# Patient Record
Sex: Female | Born: 1965 | Race: Black or African American | Hispanic: No | Marital: Single | Smoking: Never smoker
Health system: Southern US, Community
[De-identification: ages and names within clinical notes are randomized; demographics above are authoritative.]

## PROBLEM LIST (undated history)

## (undated) DIAGNOSIS — I509 Heart failure, unspecified: Secondary | ICD-10-CM

## (undated) DIAGNOSIS — I10 Essential (primary) hypertension: Secondary | ICD-10-CM

## (undated) DIAGNOSIS — R011 Cardiac murmur, unspecified: Secondary | ICD-10-CM

## (undated) DIAGNOSIS — D689 Coagulation defect, unspecified: Secondary | ICD-10-CM

## (undated) DIAGNOSIS — D649 Anemia, unspecified: Secondary | ICD-10-CM

## (undated) HISTORY — DX: Heart failure, unspecified: I50.9

## (undated) HISTORY — DX: Coagulation defect, unspecified: D68.9

## (undated) HISTORY — DX: Essential (primary) hypertension: I10

## (undated) HISTORY — DX: Anemia, unspecified: D64.9

## (undated) HISTORY — DX: Cardiac murmur, unspecified: R01.1

## (undated) MED FILL — Iron Sucrose Inj 20 MG/ML (Fe Equiv): INTRAVENOUS | Qty: 10 | Status: AC

---

## 2021-02-01 ENCOUNTER — Inpatient Hospital Stay
Admission: EM | Admit: 2021-02-01 | Discharge: 2021-02-10 | DRG: 811 | Disposition: A | Discharge status: Home / Self Care | Payer: Self-pay | Attending: Internal Medicine | Admitting: Internal Medicine

## 2021-02-01 ENCOUNTER — Encounter: Payer: Self-pay | Admitting: Intensive Care

## 2021-02-01 ENCOUNTER — Emergency Department: Payer: Self-pay

## 2021-02-01 ENCOUNTER — Other Ambulatory Visit: Payer: Self-pay

## 2021-02-01 DIAGNOSIS — D509 Iron deficiency anemia, unspecified: Principal | ICD-10-CM | POA: Diagnosis present

## 2021-02-01 DIAGNOSIS — R600 Localized edema: Secondary | ICD-10-CM

## 2021-02-01 DIAGNOSIS — Z20822 Contact with and (suspected) exposure to covid-19: Secondary | ICD-10-CM | POA: Diagnosis present

## 2021-02-01 DIAGNOSIS — R195 Other fecal abnormalities: Secondary | ICD-10-CM

## 2021-02-01 DIAGNOSIS — D696 Thrombocytopenia, unspecified: Secondary | ICD-10-CM | POA: Diagnosis present

## 2021-02-01 DIAGNOSIS — E876 Hypokalemia: Secondary | ICD-10-CM | POA: Diagnosis present

## 2021-02-01 DIAGNOSIS — K7460 Unspecified cirrhosis of liver: Secondary | ICD-10-CM

## 2021-02-01 DIAGNOSIS — R609 Edema, unspecified: Secondary | ICD-10-CM

## 2021-02-01 DIAGNOSIS — I509 Heart failure, unspecified: Secondary | ICD-10-CM

## 2021-02-01 DIAGNOSIS — D6959 Other secondary thrombocytopenia: Secondary | ICD-10-CM | POA: Diagnosis present

## 2021-02-01 DIAGNOSIS — R531 Weakness: Secondary | ICD-10-CM

## 2021-02-01 DIAGNOSIS — I5031 Acute diastolic (congestive) heart failure: Secondary | ICD-10-CM | POA: Diagnosis present

## 2021-02-01 DIAGNOSIS — F1410 Cocaine abuse, uncomplicated: Secondary | ICD-10-CM | POA: Diagnosis present

## 2021-02-01 DIAGNOSIS — Z5900 Homelessness unspecified: Secondary | ICD-10-CM

## 2021-02-01 DIAGNOSIS — Z6833 Body mass index (BMI) 33.0-33.9, adult: Secondary | ICD-10-CM

## 2021-02-01 DIAGNOSIS — R350 Frequency of micturition: Secondary | ICD-10-CM | POA: Diagnosis present

## 2021-02-01 DIAGNOSIS — D649 Anemia, unspecified: Secondary | ICD-10-CM | POA: Diagnosis present

## 2021-02-01 LAB — CBG MONITORING, ED: Glucose-Capillary: 94 mg/dL (ref 70–99)

## 2021-02-01 LAB — LIPASE, BLOOD: Lipase: 38 U/L (ref 11–51)

## 2021-02-01 LAB — RETICULOCYTES
Immature Retic Fract: 27.8 % — ABNORMAL HIGH (ref 2.3–15.9)
RBC.: 2.84 MIL/uL — ABNORMAL LOW (ref 3.87–5.11)
Retic Count, Absolute: 50.6 10*3/uL (ref 19.0–186.0)
Retic Ct Pct: 1.8 % (ref 0.4–3.1)

## 2021-02-01 LAB — CBC
HCT: 19.2 % — ABNORMAL LOW (ref 36.0–46.0)
HCT: 17.5 % — ABNORMAL LOW (ref 36.0–46.0)
Hemoglobin: 5.3 g/dL — ABNORMAL LOW (ref 12.0–15.0)
Hemoglobin: 4.8 g/dL — CL (ref 12.0–15.0)
MCH: 17.8 pg — ABNORMAL LOW (ref 26.0–34.0)
MCH: 17.6 pg — ABNORMAL LOW (ref 26.0–34.0)
MCHC: 27.6 g/dL — ABNORMAL LOW (ref 30.0–36.0)
MCHC: 27.4 g/dL — ABNORMAL LOW (ref 30.0–36.0)
MCV: 64.6 fL — ABNORMAL LOW (ref 80.0–100.0)
MCV: 64.3 fL — ABNORMAL LOW (ref 80.0–100.0)
Platelets: 64 10*3/uL — ABNORMAL LOW (ref 150–400)
Platelets: 101 10*3/uL — ABNORMAL LOW (ref 150–400)
RBC: 2.97 MIL/uL — ABNORMAL LOW (ref 3.87–5.11)
RBC: 2.72 MIL/uL — ABNORMAL LOW (ref 3.87–5.11)
RDW: 31.4 % — ABNORMAL HIGH (ref 11.5–15.5)
RDW: 31.4 % — ABNORMAL HIGH (ref 11.5–15.5)
WBC: 5.8 10*3/uL (ref 4.0–10.5)
WBC: 5.9 10*3/uL (ref 4.0–10.5)
nRBC: 1.4 % — ABNORMAL HIGH (ref 0.0–0.2)
nRBC: 1.2 % — ABNORMAL HIGH (ref 0.0–0.2)

## 2021-02-01 LAB — IRON AND TIBC
Iron: 19 ug/dL — ABNORMAL LOW (ref 28–170)
Saturation Ratios: 4 % — ABNORMAL LOW (ref 10.4–31.8)
TIBC: 508 ug/dL — ABNORMAL HIGH (ref 250–450)
UIBC: 489 ug/dL

## 2021-02-01 LAB — COMPREHENSIVE METABOLIC PANEL
ALT: 13 U/L (ref 0–44)
AST: 20 U/L (ref 15–41)
Albumin: 3.9 g/dL (ref 3.5–5.0)
Alkaline Phosphatase: 39 U/L (ref 38–126)
Anion gap: 8 (ref 5–15)
BUN: 11 mg/dL (ref 6–20)
CO2: 22 mmol/L (ref 22–32)
Calcium: 8.9 mg/dL (ref 8.9–10.3)
Chloride: 108 mmol/L (ref 98–111)
Creatinine, Ser: 0.73 mg/dL (ref 0.44–1.00)
GFR, Estimated: >60 mL/min (ref >60)
Glucose, Bld: 94 mg/dL (ref 70–99)
Potassium: 3.7 mmol/L (ref 3.5–5.1)
Sodium: 138 mmol/L (ref 135–145)
Total Bilirubin: 0.7 mg/dL (ref 0.3–1.2)
Total Protein: 7.5 g/dL (ref 6.5–8.1)

## 2021-02-01 LAB — PROTIME-INR
INR: 1.3 — ABNORMAL HIGH (ref 0.8–1.2)
Prothrombin Time: 15.7 seconds — ABNORMAL HIGH (ref 11.4–15.2)

## 2021-02-01 LAB — TROPONIN I (HIGH SENSITIVITY): Troponin I (High Sensitivity): 8 ng/L (ref <18)

## 2021-02-01 LAB — BRAIN NATRIURETIC PEPTIDE: B Natriuretic Peptide: 334.5 pg/mL — ABNORMAL HIGH (ref 0.0–100.0)

## 2021-02-01 LAB — ABO/RH: ABO/RH(D): B POS

## 2021-02-01 LAB — FERRITIN: Ferritin: 2 ng/mL — ABNORMAL LOW (ref 11–307)

## 2021-02-01 LAB — FOLATE: Folate: 11.7 ng/mL (ref >5.9)

## 2021-02-01 LAB — PREPARE RBC (CROSSMATCH)

## 2021-02-01 MED ORDER — ONDANSETRON HCL 4 MG/2ML IJ SOLN: 4.0000 mg | Freq: Four times a day (QID) | INTRAMUSCULAR | Status: DC | PRN

## 2021-02-01 MED ORDER — PANTOPRAZOLE SODIUM 40 MG IV SOLR
40.0000 mg | Freq: Two times a day (BID) | INTRAVENOUS | Status: DC
  Administered 2021-02-02 – 2021-02-07 (×12): 40 mg via INTRAVENOUS
  Filled 2021-02-01 (×12): qty 40

## 2021-02-01 MED ORDER — ONDANSETRON HCL 4 MG PO TABS: 4.0000 mg | ORAL_TABLET | Freq: Four times a day (QID) | ORAL | Status: DC | PRN

## 2021-02-01 MED ORDER — PANTOPRAZOLE SODIUM 40 MG IV SOLR
40.0000 mg | Freq: Once | INTRAVENOUS | Status: AC
  Administered 2021-02-01: 40 mg via INTRAVENOUS
  Filled 2021-02-01: qty 40

## 2021-02-01 MED ORDER — SODIUM CHLORIDE 0.9% IV SOLUTION
Freq: Once | INTRAVENOUS | Status: AC
  Filled 2021-02-01: qty 250

## 2021-02-01 NOTE — ED Notes (Signed)
Lab coming to recollect green and purple top

## 2021-02-01 NOTE — ED Provider Notes (Signed)
[facility] Emergency Department Provider Note  ____________________________________________  Time seen: Approximately 7:43 PM  I have reviewed the triage vital signs and the nursing notes.   HISTORY  Chief Complaint Foot Swelling    HPI Lauren Hensley is a 55 y.o. female who presents the emergency department for generalized weakness and lower extremity edema.  Patient states that symptoms have been progressing over the past 5 to 6 months.  Patient states that she has slowly had increasing edema both lower extremities.  This is nonpainful.  She states that her legs are now "huge."  Patient has no history of congestive heart failure, is not on a diuretic.  Patient does not weigh herself when eating on a regular basis.  Unsure whether she is been increasing with weight or not.  Patient is also complaining of increasing fatigue.  She states that she is now having difficulty ambulating due to weakness/fatigue.  It is not a true shortness of breath though she states that if she walks any distance she will become short of breath, she states that it is more a tiredness.  She states that she finds that she is sleeping most of the day, sleeps well at night but then will wake up and up through most of the day as well.  Patient denies any dark or tarry stools.  No active visualized         History reviewed. No pertinent past medical history.  Patient Active Problem List   Diagnosis Date Noted  . Symptomatic anemia 02/01/2021    History reviewed. No pertinent surgical history.  Prior to Admission medications   Not on File    Allergies Patient has no known allergies.  History reviewed. No pertinent family history.  Social History Social History   Tobacco Use  . Smoking status: Never Smoker  . Smokeless tobacco: Never Used  Substance Use Topics  . Alcohol use: Yes    Comment: occ  . Drug use: Yes    Types: Marijuana, Cocaine     Review of Systems   Constitutional: No fever/chills.  Positive for generalized weakness Eyes: No visual changes. No discharge ENT: No upper respiratory complaints. Cardiovascular: no chest pain.  Increased peripheral edema Respiratory: no cough. No SOB. Gastrointestinal: No abdominal pain.  No nausea, no vomiting.  No diarrhea.  No constipation. Genitourinary: Negative for dysuria. No hematuria Musculoskeletal: Negative for musculoskeletal pain. Skin: Negative for rash, abrasions, lacerations, ecchymosis. Neurological: Negative for headaches, focal weakness or numbness.  10 System ROS otherwise negative.  ____________________________________________   PHYSICAL EXAM:  VITAL SIGNS: ED Triage Vitals  Enc Vitals Group     BP 02/01/21 1713 (!) 151/87     Pulse Rate 02/01/21 1713 (!) 114     Resp 02/01/21 1713 18     Temp 02/01/21 1715 98 F (36.7 C)     Temp Source 02/01/21 1715 Oral     SpO2 02/01/21 1713 100 %     Weight 02/01/21 1713 230 lb (104.3 kg)     Height 02/01/21 1713 5\' 9"  (1.753 m)     Head Circumference --      Peak Flow --      Pain Score 02/01/21 1713 4     Pain Loc --      Pain Edu? --      Excl. in [place]? --      Constitutional: Alert and oriented. Well appearing and in no acute distress. Eyes: Conjunctivae are normal. PERRL. EOMI. Head: Atraumatic.  ENT:      Ears:       Nose: No congestion/rhinnorhea.      Mouth/Throat: Mucous membranes are moist.  Neck: No stridor.    Cardiovascular: Normal rate, regular rhythm. Normal S1 and S2.  Good peripheral circulation.  Visualization of lower extremities reveals bilateral lower extremity edema, 3+.  Dorsalis pedis pulse appreciated bilateral lower extremity.  Sensation intact and equal bilateral lower extremity. Respiratory: Normal respiratory effort without tachypnea or retractions. Lungs CTAB. Good air entry to the bases with no decreased or absent breath sounds. Musculoskeletal: Full range of motion to all extremities. No gross  deformities appreciated. Neurologic:  Normal speech and language. No gross focal neurologic deficits are appreciated.  Cranial nerves II through XII grossly intact. Skin:  Skin is warm, dry and intact. No rash noted. Psychiatric: Mood and affect are normal. Speech and behavior are normal. Patient exhibits appropriate insight and judgement.   ____________________________________________   LABS (all labs ordered are listed, but only abnormal results are displayed)  Labs Reviewed  CBC - Abnormal; Notable for the following components:      Result Value   RBC 2.97 (*)    Hemoglobin 5.3 (*)    HCT 19.2 (*)    MCV 64.6 (*)    MCH 17.8 (*)    MCHC 27.6 (*)    RDW 31.4 (*)    Platelets 64 (*)    nRBC 1.4 (*)    All other components within normal limits  PROTIME-INR - Abnormal; Notable for the following components:   Prothrombin Time 15.7 (*)    INR 1.3 (*)    All other components within normal limits  COMPREHENSIVE METABOLIC PANEL  LIPASE, BLOOD  URINALYSIS, COMPLETE (UACMP) WITH MICROSCOPIC  URINE DRUG SCREEN, QUALITATIVE ([facility] ONLY)  BRAIN NATRIURETIC PEPTIDE  VITAMIN B12  FOLATE  IRON AND TIBC  FERRITIN  RETICULOCYTES  CBG MONITORING, ED  TYPE AND SCREEN  PREPARE RBC (CROSSMATCH)  ABO/RH  TROPONIN I (HIGH SENSITIVITY)   ____________________________________________  EKG   ____________________________________________  RADIOLOGY I personally viewed and evaluated these images as part of my medical decision making, as well as reviewing the written report by the radiologist.  ED Provider Interpretation: No pulmonary edema identified.  Mild vascular congestion and cardiomegaly.  DG Chest 2 View  Result Date: 02/01/2021 CLINICAL DATA:  Weakness and peripheral edema EXAM: CHEST - 2 VIEW COMPARISON:  None. FINDINGS: Cardiac shadow is enlarged. Mild vascular congestion is noted without parenchymal edema. No sizable effusion is seen. No infiltrate is noted. Degenerative change  of the thoracic spine is seen. IMPRESSION: Mild vascular congestion.  No other focal abnormality is noted. Electronically Signed   By: Inez Catalina M.D.   On: 02/01/2021 20:07    ____________________________________________    PROCEDURES  Procedure(s) performed:    Procedures    Medications  0.9 %  sodium chloride infusion (Manually program via Guardrails IV Fluids) (has no administration in time range)     ____________________________________________   INITIAL IMPRESSION / ASSESSMENT AND PLAN / ED COURSE  Pertinent labs & imaging results that were available during my care of the patient were reviewed by me and considered in my medical decision making (see chart for details).  Review of the [place] CSRS was performed in accordance of the [place] prior to dispensing any controlled drugs.           Patient's diagnosis is consistent with symptomatic anemia, guaiac positive stool, peripheral edema.  Patient presented to emergency  department complaining of generalized weakness, peripheral edema.  Symptoms have been progressing over the past 5 to 6 months.  Patient states that she feels very tired, sleeps through the night, sleeps most of the day as well.  Patient states that in the very remote past when she was a teenager she needed iron supplements due to iron deficiency anemia.  She is not taken supplements and years and has not had any ongoing issues with anemia.  She denied any dark or tarry stools.  No visible blood in her stool.  Patient denied any abdominal pain.  She was also endorsing some increasing lower extremity edema.  No history of CHF.  No diuretic use.  Findings revealed peripheral edema likely concerning for CHF.  Slight cardiomegaly and vascular congestion as well.  No evidence of pulmonary edema.  No concerning EKG or lab changes in regards to the patient's heart.  Patient was found to be anemic at 5.3.  Patient will be transfused here in the emergency department.  She was  guaiac positive but no evidence of frank blood or dark tarry stools on physical exam..  Patient care will be transferred to hospital service at this time for admission for further work-up of symptomatic anemia and peripheral edema.    ____________________________________________  FINAL CLINICAL IMPRESSION(S) / ED DIAGNOSES  Final diagnoses:  Symptomatic anemia  Stool guaiac positive  Peripheral edema      NEW MEDICATIONS STARTED DURING THIS VISIT:  ED Discharge Orders    None          This chart was dictated using voice recognition software/Dragon. Despite best efforts to proofread, errors can occur which can change the meaning. Any change was purely unintentional.    Darletta Moll, PA-C 02/01/21 2114    Duffy Bruce, MD 02/02/21 [phone]

## 2021-02-01 NOTE — H&P (Addendum)
History and Physical   Lauren Hensley WVP:[id number] DOB: May 25, 1966 DOA: 02/01/2021  Referring MD/NP/PA: Dr. Ellender Hose  PCP: Pcp, No   Outpatient Specialists: None  Patient coming from: Home  Chief Complaint: Lower extremity edema and exertional dyspnea  HPI: Lauren Hensley is a 55 y.o. female with medical history significant of no significant past medical history patient has not been seeing any doctors and has not been followed on a regular basis.  Patient presented to the ER with progressive lower extremity edema over the last couple of weeks which has now progressed to shortness of breath initially with exertion but now even at rest.  She has been out progressively weak and tired all the time.  She also has had mild cough.  Patient's family have been pushing her to go see a doctor but has not done so.  She denied any sick contacts.  Patient came to the ER where she was seen and evaluated and found to have a hemoglobin of 5.3.  She also has bilateral lower extremity edema consistent with reported CHF.  Patient found to be occult blood positive with brown stool.  No prior colonoscopies.  She is being admitted for further work-up.  ED Course: Temperature 98 blood pressure 157/89 pulse 140 respirate 23 oxygen sat 98% on room air.  White count is 5.8 hemoglobin 5.3 and platelets 64.  Sodium 138 and the rest of the chemistry appeared to be within normal.  INR 1.3.  Fecal occult blood test was positive.  Patient is initiated on blood transfusion will be admitted to the hospital for further evaluation and treatment.  Review of Systems: As per HPI otherwise 10 point review of systems negative.    History reviewed. No pertinent past medical history.  History reviewed. No pertinent surgical history.   reports that she has never smoked. She has never used smokeless tobacco. She reports current alcohol use. She reports current drug use. Drugs: Marijuana and Cocaine.  No Known Allergies  History reviewed.  No pertinent family history.   Prior to Admission medications   Not on File    Physical Exam: Vitals:   02/01/21 2100 02/01/21 2130 02/01/21 2233 02/01/21 2319  BP: (!) 143/85 (!) 157/89 136/84 (!) 150/88  Pulse: 79 63 79 81  Resp: 20 (!) 21 18 20   Temp:   97.8 F (36.6 C) 98 F (36.7 C)  TempSrc:   Oral Oral  SpO2: 100% 100% 100% 100%  Weight:      Height:          Constitutional: Acutely ill looking, no distress Vitals:   02/01/21 2100 02/01/21 2130 02/01/21 2233 02/01/21 2319  BP: (!) 143/85 (!) 157/89 136/84 (!) 150/88  Pulse: 79 63 79 81  Resp: 20 (!) 21 18 20   Temp:   97.8 F (36.6 C) 98 F (36.7 C)  TempSrc:   Oral Oral  SpO2: 100% 100% 100% 100%  Weight:      Height:       Eyes: PERRL, lids and conjunctivae normal ENMT: Mucous membranes are moist. Posterior pharynx clear of any exudate or lesions.Normal dentition.  Neck: normal, supple, no masses, no thyromegaly Respiratory: clear to auscultation bilaterally, no wheezing, diffuse lower crackles normal respiratory effort. No accessory muscle use.  Cardiovascular: Regular rate and rhythm, no murmurs / rubs / gallops.  2+ peripheral edema. 2+ pedal pulses. No carotid bruits.  Abdomen: no tenderness, no masses palpated. No hepatosplenomegaly. Bowel sounds positive.  Musculoskeletal: no clubbing / cyanosis. No joint  deformity upper and lower extremities. Good ROM, no contractures. Normal muscle tone.  Skin: no rashes, lesions, ulcers. No induration Neurologic: CN 2-12 grossly intact. Sensation intact, DTR normal. Strength 5/5 in all 4.  Psychiatric: Normal judgment and insight. Alert and oriented x 3. Normal mood.     Labs on Admission: I have personally reviewed following labs and imaging studies  CBC: Recent Labs  Lab 02/01/21 1756  WBC 5.8  HGB 5.3*  HCT 19.2*  MCV 64.6*  PLT 64*   Basic Metabolic Panel: Recent Labs  Lab 02/01/21 1756  NA 138  K 3.7  CL 108  CO2 22  GLUCOSE 94  BUN 11   CREATININE 0.73  CALCIUM 8.9   GFR: Estimated Creatinine Clearance: 105.7 mL/min (by C-G formula based on SCr of 0.73 mg/dL). Liver Function Tests: Recent Labs  Lab 02/01/21 1756  AST 20  ALT 13  ALKPHOS 39  BILITOT 0.7  PROT 7.5  ALBUMIN 3.9   Recent Labs  Lab 02/01/21 1756  LIPASE 38   No results for input(s): AMMONIA in the last 168 hours. Coagulation Profile: Recent Labs  Lab 02/01/21 2021  INR 1.3*   Cardiac Enzymes: No results for input(s): CKTOTAL, CKMB, CKMBINDEX, TROPONINI in the last 168 hours. BNP (last 3 results) No results for input(s): PROBNP in the last 8760 hours. HbA1C: No results for input(s): HGBA1C in the last 72 hours. CBG: Recent Labs  Lab 02/01/21 1721  GLUCAP 94   Lipid Profile: No results for input(s): CHOL, HDL, LDLCALC, TRIG, CHOLHDL, LDLDIRECT in the last 72 hours. Thyroid Function Tests: No results for input(s): TSH, T4TOTAL, FREET4, T3FREE, THYROIDAB in the last 72 hours. Anemia Panel: Recent Labs    02/01/21 1756 02/01/21 2021  FOLATE  --  11.7  FERRITIN  --  2*  TIBC  --  508*  IRON  --  19*  RETICCTPCT 1.8  --    Urine analysis: No results found for: COLORURINE, APPEARANCEUR, LABSPEC, PHURINE, GLUCOSEU, HGBUR, BILIRUBINUR, KETONESUR, PROTEINUR, UROBILINOGEN, NITRITE, LEUKOCYTESUR Sepsis Labs: @LABRCNTIP (procalcitonin:4,lacticidven:4) )No results found for this or any previous visit (from the past 240 hour(s)).   Radiological Exams on Admission: DG Chest 2 View  Result Date: 02/01/2021 CLINICAL DATA:  Weakness and peripheral edema EXAM: CHEST - 2 VIEW COMPARISON:  None. FINDINGS: Cardiac shadow is enlarged. Mild vascular congestion is noted without parenchymal edema. No sizable effusion is seen. No infiltrate is noted. Degenerative change of the thoracic spine is seen. IMPRESSION: Mild vascular congestion.  No other focal abnormality is noted. Electronically Signed   By: Inez Catalina M.D.   On: 02/01/2021 20:07       Assessment/Plan Principal Problem:   Symptomatic anemia Active Problems:   Weakness   Acute CHF (congestive heart failure) (HCC)   Guaiac + stool   Thrombocytopenia (HCC)     #1 symptomatic anemia: Most likely secondary to slow blood loss from the GI tract.  Patient also had thrombocytopenia and could be bone marrow related.  Her iron indices were checked showing total iron of only 19.  Is 4% saturation with a ferritin of 2 showing severe iron deficiency.  No heavy periods.  At this point will transfuse 2 units of packed red blood cells.  GI consult.  Initiate IV Protonix.  Differentials include colon cancer diverticular disease and some chronic colitis.  #2 thrombocytopenia: No obvious cause.  Evaluate patient for possible bone marrow disease but less likely.  Her anemia is microcytic consistent with iron deficiency.  MCV of 64.  #3 acute CHF: Evidence of pulmonary edema on chest x-ray.  Will transfuse first.  If patient continues to have respiratory symptoms we will diurese then.  #4 generalized weakness: Most likely due to the anemia.  Transfuse and get PT  #5 guaiac positive stool: Most likely the source of her anemia.  Get GI consult.  Thrombocytopenia may also be the cause of these wet positive stool with brown stool.   DVT prophylaxis: SCD Code Status: Full Family Communication: No family at bedside Disposition Plan: Home Consults called: Gastroenterology, Dr. Haig Prophet Admission status: Inpatient  Severity of Illness: The appropriate patient status for this patient is INPATIENT. Inpatient status is judged to be reasonable and necessary in order to provide the required intensity of service to ensure the patient's safety. The patient's presenting symptoms, physical exam findings, and initial radiographic and laboratory data in the context of their chronic comorbidities is felt to place them at high risk for further clinical deterioration. Furthermore, it is not  anticipated that the patient will be medically stable for discharge from the hospital within 2 midnights of admission. The following factors support the patient status of inpatient.   " The patient's presenting symptoms include generalized weakness with lower extremity edema. " The worrisome physical exam findings include lower extremity edema and pallor. " The initial radiographic and laboratory data are worrisome because of hemoglobin 5.3. " The chronic co-morbidities include none.   * I certify that at the point of admission it is my clinical judgment that the patient will require inpatient hospital care spanning beyond 2 midnights from the point of admission due to high intensity of service, high risk for further deterioration and high frequency of surveillance required.Barbette Merino MD [facility] Pager [phone]  If 7PM-7AM, please contact night-coverage [email or web address] Password [facility]  02/01/2021, 11:29 PM

## 2021-02-01 NOTE — ED Triage Notes (Addendum)
Patient presents with bilateral feet swelling for months. Reports some pain in legs/feet. Has never been seen for this issue. Denies cp or sob. Reports craving ice and drinking lots of water lately. Patient admits to cocaine and marijuana use

## 2021-02-01 NOTE — ED Notes (Signed)
Patient transported to X-ray 

## 2021-02-01 NOTE — ED Notes (Signed)
Attempted IV with no success, type and screen obtained.

## 2021-02-02 ENCOUNTER — Inpatient Hospital Stay: Payer: Self-pay

## 2021-02-02 LAB — COMPREHENSIVE METABOLIC PANEL
ALT: 12 U/L (ref 0–44)
AST: 15 U/L (ref 15–41)
Albumin: 3.3 g/dL — ABNORMAL LOW (ref 3.5–5.0)
Alkaline Phosphatase: 36 U/L — ABNORMAL LOW (ref 38–126)
Anion gap: 7 (ref 5–15)
BUN: 9 mg/dL (ref 6–20)
CO2: 22 mmol/L (ref 22–32)
Calcium: 8.5 mg/dL — ABNORMAL LOW (ref 8.9–10.3)
Chloride: 109 mmol/L (ref 98–111)
Creatinine, Ser: 0.49 mg/dL (ref 0.44–1.00)
GFR, Estimated: >60 mL/min (ref >60)
Glucose, Bld: 89 mg/dL (ref 70–99)
Potassium: 3.4 mmol/L — ABNORMAL LOW (ref 3.5–5.1)
Sodium: 138 mmol/L (ref 135–145)
Total Bilirubin: 2 mg/dL — ABNORMAL HIGH (ref 0.3–1.2)
Total Protein: 6.6 g/dL (ref 6.5–8.1)

## 2021-02-02 LAB — CBC
HCT: 19.9 % — ABNORMAL LOW (ref 36.0–46.0)
HCT: 20.4 % — ABNORMAL LOW (ref 36.0–46.0)
HCT: 21.8 % — ABNORMAL LOW (ref 36.0–46.0)
Hemoglobin: 5.8 g/dL — ABNORMAL LOW (ref 12.0–15.0)
Hemoglobin: 6.2 g/dL — ABNORMAL LOW (ref 12.0–15.0)
Hemoglobin: 6.2 g/dL — ABNORMAL LOW (ref 12.0–15.0)
MCH: 19.9 pg — ABNORMAL LOW (ref 26.0–34.0)
MCH: 20.5 pg — ABNORMAL LOW (ref 26.0–34.0)
MCH: 19.7 pg — ABNORMAL LOW (ref 26.0–34.0)
MCHC: 29.1 g/dL — ABNORMAL LOW (ref 30.0–36.0)
MCHC: 30.4 g/dL (ref 30.0–36.0)
MCHC: 28.4 g/dL — ABNORMAL LOW (ref 30.0–36.0)
MCV: 68.2 fL — ABNORMAL LOW (ref 80.0–100.0)
MCV: 67.5 fL — ABNORMAL LOW (ref 80.0–100.0)
MCV: 69.2 fL — ABNORMAL LOW (ref 80.0–100.0)
Platelets: 98 10*3/uL — ABNORMAL LOW (ref 150–400)
Platelets: 63 10*3/uL — ABNORMAL LOW (ref 150–400)
Platelets: 85 10*3/uL — ABNORMAL LOW (ref 150–400)
RBC: 2.92 MIL/uL — ABNORMAL LOW (ref 3.87–5.11)
RBC: 3.02 MIL/uL — ABNORMAL LOW (ref 3.87–5.11)
RBC: 3.15 MIL/uL — ABNORMAL LOW (ref 3.87–5.11)
RDW: 32.9 % — ABNORMAL HIGH (ref 11.5–15.5)
RDW: 32 % — ABNORMAL HIGH (ref 11.5–15.5)
RDW: 32.1 % — ABNORMAL HIGH (ref 11.5–15.5)
WBC: 6.3 10*3/uL (ref 4.0–10.5)
WBC: 5.7 10*3/uL (ref 4.0–10.5)
WBC: 6.7 10*3/uL (ref 4.0–10.5)
nRBC: 1 % — ABNORMAL HIGH (ref 0.0–0.2)
nRBC: 1.1 % — ABNORMAL HIGH (ref 0.0–0.2)
nRBC: 2.4 % — ABNORMAL HIGH (ref 0.0–0.2)

## 2021-02-02 LAB — PREPARE RBC (CROSSMATCH)

## 2021-02-02 LAB — URINALYSIS, COMPLETE (UACMP) WITH MICROSCOPIC
Bacteria, UA: NONE SEEN
Bilirubin Urine: NEGATIVE
Glucose, UA: NEGATIVE mg/dL
Ketones, ur: NEGATIVE mg/dL
Nitrite: NEGATIVE
Protein, ur: NEGATIVE mg/dL
Specific Gravity, Urine: 1.006 (ref 1.005–1.030)
pH: 6 (ref 5.0–8.0)

## 2021-02-02 LAB — HEPATITIS A ANTIBODY, TOTAL: hep A Total Ab: NONREACTIVE

## 2021-02-02 LAB — URINE DRUG SCREEN, QUALITATIVE (ARMC ONLY)
Amphetamines, Ur Screen: NOT DETECTED
Barbiturates, Ur Screen: NOT DETECTED
Benzodiazepine, Ur Scrn: NOT DETECTED
Cannabinoid 50 Ng, Ur ~~LOC~~: NOT DETECTED
Cocaine Metabolite,Ur ~~LOC~~: POSITIVE — AB
MDMA (Ecstasy)Ur Screen: NOT DETECTED
Methadone Scn, Ur: NOT DETECTED
Opiate, Ur Screen: NOT DETECTED
Phencyclidine (PCP) Ur S: NOT DETECTED
Tricyclic, Ur Screen: NOT DETECTED

## 2021-02-02 LAB — HEPATITIS B CORE ANTIBODY, TOTAL: Hep B Core Total Ab: NONREACTIVE

## 2021-02-02 LAB — HIV ANTIBODY (ROUTINE TESTING W REFLEX): HIV Screen 4th Generation wRfx: NONREACTIVE

## 2021-02-02 LAB — HEPATITIS B SURFACE ANTIGEN: Hepatitis B Surface Ag: NONREACTIVE

## 2021-02-02 LAB — VITAMIN B12: Vitamin B-12: 421 pg/mL (ref 180–914)

## 2021-02-02 MED ORDER — POTASSIUM CHLORIDE CRYS ER 20 MEQ PO TBCR
30.0000 meq | EXTENDED_RELEASE_TABLET | Freq: Once | ORAL | Status: AC
  Administered 2021-02-02: 30 meq via ORAL
  Filled 2021-02-02: qty 1

## 2021-02-02 MED ORDER — PEG 3350-KCL-NA BICARB-NACL 420 G PO SOLR
4000.0000 mL | Freq: Once | ORAL | Status: AC
  Administered 2021-02-02: 4000 mL via ORAL
  Filled 2021-02-02: qty 4000

## 2021-02-02 MED ORDER — SODIUM CHLORIDE 0.9% IV SOLUTION: Freq: Once | INTRAVENOUS | Status: AC

## 2021-02-02 MED ORDER — FUROSEMIDE 10 MG/ML IJ SOLN
20.0000 mg | Freq: Once | INTRAMUSCULAR | Status: AC
  Administered 2021-02-02: 20 mg via INTRAVENOUS
  Filled 2021-02-02: qty 4

## 2021-02-02 NOTE — Plan of Care (Signed)
  Problem: Education: Goal: Ability to identify signs and symptoms of gastrointestinal bleeding will improve Outcome: Progressing   Problem: Bowel/Gastric: Goal: Will show no signs and symptoms of gastrointestinal bleeding Outcome: Progressing   Problem: Fluid Volume: Goal: Will show no signs and symptoms of excessive bleeding Outcome: Progressing   Problem: Clinical Measurements: Goal: Diagnostic test results will improve Outcome: Progressing   Problem: Safety: Goal: Ability to remain free from injury will improve Outcome: Progressing

## 2021-02-02 NOTE — Progress Notes (Signed)
PROGRESS NOTE    Lauren Hensley  JHE:[id number] DOB: 1966-05-19 DOA: 02/01/2021 PCP: Pcp, No   Chief Complaint  Patient presents with  . Foot Swelling  Brief Narrative: 55 year old female no known past medical history but has not been seeing any doctors on regular basis presented to the ED with progressive lower extremity edema for few weeks along with shortness of breath with exertion and at rest progressive weakness.  Family has been pushing her to go to see her doctor about seeing has not done so. In the ED found to have severe microcytic anemia with iron deficiency, thrombocytopenia along with bilateral lower extremity edema, occult blood positive in the stool.  She has had no prior colonoscopies. Patient was admitted for further management transfusion.  Subjective: Seen/examined Received 2 units PRBC overnight, last one at 6.30 am. Cbc pending just now reports stool was positive in rectal exam in ED. Resting well, on RA, no chest pain or shortness of breath at rest.  Assessment & Plan:  Severe microcytic symptomatic anemia Severe iron deficiency anemia-ferritin at 2 Hemoccult positive stool per EDP Received 2 units PRBC overnight, last one at 6.30 am.  Check H&H 12 posttransfusion and transfuse if still < 7 gm. GI is consulted.will likely need further imaging studies-ultrasound abdomen pending.Continue on PPI, CLD. Monitor serila h/h. She still gets monthly menstruation and states she gets somewhat heavy after few days. Denies regular nsaid use, never had egd/colonoscopy.  Repeat H&H came back at 6.2 g we will transfuse 2 additional PRBC, Lasix 20 mg x 1 in between transfusion. Recent Labs  Lab 02/01/21 1756 02/01/21 2304 02/02/21 0502 02/02/21 0844  HGB 5.3* 4.8* 5.8* 6.2*  HCT 19.2* 17.5* 19.9* 20.4*   Generalized weakness  from anemia.  Ambulatory.  Lower extremity swelling for months Dyspnea on exertion: Troponin was negative.  BNP somewhat elevated 334, chest  x-ray-showed mild vascular congestion. Suspect from anemia. Check echo to eval LVEF, to rule out CHF.  Hypokalemia:will replete po. Mildly elevated total bili.  Monitor Urine drug screen positive with cocaine, will need extensive counseling  Thrombocytopenia likely in the setting of iron deficiency: Avoid anticoagulants monitor platelet count  Morbid obesity with BMI 33.  Will benefit weight loss PCP follow-up.  Diet Order            Diet clear liquid Room service appropriate? Yes; Fluid consistency: Thin  Diet effective now                Patient's Body mass index is 33.97 kg/m. DVT prophylaxis: SCDs Start: 02/01/21 2245 Code Status:   Code Status: Full Code  Family Communication: plan of care discussed with patient at bedside.  Status is: Inpatient Remains inpatient appropriate because:Ongoing diagnostic testing needed not appropriate for outpatient work up, IV treatments appropriate due to intensity of illness or inability to take PO and Inpatient level of care appropriate due to severity of illness  Dispo: The patient is from: Home              Anticipated d/c is to: Home              Patient currently is not medically stable to d/c.   Difficult to place patient No Unresulted Labs (From admission, onward)          Start     Ordered   02/03/21 1856  Basic metabolic panel  Daily,   R     Question:  Specimen collection method  Answer:  Lab=Lab collect  02/02/21 0815   02/02/21 1113  Prepare RBC (crossmatch)  (Adult Blood Administration - Red Blood Cells)  Once,   R       Question Answer Comment  # of Units 2 units   Transfusion Indications Symptomatic Anemia   Number of Units to Keep Ahead NO units ahead   If emergent release call blood bank Not emergent release      02/02/21 1113   02/02/21 0900  CBC  Now then every 6 hours,   TIMED     Question:  Specimen collection method  Answer:  Lab=Lab collect   02/02/21 0815        Medications reviewed:  Scheduled  Meds: . sodium chloride   Intravenous Once  . pantoprazole (PROTONIX) IV  40 mg Intravenous Q12H   Continuous Infusions:  Consultants: GI  Procedures:see note  Antimicrobials: Anti-infectives (From admission, onward)   None     Culture/Microbiology No results found for: SDES, SPECREQUEST, CULT, REPTSTATUS  Other culture-see note  Objective: Vitals: Today's Vitals   02/02/21 0318 02/02/21 0457 02/02/21 0637 02/02/21 0835  BP: (!) 150/85 (!) 142/70 (!) 145/96 132/69  Pulse: 82 77 (!) 103 78  Resp: 18 17 18 17   Temp: 98.1 F (36.7 C) 98.6 F (37 C) 98.4 F (36.9 C) 98.7 F (37.1 C)  TempSrc: Oral  Oral   SpO2: 100% 100% 100% 95%  Weight:      Height:      PainSc:        Intake/Output Summary (Last 24 hours) at 02/02/2021 1113 Last data filed at 02/02/2021 1040 Gross per 24 hour  Intake 1200 ml  Output --  Net 1200 ml   Filed Weights   02/01/21 1713  Weight: 104.3 kg   Weight change:   Intake/Output from previous day: 05/07 0701 - 05/08 0700 In: 780 [I.V.:10; Blood:770] Out: -  Intake/Output this shift: Total I/O In: 420 [P.O.:420] Out: -  Filed Weights   02/01/21 1713  Weight: 104.3 kg    Examination: General exam: AAO x3, obese,older than stated age, weak appearing. HEENT:Oral mucosa moist, Ear/Nose WNL grossly,dentition normal. Respiratory system: bilaterally diminished,no crackles no use of accessory muscle, non tender. Cardiovascular system: S1 & S2 +,RRR No JVD. Gastrointestinal system: Abdomen soft, NT,ND, BS+. Nervous System:Alert, awake, moving extremities Extremities:  LE pitting edema, distal peripheral pulses palpable.  Skin: No rashes,no icterus. MSK: Normal muscle bulk,tone, power  Data Reviewed: I have personally reviewed following labs and imaging studies CBC: Recent Labs  Lab 02/01/21 1756 02/01/21 2304 02/02/21 0502 02/02/21 0844  WBC 5.8 5.9 6.3 5.7  HGB 5.3* 4.8* 5.8* 6.2*  HCT 19.2* 17.5* 19.9* 20.4*  MCV 64.6* 64.3*  68.2* 67.5*  PLT 64* 101* 98* 63*   Basic Metabolic Panel: Recent Labs  Lab 02/01/21 1756 02/02/21 0502  NA 138 138  K 3.7 3.4*  CL 108 109  CO2 22 22  GLUCOSE 94 89  BUN 11 9  CREATININE 0.73 0.49  CALCIUM 8.9 8.5*   GFR: Estimated Creatinine Clearance: 105.7 mL/min (by C-G formula based on SCr of 0.49 mg/dL). Liver Function Tests: Recent Labs  Lab 02/01/21 1756 02/02/21 0502  AST 20 15  ALT 13 12  ALKPHOS 39 36*  BILITOT 0.7 2.0*  PROT 7.5 6.6  ALBUMIN 3.9 3.3*   Recent Labs  Lab 02/01/21 1756  LIPASE 38   No results for input(s): AMMONIA in the last 168 hours. Coagulation Profile: Recent Labs  Lab 02/01/21 2021  INR 1.3*   Cardiac Enzymes: No results for input(s): CKTOTAL, CKMB, CKMBINDEX, TROPONINI in the last 168 hours. BNP (last 3 results) No results for input(s): PROBNP in the last 8760 hours. HbA1C: No results for input(s): HGBA1C in the last 72 hours. CBG: Recent Labs  Lab 02/01/21 1721  GLUCAP 94   Lipid Profile: No results for input(s): CHOL, HDL, LDLCALC, TRIG, CHOLHDL, LDLDIRECT in the last 72 hours. Thyroid Function Tests: No results for input(s): TSH, T4TOTAL, FREET4, T3FREE, THYROIDAB in the last 72 hours. Anemia Panel: Recent Labs    02/01/21 1756 02/01/21 2021  VITAMINB12 421  --   FOLATE  --  11.7  FERRITIN  --  2*  TIBC  --  508*  IRON  --  19*  RETICCTPCT 1.8  --    Sepsis Labs: No results for input(s): PROCALCITON, LATICACIDVEN in the last 168 hours.  No results found for this or any previous visit (from the past 240 hour(s)).   Radiology Studies: DG Chest 2 View  Result Date: 02/01/2021 CLINICAL DATA:  Weakness and peripheral edema EXAM: CHEST - 2 VIEW COMPARISON:  None. FINDINGS: Cardiac shadow is enlarged. Mild vascular congestion is noted without parenchymal edema. No sizable effusion is seen. No infiltrate is noted. Degenerative change of the thoracic spine is seen. IMPRESSION: Mild vascular congestion.  No other  focal abnormality is noted. Electronically Signed   By: Inez Catalina M.D.   On: 02/01/2021 20:07   US Abdomen Limited RUQ (LIVER/GB)  Result Date: 02/02/2021 CLINICAL DATA:  Cirrhosis. EXAM: ULTRASOUND ABDOMEN LIMITED RIGHT UPPER QUADRANT COMPARISON:  None. FINDINGS: Gallbladder: Multiple stones, largest 2.4 cm. Probable intervening sludge. Mild area wall prominence up to 4 mm, remainder of the fall normal in thickness. No pericholecystic fluid. No sonographic Murphy's sign. Common bile duct: Diameter: 3 mm Liver: Normal size. Coarsened echotexture with mild generalized increased parenchymal echogenicity. No masses. Portal vein is patent on color Doppler imaging with normal direction of blood flow towards the liver. Other: None. IMPRESSION: 1. No acute findings. 2. Coarsened liver echotexture with mild generalized increased liver parenchymal echogenicity. Findings consistent with the history of cirrhosis likely with a component of hepatic steatosis. No liver masses. 3. Multiple gallstones.  No acute cholecystitis. Electronically Signed   By: Lajean Manes M.D.   On: 02/02/2021 10:00     LOS: 1 day   Antonieta Pert, MD [facility]  02/02/2021, 11:13 AM

## 2021-02-02 NOTE — Consult Note (Signed)
Consultation  Referring Provider:  Hospitalist Admit date: 5/7 Consult date: 5/8         Reason for Consultation:     IDA         HPI:   Lauren Hensley is a 55 y.o. lady who's initial complaint was lower extremity edema but on presentation was found to have severe iron deficiency anemia. She doesn't visit doctors so known medical history but she has history of insecure housing with drug use including cocaine about two days ago. Her lower extremities have been swelling for the past few months but she has not sought medical attention until now. No significant NSAID use. Rare alcohol use. Does not donate blood. She does note over the past 3-4 years of having heavy menstrual bleeding. She has a history of anemia when her son who is now 42 was born and was on PO iron but no follow up was done. No family history of GI malignancies. No blood thinners. No abdominal surgeries. No overt GI bleeding.  History reviewed. No pertinent past medical history.  History reviewed. No pertinent surgical history.  History reviewed. No pertinent family history.   Social History   Tobacco Use  . Smoking status: Never Smoker  . Smokeless tobacco: Never Used  Substance Use Topics  . Alcohol use: Yes    Comment: occ  . Drug use: Yes    Types: Marijuana, Cocaine    Prior to Admission medications   Not on File    Current Facility-Administered Medications  Medication Dose Route Frequency Provider Last Rate Last Admin  . 0.9 %  sodium chloride infusion (Manually program via Guardrails IV Fluids)   Intravenous Once Kc, Ramesh, MD      . furosemide (LASIX) injection 20 mg  20 mg Intravenous Once Kc, Ramesh, MD      . ondansetron (ZOFRAN) tablet 4 mg  4 mg Oral Q6H PRN Elwyn Reach, MD       Or  . ondansetron (ZOFRAN) injection 4 mg  4 mg Intravenous Q6H PRN Gala Romney L, MD      . pantoprazole (PROTONIX) injection 40 mg  40 mg Intravenous Q12H Gala Romney L, MD   40 mg at 02/02/21 1011     Allergies as of 02/01/2021  . (No Known Allergies)     Review of Systems:    All systems reviewed and negative except where noted in HPI.  Review of Systems  Constitutional: Positive for malaise/fatigue. Negative for chills and fever.  Respiratory: Positive for shortness of breath.   Cardiovascular: Positive for leg swelling. Negative for chest pain.  Gastrointestinal: Negative for abdominal pain, blood in stool, constipation, diarrhea, nausea and vomiting.  Genitourinary: Negative for dysuria.  Musculoskeletal: Negative for joint pain.  Skin: Negative for itching and rash.  Psychiatric/Behavioral: Positive for substance abuse.  All other systems reviewed and are negative.     Physical Exam:  Vital signs in last 24 hours: Temp:  [97.8 F (36.6 C)-98.7 F (37.1 C)] 98.7 F (37.1 C) (05/08 0835) Pulse Rate:  [63-114] 78 (05/08 0835) Resp:  [17-23] 17 (05/08 0835) BP: (132-157)/(69-104) 132/69 (05/08 0835) SpO2:  [95 %-100 %] 95 % (05/08 0835) Weight:  [104.3 kg] 104.3 kg (05/07 1713) Last BM Date: 01/31/21 General:   Pleasant in NAD Head:  Normocephalic and atraumatic. Eyes:   No icterus.   Conjunctiva pink. Mouth: Mucosa pink moist, no lesions. Neck:  Supple Lungs:  No respiratory distress Heart:  RRR Abdomen:   Non-tender,  non-distended Rectal:  Not performed.  Msk: No clubbing or cyanosis. Strength 5/5. Symmetrical without gross deformities. 1+ edema Neurologic:  Alert and  oriented x4;  Cranial nerves II-XII intact.  Skin:  Warm, dry, pink without significant lesions or rashes. Psych:  Alert and cooperative. Normal affect.  LAB RESULTS: Recent Labs    02/01/21 2304 02/02/21 0502 02/02/21 0844  WBC 5.9 6.3 5.7  HGB 4.8* 5.8* 6.2*  HCT 17.5* 19.9* 20.4*  PLT 101* 98* 63*   BMET Recent Labs    02/01/21 1756 02/02/21 0502  NA 138 138  K 3.7 3.4*  CL 108 109  CO2 22 22  GLUCOSE 94 89  BUN 11 9  CREATININE 0.73 0.49  CALCIUM 8.9 8.5*    LFT Recent Labs    02/02/21 0502  PROT 6.6  ALBUMIN 3.3*  AST 15  ALT 12  ALKPHOS 36*  BILITOT 2.0*   PT/INR Recent Labs    02/01/21 2021  LABPROT 15.7*  INR 1.3*    STUDIES: DG Chest 2 View  Result Date: 02/01/2021 CLINICAL DATA:  Weakness and peripheral edema EXAM: CHEST - 2 VIEW COMPARISON:  None. FINDINGS: Cardiac shadow is enlarged. Mild vascular congestion is noted without parenchymal edema. No sizable effusion is seen. No infiltrate is noted. Degenerative change of the thoracic spine is seen. IMPRESSION: Mild vascular congestion.  No other focal abnormality is noted. Electronically Signed   By: Inez Catalina M.D.   On: 02/01/2021 20:07   US Abdomen Limited RUQ (LIVER/GB)  Result Date: 02/02/2021 CLINICAL DATA:  Cirrhosis. EXAM: ULTRASOUND ABDOMEN LIMITED RIGHT UPPER QUADRANT COMPARISON:  None. FINDINGS: Gallbladder: Multiple stones, largest 2.4 cm. Probable intervening sludge. Mild area wall prominence up to 4 mm, remainder of the fall normal in thickness. No pericholecystic fluid. No sonographic Murphy's sign. Common bile duct: Diameter: 3 mm Liver: Normal size. Coarsened echotexture with mild generalized increased parenchymal echogenicity. No masses. Portal vein is patent on color Doppler imaging with normal direction of blood flow towards the liver. Other: None. IMPRESSION: 1. No acute findings. 2. Coarsened liver echotexture with mild generalized increased liver parenchymal echogenicity. Findings consistent with the history of cirrhosis likely with a component of hepatic steatosis. No liver masses. 3. Multiple gallstones.  No acute cholecystitis. Electronically Signed   By: Lajean Manes M.D.   On: 02/02/2021 10:00       Impression / Plan:   55 y/o lady with history of drug use who presents with IDA and thrombocytopenia with chief complaint of lower extremity swelling. Given biochemical numbers and history, she is at risk for cirrhosis. Differential for IDA includes  portal hypertensive gastropathy, PUD, GI malignancy, and even uterine malignancy  - recommend transfusion of pRBC if hemoglobin < 7. Given no overt GI bleeding, can check CBC's BID - would check hepatitis C, hep b, and hep a serologies (ordered) - would consider uterine/ovarian cancer evaluation given heavy vaginal bleeding - can continue PPI IV BID for now - clear liquid diet - will plan on EGD/Colonoscopy tomorrow - agree with cardiac echo - NPO except for meds and prep at midnight - check daily CMP and INR  Please call with any questions or concerns  Raylene Miyamoto MD, MPH [place]

## 2021-02-03 ENCOUNTER — Inpatient Hospital Stay
Admit: 2021-02-03 | Discharge: 2021-02-03 | Disposition: A | Discharge status: Home / Self Care | Payer: Self-pay | Attending: Internal Medicine | Admitting: Internal Medicine

## 2021-02-03 ENCOUNTER — Encounter: Payer: Self-pay | Admitting: Anesthesiology

## 2021-02-03 ENCOUNTER — Encounter
Admission: EM | Disposition: A | Discharge status: Home / Self Care | Payer: Self-pay | Source: Home / Self Care | Attending: Internal Medicine

## 2021-02-03 LAB — TYPE AND SCREEN
ABO/RH(D): B POS
Antibody Screen: NEGATIVE
Unit division: 0
Unit division: 0
Unit division: 0
Unit division: 0

## 2021-02-03 LAB — CBC
HCT: 26.1 % — ABNORMAL LOW (ref 36.0–46.0)
HCT: 28.3 % — ABNORMAL LOW (ref 36.0–46.0)
Hemoglobin: 7.9 g/dL — ABNORMAL LOW (ref 12.0–15.0)
Hemoglobin: 8.5 g/dL — ABNORMAL LOW (ref 12.0–15.0)
MCH: 21.4 pg — ABNORMAL LOW (ref 26.0–34.0)
MCH: 21.6 pg — ABNORMAL LOW (ref 26.0–34.0)
MCHC: 30 g/dL (ref 30.0–36.0)
MCHC: 30.3 g/dL (ref 30.0–36.0)
MCV: 71.3 fL — ABNORMAL LOW (ref 80.0–100.0)
MCV: 71.3 fL — ABNORMAL LOW (ref 80.0–100.0)
Platelets: 145 10*3/uL — ABNORMAL LOW (ref 150–400)
Platelets: 148 10*3/uL — ABNORMAL LOW (ref 150–400)
RBC: 3.66 MIL/uL — ABNORMAL LOW (ref 3.87–5.11)
RBC: 3.97 MIL/uL (ref 3.87–5.11)
WBC: 6.3 10*3/uL (ref 4.0–10.5)
WBC: 7.7 10*3/uL (ref 4.0–10.5)
nRBC: 1.1 % — ABNORMAL HIGH (ref 0.0–0.2)
nRBC: 1.6 % — ABNORMAL HIGH (ref 0.0–0.2)

## 2021-02-03 LAB — URINE DRUG SCREEN, QUALITATIVE (ARMC ONLY)
Amphetamines, Ur Screen: NOT DETECTED
Barbiturates, Ur Screen: NOT DETECTED
Benzodiazepine, Ur Scrn: NOT DETECTED
Cannabinoid 50 Ng, Ur ~~LOC~~: NOT DETECTED
Cocaine Metabolite,Ur ~~LOC~~: POSITIVE — AB
MDMA (Ecstasy)Ur Screen: NOT DETECTED
Methadone Scn, Ur: NOT DETECTED
Opiate, Ur Screen: NOT DETECTED
Phencyclidine (PCP) Ur S: NOT DETECTED
Tricyclic, Ur Screen: NOT DETECTED

## 2021-02-03 LAB — BPAM RBC
Blood Product Expiration Date: 202205282359
Blood Product Expiration Date: 202205302359
Blood Product Expiration Date: 202205302359
Blood Product Expiration Date: 202206042359
ISSUE DATE / TIME: 202205072311
ISSUE DATE / TIME: 202205080251
ISSUE DATE / TIME: 202205081452
ISSUE DATE / TIME: 202205081734
Unit Type and Rh: 5100
Unit Type and Rh: 7300
Unit Type and Rh: 7300
Unit Type and Rh: 7300

## 2021-02-03 LAB — RESP PANEL BY RT-PCR (FLU A&B, COVID) ARPGX2
Influenza A by PCR: NEGATIVE
Influenza B by PCR: NEGATIVE
SARS Coronavirus 2 by RT PCR: NEGATIVE

## 2021-02-03 LAB — HCV AB W REFLEX TO QUANT PCR: HCV Ab: <0.1 s/co ratio (ref 0.0–0.9)

## 2021-02-03 LAB — BASIC METABOLIC PANEL
Anion gap: 6 (ref 5–15)
BUN: 6 mg/dL (ref 6–20)
CO2: 24 mmol/L (ref 22–32)
Calcium: 8.9 mg/dL (ref 8.9–10.3)
Chloride: 109 mmol/L (ref 98–111)
Creatinine, Ser: 0.77 mg/dL (ref 0.44–1.00)
GFR, Estimated: >60 mL/min (ref >60)
Glucose, Bld: 85 mg/dL (ref 70–99)
Potassium: 3.4 mmol/L — ABNORMAL LOW (ref 3.5–5.1)
Sodium: 139 mmol/L (ref 135–145)

## 2021-02-03 LAB — HCV INTERPRETATION

## 2021-02-03 SURGERY — EGD (ESOPHAGOGASTRODUODENOSCOPY)
Anesthesia: General

## 2021-02-03 MED ORDER — BENZOCAINE 10 % MT GEL
Freq: Four times a day (QID) | OROMUCOSAL | Status: DC | PRN
  Administered 2021-02-03 – 2021-02-06 (×3): 1 via OROMUCOSAL
  Filled 2021-02-03: qty 9

## 2021-02-03 MED ORDER — POTASSIUM CHLORIDE 10 MEQ/100ML IV SOLN
10.0000 meq | INTRAVENOUS | Status: AC
  Administered 2021-02-03 (×2): 10 meq via INTRAVENOUS
  Filled 2021-02-03 (×2): qty 100

## 2021-02-03 NOTE — Care Plan (Signed)
Anesthesia requiring urine to be clear of cocaine. Can restart regular diet but place back on clear liquids tomorrow morning. Will need drug screen re-ordered for tomorrow.  Raylene Miyamoto MD, MPH [place]

## 2021-02-03 NOTE — Plan of Care (Signed)
  Problem: Education: Goal: Ability to identify signs and symptoms of gastrointestinal bleeding will improve Outcome: Not Progressing   Problem: Bowel/Gastric: Goal: Will show no signs and symptoms of gastrointestinal bleeding Outcome: Not Progressing   Problem: Fluid Volume: Goal: Will show no signs and symptoms of excessive bleeding Outcome: Not Progressing   Problem: Clinical Measurements: Goal: Complications related to the disease process, condition or treatment will be avoided or minimized Outcome: Not Progressing   Problem: Education: Goal: Knowledge of General Education information will improve Description: Including pain rating scale, medication(s)/side effects and non-pharmacologic comfort measures Outcome: Not Progressing   Problem: Health Behavior/Discharge Planning: Goal: Ability to manage health-related needs will improve Outcome: Not Progressing   Problem: Clinical Measurements: Goal: Ability to maintain clinical measurements within normal limits will improve Outcome: Not Progressing Goal: Will remain free from infection Outcome: Not Progressing Goal: Diagnostic test results will improve Outcome: Not Progressing Goal: Respiratory complications will improve Outcome: Not Progressing Goal: Cardiovascular complication will be avoided Outcome: Not Progressing   Problem: Activity: Goal: Risk for activity intolerance will decrease Outcome: Not Progressing   Problem: Nutrition: Goal: Adequate nutrition will be maintained Outcome: Not Progressing   Problem: Coping: Goal: Level of anxiety will decrease Outcome: Not Progressing   Problem: Elimination: Goal: Will not experience complications related to bowel motility Outcome: Not Progressing Goal: Will not experience complications related to urinary retention Outcome: Not Progressing   Problem: Pain Managment: Goal: General experience of comfort will improve Outcome: Not Progressing   Problem:  Safety: Goal: Ability to remain free from injury will improve Outcome: Not Progressing   Problem: Skin Integrity: Goal: Risk for impaired skin integrity will decrease Outcome: Not Progressing

## 2021-02-03 NOTE — Progress Notes (Signed)
PROGRESS NOTE    Lauren Hensley  ZOX:[id number] DOB: Oct 30, 1965 DOA: 02/01/2021 PCP: Pcp, No   Chief Complaint  Patient presents with  . Foot Swelling  Brief Narrative: 55 year old female no known past medical history but has not been seeing any doctors on regular basis presented to the ED with progressive lower extremity edema for few weeks along with shortness of breath with exertion and at rest progressive weakness.  Family has been pushing her to go to see her doctor about seeing has not done so. In the ED found to have severe microcytic anemia with iron deficiency, thrombocytopenia along with bilateral lower extremity edema, occult blood positive in the stool.  She has had no prior colonoscopies. Patient was admitted for further management transfusion.  Subjective:  Tolerated PRBC transfusion.  Hemodynamically stable overnight. Has no new complaints. Awaiting for echocardiogram this morning and further procedures by GI Denies chest pain or shortness of breath at rest.  Assessment & Plan:  Severe microcytic symptomatic anemia Severe iron deficiency anemia-ferritin at 2 Hemoccult positive stool per EDP Status post 4 units PRBC transfusion since admission, with 1 dose of Lasix. H&H has appropriately increased 7.9 g this morning.Anemia panel with severely low ferritin at 2, w/ normal W11 and folic acid.  Patient does complains of heavy menstrual periods-will need OB/GYN eval at somepoint. Awaiting on EGD/COLOnoscopy  today 02/03/21.GI following closely appreciate inputs.Hepatitis serology NL Recent Labs  Lab 02/02/21 0502 02/02/21 0844 02/02/21 1445 02/02/21 2341 02/03/21 0430  HGB 5.8* 6.2* 6.2* 8.5* 7.9*  HCT 19.9* 20.4* 21.8* 28.3* 26.1*   Generalized weakness  from anemia.  She has been ambulatory.  Lower extremity swelling for months Dyspnea on exertion: Suspect this is due to her severe anemia.Troponin was negative.BNP somewhat elevated 334, chest x-ray-showed mild  vascular congestion. Echo pending to evaluate LVEF, to rule out CHF.  Hypokalemia: Replete p.o.x1.   Mildly elevated total bili.Monitor. Urine drug screen positive with cocaine,will need extensive counseling. HR stable. BP on higher side, monitor. Thrombocytopenia likely in the setting of iron deficiency Versus acute illness.Platelet count has improved. Recent Labs  Lab 02/02/21 0502 02/02/21 0844 02/02/21 1445 02/02/21 2341 02/03/21 0430  HGB 5.8* 6.2* 6.2* 8.5* 7.9*  HCT 19.9* 20.4* 21.8* 28.3* 26.1*   Morbid obesity with BMI 33:Will benefit weight loss PCP follow-up.  Diet Order            Diet clear liquid Room service appropriate? Yes; Fluid consistency: Thin  Diet effective now                Patient's Body mass index is 33.97 kg/m. DVT prophylaxis: SCDs Start: 02/01/21 2245 Code Status:   Code Status: Full Code  Family Communication: plan of care discussed with patient at bedside.  Status is: Inpatient Remains inpatient appropriate because:Ongoing diagnostic testing needed not appropriate for outpatient work up, IV treatments appropriate due to intensity of illness or inability to take PO and Inpatient level of care appropriate due to severity of illness  Dispo: The patient is from: Home              Anticipated d/c is to: Home once H&H stable anemia and GI work-up completed              Patient currently is not medically stable to d/c.   Difficult to place patient No Unresulted Labs (From admission, onward)          Start     Ordered   02/03/21 [phone]  Urine Drug Screen, Qualitative ([place] only)  ONCE - STAT,   STAT        02/03/21 0754   02/03/21 6144  Basic metabolic panel  Daily,   R     Question:  Specimen collection method  Answer:  Lab=Lab collect   02/02/21 0815   02/03/21 0500  CBC  Daily,   R     Question:  Specimen collection method  Answer:  Lab=Lab collect   02/02/21 1743   02/02/21 1135  HCV Ab Reflex to Quant PCR  Once,   R        02/02/21 1134    02/02/21 1135  Hepatitis B surface antibody  Once,   R        02/02/21 1134        Medications reviewed:  Scheduled Meds: . pantoprazole (PROTONIX) IV  40 mg Intravenous Q12H   Continuous Infusions:  Consultants: GI  Procedures:see note  Antimicrobials: Anti-infectives (From admission, onward)   None     Culture/Microbiology No results found for: SDES, SPECREQUEST, CULT, REPTSTATUS  Other culture-see note  Objective: Vitals: Today's Vitals   02/02/21 1920 02/02/21 2019 02/02/21 2048 02/03/21 0448  BP:  132/79 (!) 147/91 (!) 145/69  Pulse:  62 67 92  Resp:  18 20 20   Temp:  98.4 F (36.9 C) 98.1 F (36.7 C) 98.3 F (36.8 C)  TempSrc:      SpO2:  100% 100% 100%  Weight:      Height:      PainSc: 0-No pain       Intake/Output Summary (Last 24 hours) at 02/03/2021 0755 Last data filed at 02/02/2021 2038 Gross per 24 hour  Intake 3122 ml  Output --  Net 3122 ml   Filed Weights   02/01/21 1713  Weight: 104.3 kg   Weight change:   Intake/Output from previous day: 05/08 0701 - 05/09 0700 In: 3122 [P.O.:1980; Blood:1142] Out: -  Intake/Output this shift: No intake/output data recorded. Filed Weights   02/01/21 1713  Weight: 104.3 kg    Examination: general exam: AAOx 3 older than stated age, obese HEENT:Oral mucosa moist, Ear/Nose WNL grossly, dentition normal. Respiratory system: bilaterally diminished, , no use of accessory muscle Cardiovascular system: S1 & S2 +, No JVD,. Gastrointestinal system: Abdomen soft,NT,ND, BS+ Nervous System:Alert, awake, moving extremities and grossly nonfocal Extremities: no edema, distal peripheral pulses palpable.  Skin: No rashes,no icterus. MSK: Normal muscle bulk,tone, power  Data Reviewed: I have personally reviewed following labs and imaging studies CBC: Recent Labs  Lab 02/02/21 0502 02/02/21 0844 02/02/21 1445 02/02/21 2341 02/03/21 0430  WBC 6.3 5.7 6.7 7.7 6.3  HGB 5.8* 6.2* 6.2* 8.5* 7.9*  HCT 19.9*  20.4* 21.8* 28.3* 26.1*  MCV 68.2* 67.5* 69.2* 71.3* 71.3*  PLT 98* 63* 85* 145* 315*   Basic Metabolic Panel: Recent Labs  Lab 02/01/21 1756 02/02/21 0502 02/03/21 0430  NA 138 138 139  K 3.7 3.4* 3.4*  CL 108 109 109  CO2 22 22 24   GLUCOSE 94 89 85  BUN 11 9 6   CREATININE 0.73 0.49 0.77  CALCIUM 8.9 8.5* 8.9   GFR: Estimated Creatinine Clearance: 105.7 mL/min (by C-G formula based on SCr of 0.77 mg/dL). Liver Function Tests: Recent Labs  Lab 02/01/21 1756 02/02/21 0502  AST 20 15  ALT 13 12  ALKPHOS 39 36*  BILITOT 0.7 2.0*  PROT 7.5 6.6  ALBUMIN 3.9 3.3*   Recent Labs  Lab 02/01/21 1756  LIPASE 38   No results for input(s): AMMONIA in the last 168 hours. Coagulation Profile: Recent Labs  Lab 02/01/21 2021  INR 1.3*   Cardiac Enzymes: No results for input(s): CKTOTAL, CKMB, CKMBINDEX, TROPONINI in the last 168 hours. BNP (last 3 results) No results for input(s): PROBNP in the last 8760 hours. HbA1C: No results for input(s): HGBA1C in the last 72 hours. CBG: Recent Labs  Lab 02/01/21 1721  GLUCAP 94   Lipid Profile: No results for input(s): CHOL, HDL, LDLCALC, TRIG, CHOLHDL, LDLDIRECT in the last 72 hours. Thyroid Function Tests: No results for input(s): TSH, T4TOTAL, FREET4, T3FREE, THYROIDAB in the last 72 hours. Anemia Panel: Recent Labs    02/01/21 1756 02/01/21 2021  VITAMINB12 421  --   FOLATE  --  11.7  FERRITIN  --  2*  TIBC  --  508*  IRON  --  19*  RETICCTPCT 1.8  --    Sepsis Labs: No results for input(s): PROCALCITON, LATICACIDVEN in the last 168 hours.  No results found for this or any previous visit (from the past 240 hour(s)).   Radiology Studies: DG Chest 2 View  Result Date: 02/01/2021 CLINICAL DATA:  Weakness and peripheral edema EXAM: CHEST - 2 VIEW COMPARISON:  None. FINDINGS: Cardiac shadow is enlarged. Mild vascular congestion is noted without parenchymal edema. No sizable effusion is seen. No infiltrate is noted.  Degenerative change of the thoracic spine is seen. IMPRESSION: Mild vascular congestion.  No other focal abnormality is noted. Electronically Signed   By: Inez Catalina M.D.   On: 02/01/2021 20:07   US Abdomen Limited RUQ (LIVER/GB)  Result Date: 02/02/2021 CLINICAL DATA:  Cirrhosis. EXAM: ULTRASOUND ABDOMEN LIMITED RIGHT UPPER QUADRANT COMPARISON:  None. FINDINGS: Gallbladder: Multiple stones, largest 2.4 cm. Probable intervening sludge. Mild area wall prominence up to 4 mm, remainder of the fall normal in thickness. No pericholecystic fluid. No sonographic Murphy's sign. Common bile duct: Diameter: 3 mm Liver: Normal size. Coarsened echotexture with mild generalized increased parenchymal echogenicity. No masses. Portal vein is patent on color Doppler imaging with normal direction of blood flow towards the liver. Other: None. IMPRESSION: 1. No acute findings. 2. Coarsened liver echotexture with mild generalized increased liver parenchymal echogenicity. Findings consistent with the history of cirrhosis likely with a component of hepatic steatosis. No liver masses. 3. Multiple gallstones.  No acute cholecystitis. Electronically Signed   By: Lajean Manes M.D.   On: 02/02/2021 10:00     LOS: 2 days   Antonieta Pert, MD [facility]  02/03/2021, 7:55 AM

## 2021-02-03 NOTE — Plan of Care (Signed)
  Problem: Education: Goal: Ability to identify signs and symptoms of gastrointestinal bleeding will improve Outcome: Progressing   Problem: Clinical Measurements: Goal: Complications related to the disease process, condition or treatment will be avoided or minimized Outcome: Progressing   Problem: Education: Goal: Knowledge of General Education information will improve Description: Including pain rating scale, medication(s)/side effects and non-pharmacologic comfort measures Outcome: Progressing   Problem: Health Behavior/Discharge Planning: Goal: Ability to manage health-related needs will improve Outcome: Progressing   Problem: Coping: Goal: Level of anxiety will decrease Outcome: Progressing   Problem: Safety: Goal: Ability to remain free from injury will improve Outcome: Progressing   Problem: Skin Integrity: Goal: Risk for impaired skin integrity will decrease Outcome: Progressing

## 2021-02-03 NOTE — Progress Notes (Signed)
*  PRELIMINARY RESULTS* Echocardiogram 2D Echocardiogram has been performed.  Lauren Hensley [name] 02/03/2021, 12:13 PM

## 2021-02-04 LAB — URINE DRUG SCREEN, QUALITATIVE (ARMC ONLY)
Amphetamines, Ur Screen: NOT DETECTED
Barbiturates, Ur Screen: NOT DETECTED
Benzodiazepine, Ur Scrn: NOT DETECTED
Cannabinoid 50 Ng, Ur ~~LOC~~: NOT DETECTED
Cocaine Metabolite,Ur ~~LOC~~: POSITIVE — AB
MDMA (Ecstasy)Ur Screen: NOT DETECTED
Methadone Scn, Ur: NOT DETECTED
Opiate, Ur Screen: NOT DETECTED
Phencyclidine (PCP) Ur S: NOT DETECTED
Tricyclic, Ur Screen: NOT DETECTED

## 2021-02-04 LAB — ECHOCARDIOGRAM COMPLETE
AR max vel: 3.07 cm2
AV Area VTI: 3.52 cm2
AV Area mean vel: 2.97 cm2
AV Mean grad: 3 mmHg
AV Peak grad: 6.1 mmHg
Ao pk vel: 1.23 m/s
Area-P 1/2: 6.43 cm2
Height: 69 in
MV VTI: 2.64 cm2
S' Lateral: 4 cm
Weight: 3680 oz

## 2021-02-04 LAB — CBC
HCT: 26.8 % — ABNORMAL LOW (ref 36.0–46.0)
Hemoglobin: 8.2 g/dL — ABNORMAL LOW (ref 12.0–15.0)
MCH: 21.6 pg — ABNORMAL LOW (ref 26.0–34.0)
MCHC: 30.6 g/dL (ref 30.0–36.0)
MCV: 70.5 fL — ABNORMAL LOW (ref 80.0–100.0)
Platelets: 220 10*3/uL (ref 150–400)
RBC: 3.8 MIL/uL — ABNORMAL LOW (ref 3.87–5.11)
WBC: 7.7 10*3/uL (ref 4.0–10.5)
nRBC: 0.6 % — ABNORMAL HIGH (ref 0.0–0.2)

## 2021-02-04 LAB — BASIC METABOLIC PANEL
Anion gap: 6 (ref 5–15)
BUN: 7 mg/dL (ref 6–20)
CO2: 24 mmol/L (ref 22–32)
Calcium: 8.7 mg/dL — ABNORMAL LOW (ref 8.9–10.3)
Chloride: 108 mmol/L (ref 98–111)
Creatinine, Ser: 0.83 mg/dL (ref 0.44–1.00)
GFR, Estimated: >60 mL/min (ref >60)
Glucose, Bld: 93 mg/dL (ref 70–99)
Potassium: 4.1 mmol/L (ref 3.5–5.1)
Sodium: 138 mmol/L (ref 135–145)

## 2021-02-04 LAB — HEPATITIS B SURFACE ANTIBODY, QUANTITATIVE: Hep B S AB Quant (Post): <3.1 m[IU]/mL — ABNORMAL LOW (ref >9.9)

## 2021-02-04 NOTE — Progress Notes (Signed)
PROGRESS NOTE    Lauren Hensley  [id number] DOB: Jun 25, 1966 DOA: 02/01/2021 PCP: Pcp, No   Chief Complaint  Patient presents with  . Foot Swelling  Brief Narrative: 55 year old female no known past medical history but has not been seeing any doctors on regular basis presented to the ED with progressive lower extremity edema for few weeks along with shortness of breath with exertion and at rest progressive weakness.  Family has been pushing her to go to see her doctor about seeing has not done so. In the ED found to have severe microcytic anemia with iron deficiency, thrombocytopenia along with bilateral lower extremity edema, occult blood positive in the stool.  She has had no prior colonoscopies.Patient was admitted for further management transfusion.  Patient had 4 units PRBC hemoglobin has been stabilized since. GI is following considering EGD colonoscopy.    Subjective: Seen and examined.  Resting comfortably Overnight no acute events, no fever, Hemoglobin at 8.2 g.  No nausea vomiting.  Assessment & Plan:  Severe microcytic symptomatic anemia Severe iron deficiency anemia-ferritin at 2 Hemoccult positive stool per EDP Status post 4 units PRBC transfusion.  Hemoglobin remained stable.Anemia panel with severely low ferritin at 2, w/ normal [id number] and folic acid.  Patient does complains of heavy menstrual periods-will need OB/GYN eval at somepoint -awaiting on EGD/COLOnoscopy first- once urine drug screen is clear of cocaine per anesthesia-she will benefit with OB/GYN evaluation inpatient after egd/colo as no pcp, no insurance. GI following closely, continue Protonix twice daily. Hepatitis serology negative.  Placed on soft diet by GI, check urine drug screen in a.m. and clear liquid diet in the morning. Recent Labs  Lab 02/02/21 0844 02/02/21 1445 02/02/21 2341 02/03/21 0430 02/04/21 0341  HGB 6.2* 6.2* 8.5* 7.9* 8.2*  HCT 20.4* 21.8* 28.3* 26.1* 26.8*   Generalized weakness   from anemia.  Significantly improved.She has been ambulatory.  Lower extremity swelling for months Dyspnea on exertion: Suspect this is due to her severe anemia.Troponin was negative.BNP somewhat elevated 334, chest x-ray-showed mild vascular congestion. Echo done to evaluate LVEF, to rule out CHF-results pending- [facility] cards to read.  Hypokalemia: Repleted and resolved.   Mildly elevated total bili.Monitor. Urine drug screen positive with cocaine: Admits taking cocaine 2 days prior to admission-drug screen still positive but patient is not tachycardic or hypertensive and she has no chest pain- will likely take few days to clear.  Thrombocytopenia likely in the setting of iron deficiency Versus acute illness. Improved. Recent Labs  Lab 02/02/21 0844 02/02/21 1445 02/02/21 2341 02/03/21 0430 02/04/21 0341  PLT 63* 85* 145* 148* 220   Morbid obesity with BMI 33:Will benefit weight loss PCP follow-up.  Diet Order            DIET SOFT Room service appropriate? Yes; Fluid consistency: Thin  Diet effective now                Patient's Body mass index is 33.97 kg/m. DVT prophylaxis: SCDs Start: 02/01/21 2245 Code Status:   Code Status: Full Code  Family Communication: plan of care discussed with patient at bedside.  Status [id number]  Remains inpatient appropriate because:Ongoing diagnostic testing needed not appropriate for outpatient work up, IV treatments appropriate due to intensity of illness or inability to take PO and Inpatient level of care appropriate due to severity of illness Dispo:The patient is from: Home/homeless            Anticipated d/c is [id number] arrangement. once H&H stable anemia and  GI work-up completed            Patient currently is not medically stable to d/c.            Difficult to place patient No Unresulted Labs (From admission, onward)          Start     Ordered   02/03/21 5573  Basic metabolic panel  Daily,   R     Question:  Specimen  collection method  Answer:  Lab=Lab collect   02/02/21 0815   02/03/21 0500  CBC  Daily,   R     Question:  Specimen collection method  Answer:  Lab=Lab collect   02/02/21 1743        Medications reviewed:  Scheduled Meds: . pantoprazole (PROTONIX) IV  40 mg Intravenous Q12H   Continuous Infusions:  Consultants: GI  Procedures:see note  Antimicrobials: Anti-infectives (From admission, onward)   None     Culture/Microbiology No results found for: SDES, SPECREQUEST, CULT, REPTSTATUS  Other culture-see note  Objective: Vitals: Today's Vitals   02/03/21 2120 02/04/21 0739 02/04/21 1127 02/04/21 1258  BP:  115/70 (!) 120/50   Pulse:  74 65   Resp:  20 18   Temp:  98.6 F (37 C) 98.8 F (37.1 C)   TempSrc:  Oral Oral   SpO2:  96% 100%   Weight:      Height:      PainSc: 7    0-No pain    Intake/Output Summary (Last 24 hours) at 02/04/2021 1313 Last data filed at 02/04/2021 2202 Gross per 24 hour  Intake --  Output 900 ml  Net -900 ml   Filed Weights   02/01/21 1713  Weight: 104.3 kg   Weight change:   Intake/Output from previous day: 05/09 0701 - 05/10 0700 In: 720 [P.O.:720] Out: 900 [Urine:900] Intake/Output this shift: No intake/output data recorded. Filed Weights   02/01/21 1713  Weight: 104.3 kg    Examination: General exam: AAOx x,obese,older than stated age, weak appearing. HEENT:Oral mucosa moist, Ear/Nose WNL grossly, dentition normal. Respiratory system: bilaterally diminished,no use of accessory muscle Cardiovascular system: S1 & S2 +, No JVD,. Gastrointestinal system: Abdomen soft,NT,ND, BS+ Nervous System:Alert, awake, moving extremities and grossly nonfocal Extremities: no edema, distal peripheral pulses palpable.  Skin: No rashes,no icterus. MSK: Normal muscle bulk,tone, power  Data Reviewed: I have personally reviewed following labs and imaging studies CBC: Recent Labs  Lab 02/02/21 0844 02/02/21 1445 02/02/21 2341  02/03/21 0430 02/04/21 0341  WBC 5.7 6.7 7.7 6.3 7.7  HGB 6.2* 6.2* 8.5* 7.9* 8.2*  HCT 20.4* 21.8* 28.3* 26.1* 26.8*  MCV 67.5* 69.2* 71.3* 71.3* 70.5*  PLT 63* 85* 145* 148* 542   Basic Metabolic Panel: Recent Labs  Lab 02/01/21 1756 02/02/21 0502 02/03/21 0430 02/04/21 0341  NA 138 138 139 138  K 3.7 3.4* 3.4* 4.1  CL 108 109 109 108  CO2 22 22 24 24   GLUCOSE 94 89 85 93  BUN 11 9 6 7   CREATININE 0.73 0.49 0.77 0.83  CALCIUM 8.9 8.5* 8.9 8.7*   GFR: Estimated Creatinine Clearance: 101.9 mL/min (by C-G formula based on SCr of 0.83 mg/dL). Liver Function Tests: Recent Labs  Lab 02/01/21 1756 02/02/21 0502  AST 20 15  ALT 13 12  ALKPHOS 39 36*  BILITOT 0.7 2.0*  PROT 7.5 6.6  ALBUMIN 3.9 3.3*   Recent Labs  Lab 02/01/21 1756  LIPASE 38   No results for input(s):  AMMONIA in the last 168 hours. Coagulation Profile: Recent Labs  Lab 02/01/21 2021  INR 1.3*   Cardiac Enzymes: No results for input(s): CKTOTAL, CKMB, CKMBINDEX, TROPONINI in the last 168 hours. BNP (last 3 results) No results for input(s): PROBNP in the last 8760 hours. HbA1C: No results for input(s): HGBA1C in the last 72 hours. CBG: Recent Labs  Lab 02/01/21 1721  GLUCAP 94   Lipid Profile: No results for input(s): CHOL, HDL, LDLCALC, TRIG, CHOLHDL, LDLDIRECT in the last 72 hours. Thyroid Function Tests: No results for input(s): TSH, T4TOTAL, FREET4, T3FREE, THYROIDAB in the last 72 hours. Anemia Panel: Recent Labs    02/01/21 1756 02/01/21 2021  VITAMINB12 421  --   FOLATE  --  11.7  FERRITIN  --  2*  TIBC  --  508*  IRON  --  19*  RETICCTPCT 1.8  --    Sepsis Labs: No results for input(s): PROCALCITON, LATICACIDVEN in the last 168 hours.  Recent Results (from the past 240 hour(s))  Resp Panel by RT-PCR (Flu A&B, Covid) Nasopharyngeal Swab     Status: None   Collection Time: 02/03/21  8:37 AM   Specimen: Nasopharyngeal Swab; Nasopharyngeal(NP) swabs in vial transport  medium  Result Value Ref Range Status   SARS Coronavirus 2 by RT PCR NEGATIVE NEGATIVE Final    Comment: (NOTE) SARS-CoV-2 target nucleic acids are NOT DETECTED.  The SARS-CoV-2 RNA is generally detectable in upper respiratory specimens during the acute phase of infection. The lowest concentration of SARS-CoV-2 viral copies this assay can detect is 138 copies/mL. A negative result does not preclude SARS-Cov-2 infection and should not be used as the sole basis for treatment or other patient management decisions. A negative result may occur with  improper specimen collection/handling, submission of specimen other than nasopharyngeal swab, presence of viral mutation(s) within the areas targeted by this assay, and inadequate number of viral copies(<138 copies/mL). A negative result must be combined with clinical observations, patient history, and epidemiological information. The expected result is Negative.  Fact Sheet for Patients:  EntrepreneurPulse.com.au  Fact Sheet for Healthcare Providers:  IncredibleEmployment.be  This test is no t yet approved or cleared by the [place] FDA and  has been authorized for detection and/or diagnosis of SARS-CoV-2 by FDA under an Emergency Use Authorization (EUA). This EUA will remain  in effect (meaning this test can be used) for the duration of the COVID-19 declaration under Section 564(b)(1) of the Act, 21 U.S.C.section 360bbb-3(b)(1), unless the authorization is terminated  or revoked sooner.       Influenza A by PCR NEGATIVE NEGATIVE Final   Influenza B by PCR NEGATIVE NEGATIVE Final    Comment: (NOTE) The Xpert Xpress SARS-CoV-2/FLU/RSV plus assay is intended as an aid in the diagnosis of influenza from Nasopharyngeal swab specimens and should not be used as a sole basis for treatment. Nasal washings and aspirates are unacceptable for Xpert Xpress SARS-CoV-2/FLU/RSV testing.  Fact Sheet for  Patients: EntrepreneurPulse.com.au  Fact Sheet for Healthcare Providers: IncredibleEmployment.be  This test is not yet approved or cleared by the [place] FDA and has been authorized for detection and/or diagnosis of SARS-CoV-2 by FDA under an Emergency Use Authorization (EUA). This EUA will remain in effect (meaning this test can be used) for the duration of the COVID-19 declaration under Section 564(b)(1) of the Act, 21 U.S.C. section 360bbb-3(b)(1), unless the authorization is terminated or revoked.  Performed at [facility], [address], [place], [place] [address]  Radiology Studies: No results found.   LOS: 3 days   Antonieta Pert, MD [facility]  02/04/2021, 1:13 PM

## 2021-02-04 NOTE — Care Plan (Signed)
Urine cocaine still positive. Can resume diet today and restart liquids tomorrow and recheck urine drug test. Patient is homeless and without transportation so unable to follow-up as an outpatient. Can discuss any potential options with social work.  Raylene Miyamoto MD, MPH [place]

## 2021-02-05 DIAGNOSIS — R531 Weakness: Secondary | ICD-10-CM

## 2021-02-05 DIAGNOSIS — I5031 Acute diastolic (congestive) heart failure: Secondary | ICD-10-CM

## 2021-02-05 DIAGNOSIS — D696 Thrombocytopenia, unspecified: Secondary | ICD-10-CM

## 2021-02-05 LAB — URINE DRUG SCREEN, QUALITATIVE (ARMC ONLY)
Amphetamines, Ur Screen: NOT DETECTED
Barbiturates, Ur Screen: NOT DETECTED
Benzodiazepine, Ur Scrn: NOT DETECTED
Cannabinoid 50 Ng, Ur ~~LOC~~: NOT DETECTED
Cocaine Metabolite,Ur ~~LOC~~: POSITIVE — AB
MDMA (Ecstasy)Ur Screen: NOT DETECTED
Methadone Scn, Ur: NOT DETECTED
Opiate, Ur Screen: NOT DETECTED
Phencyclidine (PCP) Ur S: NOT DETECTED
Tricyclic, Ur Screen: NOT DETECTED

## 2021-02-05 LAB — CBC
HCT: 25 % — ABNORMAL LOW (ref 36.0–46.0)
Hemoglobin: 7.6 g/dL — ABNORMAL LOW (ref 12.0–15.0)
MCH: 22 pg — ABNORMAL LOW (ref 26.0–34.0)
MCHC: 30.4 g/dL (ref 30.0–36.0)
MCV: 72.3 fL — ABNORMAL LOW (ref 80.0–100.0)
Platelets: 273 10*3/uL (ref 150–400)
RBC: 3.46 MIL/uL — ABNORMAL LOW (ref 3.87–5.11)
WBC: 7.5 10*3/uL (ref 4.0–10.5)
nRBC: 0.3 % — ABNORMAL HIGH (ref 0.0–0.2)

## 2021-02-05 LAB — BASIC METABOLIC PANEL
Anion gap: 7 (ref 5–15)
BUN: 10 mg/dL (ref 6–20)
CO2: 24 mmol/L (ref 22–32)
Calcium: 8.7 mg/dL — ABNORMAL LOW (ref 8.9–10.3)
Chloride: 109 mmol/L (ref 98–111)
Creatinine, Ser: 0.73 mg/dL (ref 0.44–1.00)
GFR, Estimated: >60 mL/min (ref >60)
Glucose, Bld: 88 mg/dL (ref 70–99)
Potassium: 3.9 mmol/L (ref 3.5–5.1)
Sodium: 140 mmol/L (ref 135–145)

## 2021-02-05 NOTE — Plan of Care (Signed)

## 2021-02-05 NOTE — Progress Notes (Signed)
GI Inpatient Follow-up Note  Subjective:  Patient seen in follow-up for severe iron-deficiency anemia. Urine drug screen this morning still positive for cocaine. Dr. Haig Prophet discussed with anesthesia and they will not sedate for procedures until she has a negative urine drug screen. No new complaints. She denies any overt gastrointestinal bleeding. No complaints of nausea, vomiting, abdominal pain.   ROS: See HPI   Physical Examination: BP (!) 116/50 (BP Location: Left Arm)   Pulse (!) 54   Temp 97.8 F (36.6 C) (Oral)   Resp 15   Ht 5\' 9"  (1.753 m)   Wt 104.3 kg   SpO2 100%   BMI 33.97 kg/m   Pleasant, non-toxic appearing female in hospital bed. Son present in hospital room.  Gen: NAD, alert and oriented x 4 HEENT: PEERLA, EOMI, Neck: supple, no JVD or thyromegaly Chest: CTA bilaterally, no wheezes, crackles, or other adventitious sounds CV: RRR, no m/g/c/r Abd: soft, NT, ND, +BS in all four quadrants; no HSM, guarding, ridigity, or rebound tenderness Ext: no edema, well perfused with 2+ pulses, Skin: no rash or lesions noted Lymph: no LAD  Data: Lab Results  Component Value Date   WBC 7.5 02/05/2021   HGB 7.6 (L) 02/05/2021   HCT 25.0 (L) 02/05/2021   MCV 72.3 (L) 02/05/2021   PLT 273 02/05/2021   Recent Labs  Lab 02/03/21 0430 02/04/21 0341 02/05/21 0526  HGB 7.9* 8.2* 7.6*   Lab Results  Component Value Date   NA 140 02/05/2021   K 3.9 02/05/2021   CL 109 02/05/2021   CO2 24 02/05/2021   BUN 10 02/05/2021   CREATININE 0.73 02/05/2021   Lab Results  Component Value Date   ALT 12 02/02/2021   AST 15 02/02/2021   ALKPHOS 36 (L) 02/02/2021   BILITOT 2.0 (H) 02/02/2021   Recent Labs  Lab 02/01/21 2021  INR 1.3*   Assessment/Plan:  55 y/o female admitted for severe iron-deficiency anemia  1. Severe iron-deficiency anemia  2. Heme positive stool  - UDS still positive for cocaine - Re-start diet today and restart liquids tomorrow and check a  urine drug screen tomorrow morning - Ideally want to perform EGD and colonoscopy while inpatient since patient is homeless and no PCP - If UDS is negative tomorrow, can be scheduled for EGD and colonoscopy for Friday  Patient agreeable to plan of care.    Please call with questions or concerns.    Octavia Bruckner, PA-C [facility] [phone] (Cell)

## 2021-02-05 NOTE — Progress Notes (Signed)
PROGRESS NOTE    Lauren Hensley  [id number] DOB: 12-16-1965 DOA: 02/01/2021 PCP: Pcp, No   Chief Complaint  Patient presents with  . Foot Swelling  Brief Narrative: 55 year old female no known past medical history but has not been seeing any doctors on regular basis presented to the ED with progressive lower extremity edema for few weeks along with shortness of breath with exertion and at rest progressive weakness.  Family has been pushing her to go to see her doctor about seeing has not done so. In the ED found to have severe microcytic anemia with iron deficiency, thrombocytopenia along with bilateral lower extremity edema, occult blood positive in the stool.  She has had no prior colonoscopies.Patient was admitted for further management transfusion.  Patient had 4 units PRBC hemoglobin has been stabilized since. GI is following considering EGD colonoscopy.    Subjective: Seen and examined.  Resting comfortably Overnight no acute events, no fever, Hemoglobin at 7.6 g. Patient tolerating CLD  Assessment & Plan:  Severe microcytic symptomatic anemia Severe iron deficiency anemia-ferritin at 2 Hemoccult positive stool per EDP Status post 4 units PRBC transfusion.  Hemoglobin remained stable.Anemia panel with severely low ferritin at 2, w/ normal [id number] and folic acid.  Patient does complains of heavy menstrual periods-will need OB/GYN eval at somepoint -awaiting on EGD/COLOnoscopy first- once urine drug screen is clear of cocaine per anesthesia-she will benefit with OB/GYN evaluation inpatient after egd/colo as no pcp, no insurance. GI following closely, continue Protonix twice daily. Hepatitis serology negative.  Placed on soft diet by GI, check urine drug screen in a.m. and clear liquid diet in the morning. UDS + for cocaine again on 5/11 - I've reached out to GI to see if they can d/w anesthesia and proceed with luminal eval while here  Recent Labs  Lab 02/02/21 1445 02/02/21 2341  02/03/21 0430 02/04/21 0341 02/05/21 0526  HGB 6.2* 8.5* 7.9* 8.2* 7.6*  HCT 21.8* 28.3* 26.1* 26.8* 25.0*   Generalized weakness  from anemia.  Significantly improved.She has been ambulatory.  Lower extremity swelling for months Dyspnea on exertion: Suspect this is due to her severe anemia.Troponin was negative.BNP somewhat elevated 334, chest x-ray-showed mild vascular congestion. Echo done to evaluate LVEF, to rule out CHF-results pending- [place] cards to read.  Hypokalemia: Repleted and resolved.   Mildly elevated total bili.Monitor. Urine drug screen positive with cocaine: Admits taking cocaine 2 days prior to admission-drug screen still positive but patient is not tachycardic or hypertensive and she has no chest pain- will likely take few days to clear. UDS on 5/11 still +, recheck in am if anesthesia still requires   Thrombocytopenia likely in the setting of iron deficiency Versus acute illness. Improved. Recent Labs  Lab 02/02/21 1445 02/02/21 2341 02/03/21 0430 02/04/21 0341 02/05/21 0526  PLT 85* 145* 148* 220 273   Morbid obesity with BMI 33:Will benefit weight loss PCP follow-up.  Diet Order            Diet clear liquid Room service appropriate? Yes; Fluid consistency: Thin  Diet effective 0500                Patient's Body mass index is 33.97 kg/m. DVT prophylaxis: SCDs Start: 02/01/21 2245 Code Status:   Code Status: Full Code  Family Communication: plan of care discussed with patient at bedside.  Status [id number]  Remains inpatient appropriate because:Ongoing diagnostic testing needed not appropriate for outpatient work up, IV treatments appropriate due to intensity of illness or  inability to take PO and Inpatient level of care appropriate due to severity of illness Dispo:The patient is from: Home/homeless            Anticipated d/c is OJ:[id number] arrangement. once H&H stable anemia and GI work-up completed            Patient currently is not medically  stable to d/c.            Difficult to place patient No Unresulted Labs (From admission, onward)          Start     Ordered   02/03/21 0500  CBC  Daily,   R     Question:  Specimen collection method  Answer:  Lab=Lab collect   02/02/21 1743        Medications reviewed:  Scheduled Meds: . pantoprazole (PROTONIX) IV  40 mg Intravenous Q12H   Continuous Infusions:  Consultants: GI  Procedures:see note  Antimicrobials: Anti-infectives (From admission, onward)   None     Culture/Microbiology No results found for: SDES, SPECREQUEST, CULT, REPTSTATUS  Other culture-see note  Objective: Vitals: Today's Vitals   02/04/21 2128 02/05/21 0549 02/05/21 0736 02/05/21 0800  BP:  116/61 120/64   Pulse:  75 (!) 58   Resp:  18 16   Temp:  98.4 F (36.9 C) 97.9 F (36.6 C)   TempSrc:   Oral   SpO2:  99% 100%   Weight:      Height:      PainSc: 0-No pain   0-No pain    Intake/Output Summary (Last 24 hours) at 02/05/2021 1118 Last data filed at 02/05/2021 1019 Gross per 24 hour  Intake 780 ml  Output --  Net 780 ml   Filed Weights   02/01/21 1713  Weight: 104.3 kg   Weight change:   Intake/Output from previous day: No intake/output data recorded. Intake/Output this shift: Total I/O In: 780 [P.O.:780] Out: -  Filed Weights   02/01/21 1713  Weight: 104.3 kg    Examination: General exam: AAOx x,obese,older than stated age, weak appearing. HEENT:Oral mucosa moist, Ear/Nose WNL grossly, dentition normal. Respiratory system: bilaterally diminished,no use of accessory muscle Cardiovascular system: S1 & S2 +, No JVD,. Gastrointestinal system: Abdomen soft,NT,ND, BS+ Nervous System:Alert, awake, moving extremities and grossly nonfocal Extremities: no edema, distal peripheral pulses palpable.  Skin: No rashes,no icterus. MSK: Normal muscle bulk,tone, power  Data Reviewed: I have personally reviewed following labs and imaging studies CBC: Recent Labs  Lab  02/02/21 1445 02/02/21 2341 02/03/21 0430 02/04/21 0341 02/05/21 0526  WBC 6.7 7.7 6.3 7.7 7.5  HGB 6.2* 8.5* 7.9* 8.2* 7.6*  HCT 21.8* 28.3* 26.1* 26.8* 25.0*  MCV 69.2* 71.3* 71.3* 70.5* 72.3*  PLT 85* 145* 148* 220 299   Basic Metabolic Panel: Recent Labs  Lab 02/01/21 1756 02/02/21 0502 02/03/21 0430 02/04/21 0341 02/05/21 0526  NA 138 138 139 138 140  K 3.7 3.4* 3.4* 4.1 3.9  CL 108 109 109 108 109  CO2 22 22 24 24 24   GLUCOSE 94 89 85 93 88  BUN 11 9 6 7 10   CREATININE 0.73 0.49 0.77 0.83 0.73  CALCIUM 8.9 8.5* 8.9 8.7* 8.7*   GFR: Estimated Creatinine Clearance: 105.7 mL/min (by C-G formula based on SCr of 0.73 mg/dL). Liver Function Tests: Recent Labs  Lab 02/01/21 1756 02/02/21 0502  AST 20 15  ALT 13 12  ALKPHOS 39 36*  BILITOT 0.7 2.0*  PROT 7.5 6.6  ALBUMIN 3.9 3.3*  Recent Labs  Lab 02/01/21 1756  LIPASE 38   No results for input(s): AMMONIA in the last 168 hours. Coagulation Profile: Recent Labs  Lab 02/01/21 2021  INR 1.3*   Cardiac Enzymes: No results for input(s): CKTOTAL, CKMB, CKMBINDEX, TROPONINI in the last 168 hours. BNP (last 3 results) No results for input(s): PROBNP in the last 8760 hours. HbA1C: No results for input(s): HGBA1C in the last 72 hours. CBG: Recent Labs  Lab 02/01/21 1721  GLUCAP 94   Lipid Profile: No results for input(s): CHOL, HDL, LDLCALC, TRIG, CHOLHDL, LDLDIRECT in the last 72 hours. Thyroid Function Tests: No results for input(s): TSH, T4TOTAL, FREET4, T3FREE, THYROIDAB in the last 72 hours. Anemia Panel: No results for input(s): VITAMINB12, FOLATE, FERRITIN, TIBC, IRON, RETICCTPCT in the last 72 hours. Sepsis Labs: No results for input(s): PROCALCITON, LATICACIDVEN in the last 168 hours.  Recent Results (from the past 240 hour(s))  Resp Panel by RT-PCR (Flu A&B, Covid) Nasopharyngeal Swab     Status: None   Collection Time: 02/03/21  8:37 AM   Specimen: Nasopharyngeal Swab; Nasopharyngeal(NP)  swabs in vial transport medium  Result Value Ref Range Status   SARS Coronavirus 2 by RT PCR NEGATIVE NEGATIVE Final    Comment: (NOTE) SARS-CoV-2 target nucleic acids are NOT DETECTED.  The SARS-CoV-2 RNA is generally detectable in upper respiratory specimens during the acute phase of infection. The lowest concentration of SARS-CoV-2 viral copies this assay can detect is 138 copies/mL. A negative result does not preclude SARS-Cov-2 infection and should not be used as the sole basis for treatment or other patient management decisions. A negative result may occur with  improper specimen collection/handling, submission of specimen other than nasopharyngeal swab, presence of viral mutation(s) within the areas targeted by this assay, and inadequate number of viral copies(<138 copies/mL). A negative result must be combined with clinical observations, patient history, and epidemiological information. The expected result is Negative.  Fact Sheet for Patients:  EntrepreneurPulse.com.au  Fact Sheet for Healthcare Providers:  IncredibleEmployment.be  This test is no t yet approved or cleared by the [place] FDA and  has been authorized for detection and/or diagnosis of SARS-CoV-2 by FDA under an Emergency Use Authorization (EUA). This EUA will remain  in effect (meaning this test can be used) for the duration of the COVID-19 declaration under Section 564(b)(1) of the Act, 21 U.S.C.section 360bbb-3(b)(1), unless the authorization is terminated  or revoked sooner.       Influenza A by PCR NEGATIVE NEGATIVE Final   Influenza B by PCR NEGATIVE NEGATIVE Final    Comment: (NOTE) The Xpert Xpress SARS-CoV-2/FLU/RSV plus assay is intended as an aid in the diagnosis of influenza from Nasopharyngeal swab specimens and should not be used as a sole basis for treatment. Nasal washings and aspirates are unacceptable for Xpert Xpress  SARS-CoV-2/FLU/RSV testing.  Fact Sheet for Patients: EntrepreneurPulse.com.au  Fact Sheet for Healthcare Providers: IncredibleEmployment.be  This test is not yet approved or cleared by the [place] FDA and has been authorized for detection and/or diagnosis of SARS-CoV-2 by FDA under an Emergency Use Authorization (EUA). This EUA will remain in effect (meaning this test can be used) for the duration of the COVID-19 declaration under Section 564(b)(1) of the Act, 21 U.S.C. section 360bbb-3(b)(1), unless the authorization is terminated or revoked.  Performed at [facility], [address] [place], [place] [address]      Radiology Studies: ECHOCARDIOGRAM COMPLETE  Result Date: 02/04/2021    ECHOCARDIOGRAM  REPORT   Patient Name:   Lauren Hensley Date of Exam: 02/03/2021 Medical Rec #:  [id number]    Height:       69.0 in Accession #:    [id number]   Weight:       230.0 lb Date of Birth:  09-19-1966   BSA:          2.192 m Patient Age:    35 years     BP:           149/82 mmHg Patient Gender: F            HR:           63 bpm. Exam Location:  [facility] Procedure: 2D Echo, Color Doppler and Cardiac Doppler Indications:     I50.9 Congestive Heart Failure  History:         Patient has no prior history of Echocardiogram examinations.                  Signs/Symptoms:Edema.  Sonographer:     Charmayne Sheer RDCS (AE) Referring Phys:  [id number] [facility] Diagnosing Phys: Isaias Cowman MD IMPRESSIONS  1. Left ventricular ejection fraction, by estimation, is 45 to 50%. The left ventricle has mildly decreased function. The left ventricle has no regional wall motion abnormalities. Left ventricular diastolic parameters were normal.  2. Right ventricular systolic function is normal. The right ventricular size is normal.  3. The mitral valve is normal in structure. Moderate mitral valve regurgitation. No evidence of mitral stenosis.  4. Tricuspid valve regurgitation is  moderate to severe.  5. The aortic valve is normal in structure. Aortic valve regurgitation is not visualized. No aortic stenosis is present.  6. The inferior vena cava is normal in size with greater than 50% respiratory variability, suggesting right atrial pressure of 3 mmHg. FINDINGS  Left Ventricle: Left ventricular ejection fraction, by estimation, is 45 to 50%. The left ventricle has mildly decreased function. The left ventricle has no regional wall motion abnormalities. The left ventricular internal cavity size was normal in size. There is no left ventricular hypertrophy. Left ventricular diastolic parameters were normal. Right Ventricle: The right ventricular size is normal. No increase in right ventricular wall thickness. Right ventricular systolic function is normal. Left Atrium: Left atrial size was normal in size. Right Atrium: Right atrial size was normal in size. Pericardium: There is no evidence of pericardial effusion. Mitral Valve: The mitral valve is normal in structure. Moderate mitral valve regurgitation. No evidence of mitral valve stenosis. MV peak gradient, 4.4 mmHg. The mean mitral valve gradient is 1.0 mmHg. Tricuspid Valve: The tricuspid valve is normal in structure. Tricuspid valve regurgitation is moderate to severe. No evidence of tricuspid stenosis. Aortic Valve: The aortic valve is normal in structure. Aortic valve regurgitation is not visualized. No aortic stenosis is present. Aortic valve mean gradient measures 3.0 mmHg. Aortic valve peak gradient measures 6.1 mmHg. Aortic valve area, by VTI measures 3.52 cm. Pulmonic Valve: The pulmonic valve was normal in structure. Pulmonic valve regurgitation is not visualized. No evidence of pulmonic stenosis. Aorta: The aortic root is normal in size and structure. Venous: The inferior vena cava is normal in size with greater than 50% respiratory variability, suggesting right atrial pressure of 3 mmHg. IAS/Shunts: No atrial level shunt detected  by color flow Doppler.  LEFT VENTRICLE PLAX 2D LVIDd:         5.40 cm  Diastology LVIDs:         4.00  cm  LV e' medial:    9.25 cm/s LV PW:         1.60 cm  LV E/e' medial:  9.9 LV IVS:        1.00 cm  LV e' lateral:   15.60 cm/s LVOT diam:     2.40 cm  LV E/e' lateral: 5.9 LV SV:         75 LV SV Index:   34 LVOT Area:     4.52 cm  RIGHT VENTRICLE RV Basal diam:  3.90 cm LEFT ATRIUM              Index       RIGHT ATRIUM           Index LA diam:        4.40 cm  2.01 cm/m  RA Area:     26.40 cm LA Vol (A2C):   103.0 ml 46.98 ml/m RA Volume:   88.40 ml  40.32 ml/m LA Vol (A4C):   103.0 ml 46.98 ml/m LA Biplane Vol: 109.0 ml 49.72 ml/m  AORTIC VALVE                   PULMONIC VALVE AV Area (Vmax):    3.07 cm    PV Vmax:       0.76 m/s AV Area (Vmean):   2.97 cm    PV Vmean:      51.100 cm/s AV Area (VTI):     3.52 cm    PV VTI:        0.148 m AV Vmax:           123.00 cm/s PV Peak grad:  2.3 mmHg AV Vmean:          82.300 cm/s PV Mean grad:  1.0 mmHg AV VTI:            0.212 m AV Peak Grad:      6.1 mmHg AV Mean Grad:      3.0 mmHg LVOT Vmax:         83.60 cm/s LVOT Vmean:        54.000 cm/s LVOT VTI:          0.165 m LVOT/AV VTI ratio: 0.78  AORTA Ao Root diam: 3.40 cm MITRAL VALVE               TRICUSPID VALVE MV Area (PHT): 6.43 cm    TR Peak grad:   48.2 mmHg MV Area VTI:   2.64 cm    TR Vmax:        347.00 cm/s MV Peak grad:  4.4 mmHg MV Mean grad:  1.0 mmHg    SHUNTS MV Vmax:       1.05 m/s    Systemic VTI:  0.16 m MV Vmean:      54.6 cm/s   Systemic Diam: 2.40 cm MV Decel Time: 118 msec MV E velocity: 91.70 cm/s MV A velocity: 48.80 cm/s MV E/A ratio:  1.88 Isaias Cowman MD Electronically signed by Isaias Cowman MD Signature Date/Time: 02/04/2021/1:19:40 PM    Final      LOS: 4 days   Max Sane, MD [facility]  02/05/2021, 11:18 AM

## 2021-02-06 DIAGNOSIS — R195 Other fecal abnormalities: Secondary | ICD-10-CM

## 2021-02-06 LAB — CBC
HCT: 25.8 % — ABNORMAL LOW (ref 36.0–46.0)
Hemoglobin: 7.6 g/dL — ABNORMAL LOW (ref 12.0–15.0)
MCH: 21.3 pg — ABNORMAL LOW (ref 26.0–34.0)
MCHC: 29.5 g/dL — ABNORMAL LOW (ref 30.0–36.0)
MCV: 72.3 fL — ABNORMAL LOW (ref 80.0–100.0)
Platelets: 331 10*3/uL (ref 150–400)
RBC: 3.57 MIL/uL — ABNORMAL LOW (ref 3.87–5.11)
WBC: 5.7 10*3/uL (ref 4.0–10.5)
nRBC: 0 % (ref 0.0–0.2)

## 2021-02-06 LAB — BASIC METABOLIC PANEL
Anion gap: 7 (ref 5–15)
BUN: 6 mg/dL (ref 6–20)
CO2: 24 mmol/L (ref 22–32)
Calcium: 8.9 mg/dL (ref 8.9–10.3)
Chloride: 109 mmol/L (ref 98–111)
Creatinine, Ser: 0.74 mg/dL (ref 0.44–1.00)
GFR, Estimated: >60 mL/min (ref >60)
Glucose, Bld: 82 mg/dL (ref 70–99)
Potassium: 3.9 mmol/L (ref 3.5–5.1)
Sodium: 140 mmol/L (ref 135–145)

## 2021-02-06 LAB — URINE DRUG SCREEN, QUALITATIVE (ARMC ONLY)
Amphetamines, Ur Screen: NOT DETECTED
Barbiturates, Ur Screen: NOT DETECTED
Benzodiazepine, Ur Scrn: NOT DETECTED
Cannabinoid 50 Ng, Ur ~~LOC~~: NOT DETECTED
Cocaine Metabolite,Ur ~~LOC~~: POSITIVE — AB
MDMA (Ecstasy)Ur Screen: NOT DETECTED
Methadone Scn, Ur: NOT DETECTED
Opiate, Ur Screen: NOT DETECTED
Phencyclidine (PCP) Ur S: NOT DETECTED
Tricyclic, Ur Screen: NOT DETECTED

## 2021-02-06 NOTE — Progress Notes (Addendum)
PROGRESS NOTE    Lauren Hensley  YNW:[id number] DOB: [birth year] DOA: 02/01/2021 PCP: Pcp, No   Chief Complaint  Patient presents with  . Foot Swelling  Brief Narrative: 55 year old female no known past medical history but has not been seeing any doctors on regular basis presented to the ED with progressive lower extremity edema for few weeks along with shortness of breath with exertion and at rest progressive weakness.  Family has been pushing her to go to see her doctor about seeing has not done so. In the ED found to have severe microcytic anemia with iron deficiency, thrombocytopenia along with bilateral lower extremity edema, occult blood positive in the stool.  She has had no prior colonoscopies.Patient was admitted for further management transfusion.  Patient had 4 units PRBC hemoglobin has been stabilized since. GI is following considering EGD colonoscopy.    Subjective: Remains same.  No new complaints,Hemoglobin at 7.6 g. Patient tolerating CLD. UDS still positive for cocaine  Assessment & Plan:  Severe microcytic symptomatic anemia Severe iron deficiency anemia-ferritin at 2 Hemoccult positive stool per EDP Status post 4 units PRBC transfusion.  Hemoglobin remained stable.Anemia panel with severely low ferritin at 2, w/ normal M57 and folic acid.  Patient does complains of heavy menstrual periods-will need OB/GYN eval at somepoint -awaiting on EGD/COLOnoscopy first- once urine drug screen is clear of cocaine per anesthesia-she will benefit with OB/GYN evaluation inpatient after egd/colo as no pcp, no insurance. GI following closely, continue Protonix twice daily. Hepatitis serology negative.  Placed on soft diet by GI, check urine drug screen in a.m. and clear liquid diet in the morning. UDS + for cocaine again on 5/11 and 5/12-GI waiting for anesthesia clearance for endoscopy.  Per their instruction they would want to see negative urine drug screen for cocaine    Recent Labs  Lab  02/02/21 2341 02/03/21 0430 02/04/21 0341 02/05/21 0526 02/06/21 0525  HGB 8.5* 7.9* 8.2* 7.6* 7.6*  HCT 28.3* 26.1* 26.8* 25.0* 25.8*   Generalized weakness  from anemia.  Significantly improved.She has been ambulatory.  Lower extremity swelling for months Dyspnea on exertion: Suspect this is due to her severe anemia.Troponin was negative.BNP somewhat elevated 334, chest x-ray-showed mild vascular congestion. Echo done to evaluate LVEF, to rule out CHF-results pending- [place] cards to read.  Hypokalemia: Repleted and resolved.   Mildly elevated total bili.Monitor. Urine drug screen positive with cocaine: Admits taking cocaine 2 days prior to admission-drug screen still positive but patient is not tachycardic or hypertensive and she has no chest pain- will likely take few days to clear. UDS on 5/12 still +, recheck in am Thrombocytopenia likely in the setting of iron deficiency Versus acute illness. Improved.  Recent Labs  Lab 02/02/21 2341 02/03/21 0430 02/04/21 0341 02/05/21 0526 02/06/21 0525  PLT 145* 148* 220 273 331   Morbid obesity with BMI 33:Will benefit weight loss PCP follow-up.  Diet Order            Diet clear liquid Room service appropriate? Yes; Fluid consistency: Thin  Diet effective 0500                Patient's Body mass index is 33.97 kg/m. DVT prophylaxis: SCDs Start: 02/01/21 2245 Code Status:   Code Status: Full Code  Family Communication: plan of care discussed with patient at bedside.  Status [id number]  Remains inpatient appropriate because:Ongoing diagnostic testing needed not appropriate for outpatient work up, IV treatments appropriate due to intensity of illness or inability to  take PO and Inpatient level of care appropriate due to severity of illness Dispo:The patient is from: Home/homeless            Anticipated d/c is [id number] arrangement. once H&H stable anemia and GI work-up completed            Patient currently is not medically  stable to d/c.            Difficult to place patient No Unresulted Labs (From admission, onward)          Start     Ordered   02/03/21 0500  CBC  Daily,   R     Question:  Specimen collection method  Answer:  Lab=Lab collect   02/02/21 1743        Medications reviewed:  Scheduled Meds: . pantoprazole (PROTONIX) IV  40 mg Intravenous Q12H   Continuous Infusions:  Consultants: GI  Procedures:see note  Antimicrobials: Anti-infectives (From admission, onward)   None     Culture/Microbiology No results found for: SDES, SPECREQUEST, CULT, REPTSTATUS  Other culture-see note  Objective: Vitals: Today's Vitals   02/05/21 2150 02/06/21 0453 02/06/21 0733 02/06/21 1000  BP:  (!) 150/91 (!) 144/71   Pulse:  83 71   Resp:  17 18   Temp:  98.3 F (36.8 C) 98.2 F (36.8 C)   TempSrc:   Oral   SpO2:  97% 100%   Weight:      Height:      PainSc: 0-No pain   0-No pain    Intake/Output Summary (Last 24 hours) at 02/06/2021 1118 Last data filed at 02/06/2021 1008 Gross per 24 hour  Intake 2100 ml  Output --  Net 2100 ml   Filed Weights   02/01/21 1713  Weight: 104.3 kg   Weight change:   Intake/Output from previous day: 05/11 0701 - 05/12 0700 In: 2160 [P.O.:2160] Out: -  Intake/Output this shift: Total I/O In: 720 [P.O.:720] Out: -  Filed Weights   02/01/21 1713  Weight: 104.3 kg    Examination: General exam: AAOx x,obese,older than stated age, weak appearing. HEENT:Oral mucosa moist, Ear/Nose WNL grossly, dentition normal. Respiratory system: bilaterally diminished,no use of accessory muscle Cardiovascular system: S1 & S2 +, No JVD,. Gastrointestinal system: Abdomen soft,NT,ND, BS+ Nervous System:Alert, awake, moving extremities and grossly nonfocal Extremities: no edema, distal peripheral pulses palpable.  Skin: No rashes,no icterus. MSK: Normal muscle bulk,tone, power  Data Reviewed: I have personally reviewed following labs and imaging  studies CBC: Recent Labs  Lab 02/02/21 2341 02/03/21 0430 02/04/21 0341 02/05/21 0526 02/06/21 0525  WBC 7.7 6.3 7.7 7.5 5.7  HGB 8.5* 7.9* 8.2* 7.6* 7.6*  HCT 28.3* 26.1* 26.8* 25.0* 25.8*  MCV 71.3* 71.3* 70.5* 72.3* 72.3*  PLT 145* 148* 220 273 [id number]   Basic Metabolic Panel: Recent Labs  Lab 02/02/21 0502 02/03/21 0430 02/04/21 0341 02/05/21 0526 02/06/21 0525  NA 138 139 138 140 140  K 3.4* 3.4* 4.1 3.9 3.9  CL 109 109 108 109 109  CO2 22 24 24 24 24   GLUCOSE 89 85 93 88 82  BUN 9 6 7 10 6   CREATININE 0.49 0.77 0.83 0.73 0.74  CALCIUM 8.5* 8.9 8.7* 8.7* 8.9   GFR: Estimated Creatinine Clearance: 105.7 mL/min (by C-G formula based on SCr of 0.74 mg/dL). Liver Function Tests: Recent Labs  Lab 02/01/21 1756 02/02/21 0502  AST 20 15  ALT 13 12  ALKPHOS 39 36*  BILITOT 0.7 2.0*  PROT  7.5 6.6  ALBUMIN 3.9 3.3*   Recent Labs  Lab 02/01/21 1756  LIPASE 38   No results for input(s): AMMONIA in the last 168 hours. Coagulation Profile: Recent Labs  Lab 02/01/21 2021  INR 1.3*   Cardiac Enzymes: No results for input(s): CKTOTAL, CKMB, CKMBINDEX, TROPONINI in the last 168 hours. BNP (last 3 results) No results for input(s): PROBNP in the last 8760 hours. HbA1C: No results for input(s): HGBA1C in the last 72 hours. CBG: Recent Labs  Lab 02/01/21 1721  GLUCAP 94   Lipid Profile: No results for input(s): CHOL, HDL, LDLCALC, TRIG, CHOLHDL, LDLDIRECT in the last 72 hours. Thyroid Function Tests: No results for input(s): TSH, T4TOTAL, FREET4, T3FREE, THYROIDAB in the last 72 hours. Anemia Panel: No results for input(s): VITAMINB12, FOLATE, FERRITIN, TIBC, IRON, RETICCTPCT in the last 72 hours. Sepsis Labs: No results for input(s): PROCALCITON, LATICACIDVEN in the last 168 hours.  Recent Results (from the past 240 hour(s))  Resp Panel by RT-PCR (Flu A&B, Covid) Nasopharyngeal Swab     Status: None   Collection Time: 02/03/21  8:37 AM   Specimen:  Nasopharyngeal Swab; Nasopharyngeal(NP) swabs in vial transport medium  Result Value Ref Range Status   SARS Coronavirus 2 by RT PCR NEGATIVE NEGATIVE Final    Comment: (NOTE) SARS-CoV-2 target nucleic acids are NOT DETECTED.  The SARS-CoV-2 RNA is generally detectable in upper respiratory specimens during the acute phase of infection. The lowest concentration of SARS-CoV-2 viral copies this assay can detect is 138 copies/mL. A negative result does not preclude SARS-Cov-2 infection and should not be used as the sole basis for treatment or other patient management decisions. A negative result may occur with  improper specimen collection/handling, submission of specimen other than nasopharyngeal swab, presence of viral mutation(s) within the areas targeted by this assay, and inadequate number of viral copies(<138 copies/mL). A negative result must be combined with clinical observations, patient history, and epidemiological information. The expected result is Negative.  Fact Sheet for Patients:  EntrepreneurPulse.com.au  Fact Sheet for Healthcare Providers:  IncredibleEmployment.be  This test is no t yet approved or cleared by the [place] FDA and  has been authorized for detection and/or diagnosis of SARS-CoV-2 by FDA under an Emergency Use Authorization (EUA). This EUA will remain  in effect (meaning this test can be used) for the duration of the COVID-19 declaration under Section 564(b)(1) of the Act, 21 U.S.C.section 360bbb-3(b)(1), unless the authorization is terminated  or revoked sooner.       Influenza A by PCR NEGATIVE NEGATIVE Final   Influenza B by PCR NEGATIVE NEGATIVE Final    Comment: (NOTE) The Xpert Xpress SARS-CoV-2/FLU/RSV plus assay is intended as an aid in the diagnosis of influenza from Nasopharyngeal swab specimens and should not be used as a sole basis for treatment. Nasal washings and aspirates are unacceptable for  Xpert Xpress SARS-CoV-2/FLU/RSV testing.  Fact Sheet for Patients: EntrepreneurPulse.com.au  Fact Sheet for Healthcare Providers: IncredibleEmployment.be  This test is not yet approved or cleared by the [place] FDA and has been authorized for detection and/or diagnosis of SARS-CoV-2 by FDA under an Emergency Use Authorization (EUA). This EUA will remain in effect (meaning this test can be used) for the duration of the COVID-19 declaration under Section 564(b)(1) of the Act, 21 U.S.C. section 360bbb-3(b)(1), unless the authorization is terminated or revoked.  Performed at [facility], [address] [place], [place] [address]      Radiology Studies: No results  found.   LOS: 5 days   Max Sane, MD [facility]  02/06/2021, 11:18 AM

## 2021-02-06 NOTE — Care Plan (Signed)
UDS still positive. Can resume previous diet. Repeat UDS in the am and place on clear liquid diet after midnight tonight.  Raylene Miyamoto MD, MPH [facility]

## 2021-02-06 NOTE — Plan of Care (Signed)

## 2021-02-07 DIAGNOSIS — I5021 Acute systolic (congestive) heart failure: Secondary | ICD-10-CM

## 2021-02-07 LAB — CBC
HCT: 27.1 % — ABNORMAL LOW (ref 36.0–46.0)
Hemoglobin: 8.1 g/dL — ABNORMAL LOW (ref 12.0–15.0)
MCH: 21.6 pg — ABNORMAL LOW (ref 26.0–34.0)
MCHC: 29.9 g/dL — ABNORMAL LOW (ref 30.0–36.0)
MCV: 72.3 fL — ABNORMAL LOW (ref 80.0–100.0)
Platelets: 398 10*3/uL (ref 150–400)
RBC: 3.75 MIL/uL — ABNORMAL LOW (ref 3.87–5.11)
WBC: 5.2 10*3/uL (ref 4.0–10.5)
nRBC: 0 % (ref 0.0–0.2)

## 2021-02-07 LAB — URINE DRUG SCREEN, QUALITATIVE (ARMC ONLY)
Amphetamines, Ur Screen: NOT DETECTED
Barbiturates, Ur Screen: NOT DETECTED
Benzodiazepine, Ur Scrn: NOT DETECTED
Cannabinoid 50 Ng, Ur ~~LOC~~: NOT DETECTED
Cocaine Metabolite,Ur ~~LOC~~: NOT DETECTED
MDMA (Ecstasy)Ur Screen: NOT DETECTED
Methadone Scn, Ur: NOT DETECTED
Opiate, Ur Screen: NOT DETECTED
Phencyclidine (PCP) Ur S: NOT DETECTED
Tricyclic, Ur Screen: NOT DETECTED

## 2021-02-07 LAB — BASIC METABOLIC PANEL
Anion gap: 7 (ref 5–15)
BUN: 7 mg/dL (ref 6–20)
CO2: 25 mmol/L (ref 22–32)
Calcium: 9.1 mg/dL (ref 8.9–10.3)
Chloride: 108 mmol/L (ref 98–111)
Creatinine, Ser: 0.72 mg/dL (ref 0.44–1.00)
GFR, Estimated: >60 mL/min (ref >60)
Glucose, Bld: 86 mg/dL (ref 70–99)
Potassium: 4.1 mmol/L (ref 3.5–5.1)
Sodium: 140 mmol/L (ref 135–145)

## 2021-02-07 MED ORDER — PEG 3350-KCL-NA BICARB-NACL 420 G PO SOLR
4000.0000 mL | Freq: Once | ORAL | Status: AC
  Administered 2021-02-07: 4000 mL via ORAL
  Filled 2021-02-07: qty 4000

## 2021-02-07 NOTE — Progress Notes (Signed)
PROGRESS NOTE    Lauren Hensley  LFY:[id number] DOB: [birth year] DOA: 02/01/2021 PCP: Pcp, No   Chief Complaint  Patient presents with  . Foot Swelling  Brief Narrative: 55 year old female no known past medical history but has not been seeing any doctors on regular basis presented to the ED with progressive lower extremity edema for few weeks along with shortness of breath with exertion and at rest progressive weakness.  Family has been pushing her to go to see her doctor about seeing has not done so. In the ED found to have severe microcytic anemia with iron deficiency, thrombocytopenia along with bilateral lower extremity edema, occult blood positive in the stool.  She has had no prior colonoscopies.Patient was admitted for further management transfusion.  Patient had 4 units PRBC hemoglobin has been stabilized since. GI is following considering EGD colonoscopy.    Subjective: Patient sleepy, agreeable for procedure tomorrow.  Denies any new complaints  Assessment & Plan:  Severe microcytic symptomatic anemia Severe iron deficiency anemia-ferritin at 2 Hemoccult positive stool per EDP Status post 4 units PRBC transfusion.  Hemoglobin remained stable.Anemia panel with severely low ferritin at 2, w/ normal E52 and folic acid.  Patient does complains of heavy menstrual periods-will need OB/GYN eval at somepoint -awaiting on EGD/COLOnoscopy tomorrow -she will benefit with OB/GYN evaluation inpatient after egd/colo as no pcp, no insurance. continue Protonix twice daily. Hepatitis serology negative.  UDS negative on 5/13  Recent Labs  Lab 02/03/21 0430 02/04/21 0341 02/05/21 0526 02/06/21 0525 02/07/21 0539  HGB 7.9* 8.2* 7.6* 7.6* 8.1*  HCT 26.1* 26.8* 25.0* 25.8* 27.1*   Generalized weakness  from anemia.  Significantly improved.She has been ambulatory.  Lower extremity swelling for months/Dyspnea on exertion:  due to her severe anemia.Troponin was negative.Echo shows EF of 45 to  50%.  Hypokalemia: Repleted and resolved.    Mildly elevated total bili.Monitor.  Urine drug screen positive with cocaine: Admits taking cocaine 2 days prior to admission-her UDS on 5/13 is negative  Thrombocytopenia likely in the setting of iron deficiency Versus acute illness. Improved.  Recent Labs  Lab 02/03/21 0430 02/04/21 0341 02/05/21 0526 02/06/21 0525 02/07/21 0539  PLT 148* 220 273 331 398   Morbid obesity with BMI 33:Will benefit weight loss PCP follow-up.  Diet Order            Diet clear liquid Room service appropriate? Yes; Fluid consistency: Thin  Diet effective 0500 tomorrow                Patient's Body mass index is 33.97 kg/m. DVT prophylaxis: SCDs Start: 02/01/21 2245 Code Status:   Code Status: Full Code  Family Communication: plan of care discussed with patient at bedside.  Status [id number]  Remains inpatient appropriate because:Ongoing diagnostic testing needed not appropriate for outpatient work up, IV treatments appropriate due to intensity of illness or inability to take PO and Inpatient level of care appropriate due to severity of illness Dispo:The patient is from: Home/homeless            Anticipated d/c is [id number] arrangement. once H&H stable anemia and GI work-up completed            Patient currently is not medically stable to d/c.            Difficult to place patient No Unresulted Labs (From admission, onward)         None    Medications reviewed:  Scheduled Meds: . pantoprazole (PROTONIX) IV  40 mg  Intravenous Q12H   Continuous Infusions:  Consultants: GI  Procedures:see note  Antimicrobials: Anti-infectives (From admission, onward)   None     Culture/Microbiology No results found for: SDES, SPECREQUEST, CULT, REPTSTATUS  Other culture-see note  Objective: Vitals: Today's Vitals   02/06/21 2021 02/07/21 0000 02/07/21 0431 02/07/21 0826  BP: 129/62 116/70 139/64 127/72  Pulse: 72 (!) 59 (!) 51 80  Resp:  18 20 16 18   Temp: 97.8 F (36.6 C) 98.2 F (36.8 C) 98.5 F (36.9 C) 98.5 F (36.9 C)  TempSrc: Oral Oral Oral   SpO2: 100% 100% 99% 100%  Weight:      Height:      PainSc: 0-No pain Asleep  Asleep    Intake/Output Summary (Last 24 hours) at 02/07/2021 1415 Last data filed at 02/07/2021 1402 Gross per 24 hour  Intake 1560 ml  Output --  Net 1560 ml   Filed Weights   02/01/21 1713  Weight: 104.3 kg   Weight change:   Intake/Output from previous day: 05/12 0701 - 05/13 0700 In: 1440 [P.O.:1440] Out: -  Intake/Output this shift: Total I/O In: 1560 [P.O.:1560] Out: -  Filed Weights   02/01/21 1713  Weight: 104.3 kg    Examination: General exam: AAOx x,obese,older than stated age, weak appearing. HEENT:Oral mucosa moist, Ear/Nose WNL grossly, dentition normal. Respiratory system: bilaterally diminished,no use of accessory muscle Cardiovascular system: S1 & S2 +, No JVD,. Gastrointestinal system: Abdomen soft,NT,ND, BS+ Nervous System: Sleepy today Extremities: no edema, distal peripheral pulses palpable.  Skin: No rashes,no icterus. MSK: Normal muscle bulk,tone, power  Data Reviewed: I have personally reviewed following labs and imaging studies CBC: Recent Labs  Lab 02/03/21 0430 02/04/21 0341 02/05/21 0526 02/06/21 0525 02/07/21 0539  WBC 6.3 7.7 7.5 5.7 5.2  HGB 7.9* 8.2* 7.6* 7.6* 8.1*  HCT 26.1* 26.8* 25.0* 25.8* 27.1*  MCV 71.3* 70.5* 72.3* 72.3* 72.3*  PLT 148* 220 273 331 831   Basic Metabolic Panel: Recent Labs  Lab 02/03/21 0430 02/04/21 0341 02/05/21 0526 02/06/21 0525 02/07/21 0539  NA 139 138 140 140 140  K 3.4* 4.1 3.9 3.9 4.1  CL 109 108 109 109 108  CO2 24 24 24 24 25   GLUCOSE 85 93 88 82 86  BUN 6 7 10 6 7   CREATININE 0.77 0.83 0.73 0.74 0.72  CALCIUM 8.9 8.7* 8.7* 8.9 9.1   GFR: Estimated Creatinine Clearance: 105.7 mL/min (by C-G formula based on SCr of 0.72 mg/dL). Liver Function Tests: Recent Labs  Lab 02/01/21 1756  02/02/21 0502  AST 20 15  ALT 13 12  ALKPHOS 39 36*  BILITOT 0.7 2.0*  PROT 7.5 6.6  ALBUMIN 3.9 3.3*   Recent Labs  Lab 02/01/21 1756  LIPASE 38   No results for input(s): AMMONIA in the last 168 hours. Coagulation Profile: Recent Labs  Lab 02/01/21 2021  INR 1.3*   Cardiac Enzymes: No results for input(s): CKTOTAL, CKMB, CKMBINDEX, TROPONINI in the last 168 hours. BNP (last 3 results) No results for input(s): PROBNP in the last 8760 hours. HbA1C: No results for input(s): HGBA1C in the last 72 hours. CBG: Recent Labs  Lab 02/01/21 1721  GLUCAP 94   Lipid Profile: No results for input(s): CHOL, HDL, LDLCALC, TRIG, CHOLHDL, LDLDIRECT in the last 72 hours. Thyroid Function Tests: No results for input(s): TSH, T4TOTAL, FREET4, T3FREE, THYROIDAB in the last 72 hours. Anemia Panel: No results for input(s): VITAMINB12, FOLATE, FERRITIN, TIBC, IRON, RETICCTPCT in the last 72  hours. Sepsis Labs: No results for input(s): PROCALCITON, LATICACIDVEN in the last 168 hours.  Recent Results (from the past 240 hour(s))  Resp Panel by RT-PCR (Flu A&B, Covid) Nasopharyngeal Swab     Status: None   Collection Time: 02/03/21  8:37 AM   Specimen: Nasopharyngeal Swab; Nasopharyngeal(NP) swabs in vial transport medium  Result Value Ref Range Status   SARS Coronavirus 2 by RT PCR NEGATIVE NEGATIVE Final    Comment: (NOTE) SARS-CoV-2 target nucleic acids are NOT DETECTED.  The SARS-CoV-2 RNA is generally detectable in upper respiratory specimens during the acute phase of infection. The lowest concentration of SARS-CoV-2 viral copies this assay can detect is 138 copies/mL. A negative result does not preclude SARS-Cov-2 infection and should not be used as the sole basis for treatment or other patient management decisions. A negative result may occur with  improper specimen collection/handling, submission of specimen other than nasopharyngeal swab, presence of viral mutation(s) within  the areas targeted by this assay, and inadequate number of viral copies(<138 copies/mL). A negative result must be combined with clinical observations, patient history, and epidemiological information. The expected result is Negative.  Fact Sheet for Patients:  EntrepreneurPulse.com.au  Fact Sheet for Healthcare Providers:  IncredibleEmployment.be  This test is no t yet approved or cleared by the [place] FDA and  has been authorized for detection and/or diagnosis of SARS-CoV-2 by FDA under an Emergency Use Authorization (EUA). This EUA will remain  in effect (meaning this test can be used) for the duration of the COVID-19 declaration under Section 564(b)(1) of the Act, 21 U.S.C.section 360bbb-3(b)(1), unless the authorization is terminated  or revoked sooner.       Influenza A by PCR NEGATIVE NEGATIVE Final   Influenza B by PCR NEGATIVE NEGATIVE Final    Comment: (NOTE) The Xpert Xpress SARS-CoV-2/FLU/RSV plus assay is intended as an aid in the diagnosis of influenza from Nasopharyngeal swab specimens and should not be used as a sole basis for treatment. Nasal washings and aspirates are unacceptable for Xpert Xpress SARS-CoV-2/FLU/RSV testing.  Fact Sheet for Patients: EntrepreneurPulse.com.au  Fact Sheet for Healthcare Providers: IncredibleEmployment.be  This test is not yet approved or cleared by the [place] FDA and has been authorized for detection and/or diagnosis of SARS-CoV-2 by FDA under an Emergency Use Authorization (EUA). This EUA will remain in effect (meaning this test can be used) for the duration of the COVID-19 declaration under Section 564(b)(1) of the Act, 21 U.S.C. section 360bbb-3(b)(1), unless the authorization is terminated or revoked.  Performed at [facility], [address], [place], [place] [address]      Radiology Studies: No results found.   LOS:  6 days   Max Sane, MD [facility]  02/07/2021, 2:15 PM

## 2021-02-07 NOTE — Plan of Care (Signed)
  Problem: Bowel/Gastric: Goal: Will show no signs and symptoms of gastrointestinal bleeding Outcome: Progressing   Problem: Clinical Measurements: Goal: Complications related to the disease process, condition or treatment will be avoided or minimized Outcome: Progressing   Problem: Education: Goal: Knowledge of General Education information will improve Description: Including pain rating scale, medication(s)/side effects and non-pharmacologic comfort measures Outcome: Progressing   Problem: Clinical Measurements: Goal: Ability to maintain clinical measurements within normal limits will improve Outcome: Progressing Goal: Will remain free from infection Outcome: Progressing Goal: Diagnostic test results will improve Outcome: Progressing Goal: Respiratory complications will improve Outcome: Progressing Goal: Cardiovascular complication will be avoided Outcome: Progressing   Problem: Coping: Goal: Level of anxiety will decrease Outcome: Progressing   Problem: Safety: Goal: Ability to remain free from injury will improve Outcome: Progressing

## 2021-02-07 NOTE — Care Plan (Signed)
UDS is now negative. Planning for EGD/Colonoscopy tomorrow for IDA. Continue clear liquids today, NPO at midnight except prep. Prep ordered. Further recs after procedures. Dr. Marius Ditch will be covering over the weekend.  Raylene Miyamoto MD, MPH

## 2021-02-08 ENCOUNTER — Inpatient Hospital Stay: Payer: Self-pay | Admitting: Certified Registered"

## 2021-02-08 ENCOUNTER — Encounter
Admission: EM | Disposition: A | Discharge status: Home / Self Care | Payer: Self-pay | Source: Home / Self Care | Attending: Internal Medicine

## 2021-02-08 DIAGNOSIS — D509 Iron deficiency anemia, unspecified: Principal | ICD-10-CM

## 2021-02-08 HISTORY — PX: COLONOSCOPY WITH PROPOFOL: SHX5780

## 2021-02-08 HISTORY — PX: ESOPHAGOGASTRODUODENOSCOPY: SHX5428

## 2021-02-08 SURGERY — EGD (ESOPHAGOGASTRODUODENOSCOPY)
Anesthesia: General

## 2021-02-08 MED ORDER — LIDOCAINE HCL (PF) 2 % IJ SOLN
INTRAMUSCULAR | Status: AC
  Filled 2021-02-08: qty 4

## 2021-02-08 MED ORDER — SODIUM CHLORIDE 0.9 % IV SOLN
510.0000 mg | Freq: Once | INTRAVENOUS | Status: AC
  Administered 2021-02-08: 510 mg via INTRAVENOUS
  Filled 2021-02-08: qty 17

## 2021-02-08 MED ORDER — PROPOFOL 500 MG/50ML IV EMUL
INTRAVENOUS | Status: AC
  Filled 2021-02-08: qty 50

## 2021-02-08 MED ORDER — FENTANYL CITRATE (PF) 100 MCG/2ML IJ SOLN
INTRAMUSCULAR | Status: AC
  Filled 2021-02-08: qty 2

## 2021-02-08 MED ORDER — PROPOFOL 10 MG/ML IV BOLUS
INTRAVENOUS | Status: AC
  Filled 2021-02-08: qty 20

## 2021-02-08 MED ORDER — FENTANYL CITRATE (PF) 100 MCG/2ML IJ SOLN
INTRAMUSCULAR | Status: DC | PRN
  Administered 2021-02-08: 50 ug via INTRAVENOUS

## 2021-02-08 MED ORDER — LIDOCAINE HCL (CARDIAC) PF 100 MG/5ML IV SOSY
PREFILLED_SYRINGE | INTRAVENOUS | Status: DC | PRN
  Administered 2021-02-08: 50 mg via INTRAVENOUS

## 2021-02-08 MED ORDER — PROPOFOL 10 MG/ML IV BOLUS
INTRAVENOUS | Status: DC | PRN
  Administered 2021-02-08: 50 mg via INTRAVENOUS

## 2021-02-08 MED ORDER — GLYCOPYRROLATE 0.2 MG/ML IJ SOLN
INTRAMUSCULAR | Status: AC
  Filled 2021-02-08: qty 1

## 2021-02-08 MED ORDER — PROPOFOL 500 MG/50ML IV EMUL
INTRAVENOUS | Status: DC | PRN
  Administered 2021-02-08: 100 ug/kg/min via INTRAVENOUS

## 2021-02-08 MED ORDER — GLYCOPYRROLATE 0.2 MG/ML IJ SOLN
INTRAMUSCULAR | Status: DC | PRN
  Administered 2021-02-08: .1 mg via INTRAVENOUS

## 2021-02-08 MED ORDER — SODIUM CHLORIDE 0.9 % IV SOLN: INTRAVENOUS | Status: DC

## 2021-02-08 NOTE — OR Nursing (Signed)
RN SPOKE WITH AMANDA RN RE: NPO AFTER MIDNIGHT FOR VIDEO CAPSULE. DR Marius Ditch NOTIFIED

## 2021-02-08 NOTE — Progress Notes (Signed)
PROGRESS NOTE    Lauren Hensley  VHQ:[id number] DOB: Jan 08, 1966 DOA: 02/01/2021 PCP: Pcp, No   Chief Complaint  Patient presents with  . Foot Swelling  Brief Narrative: 55 year old female no known past medical history but has not been seeing any doctors on regular basis presented to the ED with progressive lower extremity edema for few weeks along with shortness of breath with exertion and at rest progressive weakness.  Family has been pushing her to go to see her doctor about seeing has not done so. In the ED found to have severe microcytic anemia with iron deficiency, thrombocytopenia along with bilateral lower extremity edema, occult blood positive in the stool.  She has had no prior colonoscopies.Patient was admitted for further management transfusion.  Patient had 4 units PRBC hemoglobin has been stabilized since. GI is following considering EGD colonoscopy.    Subjective: Patient is awake.  Denies any symptoms.  S/p EGD and colonoscopy which were within normal limits  Assessment & Plan:  Severe microcytic symptomatic anemia Severe iron deficiency anemia-ferritin at 2 Hemoccult positive stool per EDP Status post 4 units PRBC transfusion.  Hemoglobin remained stable.Anemia panel with severely low ferritin at 2, w/ normal U13 and folic acid.  Patient does complains of heavy menstrual periods-will need OB/GYN eval at somepoint -normal EGD/colonoscopy today on 5/14  -capsule endoscopy planned for tomorrow -she will benefit with OB/GYN evaluation inpatient after egd/colo as no pcp, no insurance. continue Protonix twice daily. Hepatitis serology negative.   Recent Labs  Lab 02/03/21 0430 02/04/21 0341 02/05/21 0526 02/06/21 0525 02/07/21 0539  HGB 7.9* 8.2* 7.6* 7.6* 8.1*  HCT 26.1* 26.8* 25.0* 25.8* 27.1*   Generalized weakness  from anemia.  Significantly improved.She has been ambulatory.  Lower extremity swelling for months/Dyspnea on exertion:  due to her severe  anemia.Troponin was negative.Echo shows EF of 45 to 50%.  Hypokalemia: Repleted and resolved.    Mildly elevated total bili.Monitor.  Urine drug screen positive with cocaine: Counseled  Thrombocytopenia likely in the setting of iron deficiency Versus acute illness. Improved.  Recent Labs  Lab 02/03/21 0430 02/04/21 0341 02/05/21 0526 02/06/21 0525 02/07/21 0539  PLT 148* 220 273 331 398   Morbid obesity with BMI 33:Will benefit weight loss PCP follow-up.  Diet Order            Diet NPO time specified Except for: Ice Chips, Sips with Meds  Diet effective midnight           Diet full liquid Room service appropriate? Yes; Fluid consistency: Thin  Diet effective now                Patient's Body mass index is 33.97 kg/m. DVT prophylaxis: SCDs Start: 02/01/21 2245 Code Status:   Code Status: Full Code  Family Communication: plan of care discussed with patient at bedside.  Status KG:[id number]  Remains inpatient appropriate because:Ongoing diagnostic testing needed not appropriate for outpatient work up, IV treatments appropriate due to intensity of illness or inability to take PO and Inpatient level of care appropriate due to severity of illness Dispo:The patient is from: Home/homeless            Anticipated d/c is GU:[id number] arrangement. once H&H stable anemia and GI work-up completed            Patient currently is not medically stable to d/c.            Difficult to place patient No   Medications reviewed:  Scheduled Meds:  Continuous Infusions: . ferumoxytol      Consultants: GI  Procedures:see note  Antimicrobials: Anti-infectives (From admission, onward)   None     Culture/Microbiology No results found for: SDES, SPECREQUEST, CULT, REPTSTATUS  Other culture-see note  Objective: Vitals: Today's Vitals   02/08/21 0858 02/08/21 0900 02/08/21 0915 02/08/21 1030  BP: 135/86 111/77 122/74 (!) 142/88  Pulse: (!) 101 99 98 (!) 101  Resp: 16 20 18 17    Temp: (!) 97.4 F (36.3 C)  97.9 F (36.6 C) 98 F (36.7 C)  TempSrc:      SpO2: 100% 100% 100% 100%  Weight:      Height:      PainSc: Asleep 0-No pain 0-No pain     Intake/Output Summary (Last 24 hours) at 02/08/2021 1357 Last data filed at 02/08/2021 1349 Gross per 24 hour  Intake 2640 ml  Output --  Net 2640 ml   Filed Weights   02/01/21 1713  Weight: 104.3 kg   Weight change:   Intake/Output from previous day: 05/13 0701 - 05/14 0700 In: 2340 [P.O.:2340] Out: -  Intake/Output this shift: Total I/O In: 1080 [P.O.:1080] Out: -  Filed Weights   02/01/21 1713  Weight: 104.3 kg    Examination: General exam: AAOx x,obese,older than stated age, weak appearing. HEENT:Oral mucosa moist, Ear/Nose WNL grossly, dentition normal. Respiratory system: bilaterally diminished,no use of accessory muscle Cardiovascular system: S1 & S2 +, No JVD,. Gastrointestinal system: Abdomen soft,NT,ND, BS+ Nervous System: Awake and alert Extremities: no edema, distal peripheral pulses palpable.  Skin: No rashes,no icterus. MSK: Normal muscle bulk,tone, power  Data Reviewed: I have personally reviewed following labs and imaging studies CBC: Recent Labs  Lab 02/03/21 0430 02/04/21 0341 02/05/21 0526 02/06/21 0525 02/07/21 0539  WBC 6.3 7.7 7.5 5.7 5.2  HGB 7.9* 8.2* 7.6* 7.6* 8.1*  HCT 26.1* 26.8* 25.0* 25.8* 27.1*  MCV 71.3* 70.5* 72.3* 72.3* 72.3*  PLT 148* 220 273 331 458   Basic Metabolic Panel: Recent Labs  Lab 02/03/21 0430 02/04/21 0341 02/05/21 0526 02/06/21 0525 02/07/21 0539  NA 139 138 140 140 140  K 3.4* 4.1 3.9 3.9 4.1  CL 109 108 109 109 108  CO2 24 24 24 24 25   GLUCOSE 85 93 88 82 86  BUN 6 7 10 6 7   CREATININE 0.77 0.83 0.73 0.74 0.72  CALCIUM 8.9 8.7* 8.7* 8.9 9.1   GFR: Estimated Creatinine Clearance: 105.7 mL/min (by C-G formula based on SCr of 0.72 mg/dL). Liver Function Tests: Recent Labs  Lab 02/01/21 1756 02/02/21 0502  AST 20 15  ALT  13 12  ALKPHOS 39 36*  BILITOT 0.7 2.0*  PROT 7.5 6.6  ALBUMIN 3.9 3.3*   Recent Labs  Lab 02/01/21 1756  LIPASE 38   No results for input(s): AMMONIA in the last 168 hours. Coagulation Profile: Recent Labs  Lab 02/01/21 2021  INR 1.3*   Cardiac Enzymes: No results for input(s): CKTOTAL, CKMB, CKMBINDEX, TROPONINI in the last 168 hours. BNP (last 3 results) No results for input(s): PROBNP in the last 8760 hours. HbA1C: No results for input(s): HGBA1C in the last 72 hours. CBG: Recent Labs  Lab 02/01/21 1721  GLUCAP 94   Lipid Profile: No results for input(s): CHOL, HDL, LDLCALC, TRIG, CHOLHDL, LDLDIRECT in the last 72 hours. Thyroid Function Tests: No results for input(s): TSH, T4TOTAL, FREET4, T3FREE, THYROIDAB in the last 72 hours. Anemia Panel: No results for input(s): VITAMINB12, FOLATE, FERRITIN, TIBC, IRON, RETICCTPCT in  the last 72 hours. Sepsis Labs: No results for input(s): PROCALCITON, LATICACIDVEN in the last 168 hours.  Recent Results (from the past 240 hour(s))  Resp Panel by RT-PCR (Flu A&B, Covid) Nasopharyngeal Swab     Status: None   Collection Time: 02/03/21  8:37 AM   Specimen: Nasopharyngeal Swab; Nasopharyngeal(NP) swabs in vial transport medium  Result Value Ref Range Status   SARS Coronavirus 2 by RT PCR NEGATIVE NEGATIVE Final    Comment: (NOTE) SARS-CoV-2 target nucleic acids are NOT DETECTED.  The SARS-CoV-2 RNA is generally detectable in upper respiratory specimens during the acute phase of infection. The lowest concentration of SARS-CoV-2 viral copies this assay can detect is 138 copies/mL. A negative result does not preclude SARS-Cov-2 infection and should not be used as the sole basis for treatment or other patient management decisions. A negative result may occur with  improper specimen collection/handling, submission of specimen other than nasopharyngeal swab, presence of viral mutation(s) within the areas targeted by this assay,  and inadequate number of viral copies(<138 copies/mL). A negative result must be combined with clinical observations, patient history, and epidemiological information. The expected result is Negative.  Fact Sheet for Patients:  EntrepreneurPulse.com.au  Fact Sheet for Healthcare Providers:  IncredibleEmployment.be  This test is no t yet approved or cleared by the [place] FDA and  has been authorized for detection and/or diagnosis of SARS-CoV-2 by FDA under an Emergency Use Authorization (EUA). This EUA will remain  in effect (meaning this test can be used) for the duration of the COVID-19 declaration under Section 564(b)(1) of the Act, 21 U.S.C.section 360bbb-3(b)(1), unless the authorization is terminated  or revoked sooner.       Influenza A by PCR NEGATIVE NEGATIVE Final   Influenza B by PCR NEGATIVE NEGATIVE Final    Comment: (NOTE) The Xpert Xpress SARS-CoV-2/FLU/RSV plus assay is intended as an aid in the diagnosis of influenza from Nasopharyngeal swab specimens and should not be used as a sole basis for treatment. Nasal washings and aspirates are unacceptable for Xpert Xpress SARS-CoV-2/FLU/RSV testing.  Fact Sheet for Patients: EntrepreneurPulse.com.au  Fact Sheet for Healthcare Providers: IncredibleEmployment.be  This test is not yet approved or cleared by the [place] FDA and has been authorized for detection and/or diagnosis of SARS-CoV-2 by FDA under an Emergency Use Authorization (EUA). This EUA will remain in effect (meaning this test can be used) for the duration of the COVID-19 declaration under Section 564(b)(1) of the Act, 21 U.S.C. section 360bbb-3(b)(1), unless the authorization is terminated or revoked.  Performed at [facility], [address], [place], [place] [address]      Radiology Studies: No results found.   LOS: 7 days   Max Sane, MD [facility]  02/08/2021, 1:57 PM

## 2021-02-08 NOTE — Plan of Care (Signed)
  Problem: Clinical Measurements: Goal: Complications related to the disease process, condition or treatment will be avoided or minimized Outcome: Progressing   Problem: Nutrition: Goal: Adequate nutrition will be maintained Outcome: Progressing   Problem: Coping: Goal: Level of anxiety will decrease Outcome: Progressing   Problem: Safety: Goal: Ability to remain free from injury will improve Outcome: Progressing

## 2021-02-08 NOTE — Anesthesia Preprocedure Evaluation (Signed)
Anesthesia Evaluation  Patient identified by MRN, date of birth, ID band Patient awake    Reviewed: Allergy & Precautions, H&P , NPO status , Patient's Chart, lab work & pertinent test results, reviewed documented beta blocker date and time   Airway Mallampati: III   Neck ROM: full    Dental  (+) Poor Dentition, Teeth Intact   Pulmonary neg pulmonary ROS,    Pulmonary exam normal        Cardiovascular Exercise Tolerance: Poor +CHF  Normal cardiovascular exam Rhythm:regular Rate:Normal     Neuro/Psych negative neurological ROS  negative psych ROS   GI/Hepatic negative GI ROS, Neg liver ROS,   Endo/Other  negative endocrine ROS  Renal/GU negative Renal ROS  negative genitourinary   Musculoskeletal   Abdominal   Peds  Hematology  (+) Blood dyscrasia, anemia ,   Anesthesia Other Findings History reviewed. No pertinent past medical history. History reviewed. No pertinent surgical history. BMI    Body Mass Index: 33.97 kg/m     Reproductive/Obstetrics negative OB ROS                             Anesthesia Physical Anesthesia Plan  ASA: III and emergent  Anesthesia Plan: General   Post-op Pain Management:    Induction:   PONV Risk Score and Plan:   Airway Management Planned:   Additional Equipment:   Intra-op Plan:   Post-operative Plan:   Informed Consent: I have reviewed the patients History and Physical, chart, labs and discussed the procedure including the risks, benefits and alternatives for the proposed anesthesia with the patient or authorized representative who has indicated his/her understanding and acceptance.     Dental Advisory Given  Plan Discussed with: CRNA  Anesthesia Plan Comments:         Anesthesia Quick Evaluation

## 2021-02-08 NOTE — Anesthesia Postprocedure Evaluation (Signed)
Anesthesia Post Note  Patient: Lauren Hensley  Procedure(s) Performed: ESOPHAGOGASTRODUODENOSCOPY (EGD) (N/A ) COLONOSCOPY WITH PROPOFOL (N/A )  Patient location during evaluation: PACU Anesthesia Type: General Level of consciousness: awake and alert Pain management: pain level controlled Vital Signs Assessment: post-procedure vital signs reviewed and stable Respiratory status: spontaneous breathing, nonlabored ventilation, respiratory function stable and patient connected to nasal cannula oxygen Cardiovascular status: blood pressure returned to baseline and stable Postop Assessment: no apparent nausea or vomiting Anesthetic complications: no   No complications documented.   Last Vitals:  Vitals:   02/08/21 0900 02/08/21 0915  BP: 111/77 122/74  Pulse: 99 98  Resp: 20 18  Temp:  36.6 C  SpO2: 100% 100%    Last Pain:  Vitals:   02/08/21 0915  TempSrc:   PainSc: 0-No pain                 Molli Barrows

## 2021-02-08 NOTE — Op Note (Signed)
[facility] Patient Name: Lauren Hensley Procedure Date: 02/08/2021 8:16 AM MRN: [id number] Account #: [id number] Date of Birth: Mar 15, 1966 Admit Type: Inpatient Age: 55 Room: [facility] ENDO ROOM 4 Gender: Female Note Status: Finalized Procedure:             Upper GI endoscopy Indications:           Unexplained iron deficiency anemia Providers:             Lin Landsman MD, MD Referring MD:          No Local Md, MD (Referring MD) Medicines:             General Anesthesia Complications:         No immediate complications. Estimated blood loss: None. Procedure:             Pre-Anesthesia Assessment:                        - Prior to the procedure, a History and Physical was                         performed, and patient medications and allergies were                         reviewed. The patient is competent. The risks and                         benefits of the procedure and the sedation options and                         risks were discussed with the patient. All questions                         were answered and informed consent was obtained.                         Patient identification and proposed procedure were                         verified by the physician, the nurse, the                         anesthesiologist, the anesthetist and the technician                         in the pre-procedure area in the procedure room in the                         endoscopy suite. Mental Status Examination: alert and                         oriented. Airway Examination: normal oropharyngeal                         airway and neck mobility. Respiratory Examination:                         clear to auscultation. CV Examination: normal.  Prophylactic Antibiotics: The patient does not require                         prophylactic antibiotics. Prior Anticoagulants: The                         patient has taken no previous anticoagulant  or                         antiplatelet agents. ASA Grade Assessment: III - A                         patient with severe systemic disease. After reviewing                         the risks and benefits, the patient was deemed in                         satisfactory condition to undergo the procedure. The                         anesthesia plan was to use general anesthesia.                         Immediately prior to administration of medications,                         the patient was re-assessed for adequacy to receive                         sedatives. The heart rate, respiratory rate, oxygen                         saturations, blood pressure, adequacy of pulmonary                         ventilation, and response to care were monitored                         throughout the procedure. The physical status of the                         patient was re-assessed after the procedure.                        After obtaining informed consent, the endoscope was                         passed under direct vision. Throughout the procedure,                         the patient's blood pressure, pulse, and oxygen                         saturations were monitored continuously. The Endoscope                         was introduced through the mouth, and advanced to the  second part of duodenum. The upper GI endoscopy was                         accomplished without difficulty. The patient tolerated                         the procedure well. Findings:      The duodenal bulb and second portion of the duodenum were normal.      The entire examined stomach was normal. Biopsies were taken with a cold       forceps for Helicobacter pylori testing.      The cardia and gastric fundus were normal on retroflexion.      Esophagogastric landmarks were identified: the gastroesophageal junction       was found at 39 cm from the incisors.      The gastroesophageal junction and examined  esophagus were normal. Impression:            - Normal duodenal bulb and second portion of the                         duodenum.                        - Normal stomach. Biopsied.                        - Esophagogastric landmarks identified.                        - Normal gastroesophageal junction and esophagus. Recommendation:        - Await pathology results.                        - Proceed with colonoscopy as scheduled                        See colonoscopy report Procedure Code(s):     --- Professional ---                        [phone], Esophagogastroduodenoscopy, flexible,                         transoral; with biopsy, single or multiple Diagnosis Code(s):     --- Professional ---                        D50.9, Iron deficiency anemia, unspecified CPT copyright 2019 American Medical Association. All rights reserved. The codes documented in this report are preliminary and upon coder review may  be revised to meet current compliance requirements. Dr. Ulyess Mort Lin Landsman MD, MD 02/08/2021 8:31:00 AM This report has been signed electronically. Number of Addenda: 0 Note Initiated On: 02/08/2021 8:16 AM Estimated Blood Loss:  Estimated blood loss: none.      [facility]

## 2021-02-08 NOTE — Transfer of Care (Signed)
Immediate Anesthesia Transfer of Care Note  Patient: Lauren Hensley  Procedure(s) Performed: ESOPHAGOGASTRODUODENOSCOPY (EGD) (N/A ) COLONOSCOPY WITH PROPOFOL (N/A )  Patient Location: PACU  Anesthesia Type:General  Level of Consciousness: drowsy and patient cooperative  Airway & Oxygen Therapy: Patient Spontanous Breathing  Post-op Assessment: Report given to RN and Post -op Vital signs reviewed and stable  Post vital signs: Reviewed and stable  Last Vitals:  Vitals Value Taken Time  BP 135/86 02/08/21 0857  Temp    Pulse 53 02/08/21 0859  Resp 16 02/08/21 0859  SpO2 100 % 02/08/21 0859  Vitals shown include unvalidated device data.  Last Pain:  Vitals:   02/08/21 0802  TempSrc: Temporal  PainSc: 0-No pain         Complications: No complications documented.

## 2021-02-08 NOTE — Op Note (Signed)
[facility] Gastroenterology Patient Name: Lauren Hensley Procedure Date: 02/08/2021 8:15 AM MRN: [id number] Account #: [id number] Date of Birth: 07-06-1966 Admit Type: Inpatient Age: 55 Room: [facility] ENDO ROOM 4 Gender: Female Note Status: Finalized Procedure:             Colonoscopy Indications:           This is the patient's first colonoscopy, Unexplained                         iron deficiency anemia Providers:             Lin Landsman MD, MD Referring MD:          No Local Md, MD (Referring MD) Medicines:             General Anesthesia Complications:         No immediate complications. Estimated blood loss: None. Procedure:             Pre-Anesthesia Assessment:                        - Prior to the procedure, a History and Physical was                         performed, and patient medications and allergies were                         reviewed. The patient is competent. The risks and                         benefits of the procedure and the sedation options and                         risks were discussed with the patient. All questions                         were answered and informed consent was obtained.                         Patient identification and proposed procedure were                         verified by the physician, the nurse, the                         anesthesiologist, the anesthetist and the technician                         in the pre-procedure area in the procedure room in the                         endoscopy suite. Mental Status Examination: alert and                         oriented. Airway Examination: normal oropharyngeal                         airway and neck mobility. Respiratory Examination:  clear to auscultation. CV Examination: normal.                         Prophylactic Antibiotics: The patient does not require                         prophylactic antibiotics. Prior Anticoagulants: The                          patient has taken no previous anticoagulant or                         antiplatelet agents. ASA Grade Assessment: III - A                         patient with severe systemic disease. After reviewing                         the risks and benefits, the patient was deemed in                         satisfactory condition to undergo the procedure. The                         anesthesia plan was to use general anesthesia.                         Immediately prior to administration of medications,                         the patient was re-assessed for adequacy to receive                         sedatives. The heart rate, respiratory rate, oxygen                         saturations, blood pressure, adequacy of pulmonary                         ventilation, and response to care were monitored                         throughout the procedure. The physical status of the                         patient was re-assessed after the procedure.                        After obtaining informed consent, the colonoscope was                         passed under direct vision. Throughout the procedure,                         the patient's blood pressure, pulse, and oxygen                         saturations were monitored continuously. The  Colonoscope was introduced through the anus and                         advanced to the the terminal ileum, with                         identification of the appendiceal orifice and IC                         valve. The colonoscopy was performed without                         difficulty. The patient tolerated the procedure well.                         The quality of the bowel preparation was evaluated                         using the BBPS [facility] Bowel Preparation Scale) with                         scores of: Right Colon = 3, Transverse Colon = 3 and                         Left Colon = 3 (entire mucosa seen well with no                          residual staining, small fragments of stool or opaque                         liquid). The total BBPS score equals 9. Findings:      The perianal and digital rectal examinations were normal. Pertinent       negatives include normal sphincter tone and no palpable rectal lesions.      The terminal ileum appeared normal.      The entire examined colon appeared normal.      The retroflexed view of the distal rectum and anal verge was normal and       showed no anal or rectal abnormalities. Impression:            - The examined portion of the ileum was normal.                        - The entire examined colon is normal.                        - The distal rectum and anal verge are normal on                         retroflexion view.                        - No specimens collected. Recommendation:        - Return patient to hospital ward for ongoing care.                        - Full liquid diet today.                        -  To visualize the small bowel, perform video capsule                         endoscopy tomorrow as inpatient as patient is homeless.                        - IV iron Procedure Code(s):     --- Professional ---                        [phone], Colonoscopy, flexible; diagnostic, including                         collection of specimen(s) by brushing or washing, when                         performed (separate procedure) Diagnosis Code(s):     --- Professional ---                        D50.9, Iron deficiency anemia, unspecified CPT copyright 2019 American Medical Association. All rights reserved. The codes documented in this report are preliminary and upon coder review may  be revised to meet current compliance requirements. Dr. Ulyess Mort Lin Landsman MD, MD 02/08/2021 8:53:05 AM This report has been signed electronically. Number of Addenda: 0 Note Initiated On: 02/08/2021 8:15 AM Scope Withdrawal Time: 0 hours 12 minutes 40 seconds  Total Procedure  Duration: 0 hours 15 minutes 43 seconds  Estimated Blood Loss:  Estimated blood loss: none.      [facility]

## 2021-02-09 ENCOUNTER — Encounter
Admission: EM | Disposition: A | Discharge status: Home / Self Care | Payer: Self-pay | Source: Home / Self Care | Attending: Internal Medicine

## 2021-02-09 DIAGNOSIS — R0600 Dyspnea, unspecified: Secondary | ICD-10-CM

## 2021-02-09 HISTORY — PX: GIVENS CAPSULE STUDY: SHX5432

## 2021-02-09 LAB — CBC
HCT: 27.7 % — ABNORMAL LOW (ref 36.0–46.0)
Hemoglobin: 8.3 g/dL — ABNORMAL LOW (ref 12.0–15.0)
MCH: 21.4 pg — ABNORMAL LOW (ref 26.0–34.0)
MCHC: 30 g/dL (ref 30.0–36.0)
MCV: 71.6 fL — ABNORMAL LOW (ref 80.0–100.0)
Platelets: 475 10*3/uL — ABNORMAL HIGH (ref 150–400)
RBC: 3.87 MIL/uL (ref 3.87–5.11)
WBC: 5.2 10*3/uL (ref 4.0–10.5)
nRBC: 0 % (ref 0.0–0.2)

## 2021-02-09 SURGERY — IMAGING PROCEDURE, GI TRACT, INTRALUMINAL, VIA CAPSULE

## 2021-02-09 MED ORDER — POLYSACCHARIDE IRON COMPLEX 150 MG PO CAPS: 150.0000 mg | ORAL_CAPSULE | Freq: Every day | ORAL | 0 refills | Status: DC

## 2021-02-09 MED ORDER — POLYSACCHARIDE IRON COMPLEX 150 MG PO CAPS
150.0000 mg | ORAL_CAPSULE | Freq: Every day | ORAL | Status: DC
  Administered 2021-02-09 – 2021-02-10 (×2): 150 mg via ORAL
  Filled 2021-02-09 (×2): qty 1

## 2021-02-09 NOTE — Discharge Instructions (Signed)
[employer] medicine (25th ed., pp. [phone]). [place], [place]: Elsevier.">  Anemia  Anemia is a condition in which there is not enough red blood cells or hemoglobin in the blood. Hemoglobin is a substance in red blood cells that carries oxygen. When you do not have enough red blood cells or hemoglobin (are anemic), your body cannot get enough oxygen and your organs may not work properly. As a result, you may feel very tired or have other problems. What are the causes? Common causes of anemia include:  Excessive bleeding. Anemia can be caused by excessive bleeding inside or outside the body, including bleeding from the intestines or from heavy menstrual periods in females.  Poor nutrition.  Long-lasting (chronic) kidney, thyroid, and liver disease.  Bone marrow disorders, spleen problems, and blood disorders.  Cancer and treatments for cancer.  HIV (human immunodeficiency virus) and AIDS (acquired immunodeficiency syndrome).  Infections, medicines, and autoimmune disorders that destroy red blood cells. What are the signs or symptoms? Symptoms of this condition include:  Minor weakness.  Dizziness.  Headache, or difficulties concentrating and sleeping.  Heartbeats that feel irregular or faster than normal (palpitations).  Shortness of breath, especially with exercise.  Pale skin, lips, and nails, or cold hands and feet.  Indigestion and nausea. Symptoms may occur suddenly or develop slowly. If your anemia is mild, you may not have symptoms. How is this diagnosed? This condition is diagnosed based on blood tests, your medical history, and a physical exam. In some cases, a test may be needed in which cells are removed from the soft tissue inside of a bone and looked at under a microscope (bone marrow biopsy). Your health care provider may also check your stool (feces) for blood and may do additional testing to look for the cause of your bleeding. Other tests may  include:  Imaging tests, such as a CT scan or MRI.  A procedure to see inside your esophagus and stomach (endoscopy).  A procedure to see inside your colon and rectum (colonoscopy). How is this treated? Treatment for this condition depends on the cause. If you continue to lose a lot of blood, you may need to be treated at a hospital. Treatment may include:  Taking supplements of iron, vitamin Q68, or folic acid.  Taking a hormone medicine (erythropoietin) that can help to stimulate red blood cell growth.  Having a blood transfusion. This may be needed if you lose a lot of blood.  Making changes to your diet.  Having surgery to remove your spleen. Follow these instructions at home:  Take over-the-counter and prescription medicines only as told by your health care provider.  Take supplements only as told by your health care provider.  Follow any diet instructions that you were given by your health care provider.  Keep all follow-up visits as told by your health care provider. This is important. Contact a health care provider if:  You develop new bleeding anywhere in the body. Get help right away if:  You are very weak.  You are short of breath.  You have pain in your abdomen or chest.  You are dizzy or feel faint.  You have trouble concentrating.  You have bloody stools, black stools, or tarry stools.  You vomit repeatedly or you vomit up blood. These symptoms may represent a serious problem that is an emergency. Do not wait to see if the symptoms will go away. Get medical help right away. Call your local emergency services (911 in the [place] Do not  drive yourself to the hospital. Summary  Anemia is a condition in which you do not have enough red blood cells or enough of a substance in your red blood cells that carries oxygen (hemoglobin).  Symptoms may occur suddenly or develop slowly.  If your anemia is mild, you may not have symptoms.  This condition is  diagnosed with blood tests, a medical history, and a physical exam. Other tests may be needed.  Treatment for this condition depends on the cause of the anemia. This information is not intended to replace advice given to you by your health care provider. Make sure you discuss any questions you have with your health care provider. Document Revised: 08/22/2019 Document Reviewed: 08/22/2019 Elsevier Patient Education  2021 Elsevier Inc.  

## 2021-02-09 NOTE — Progress Notes (Signed)
VVMD called to notify of 11 beats PVCs. Arrived in patient's room to find her awake and lying in bed watcjing TV. She denies any chest discomfort. Breathing even and unlabored. Will continue to monitor.

## 2021-02-09 NOTE — Progress Notes (Signed)
PROGRESS NOTE    Lauren Hensley  RUE:[id number] DOB: 05-20-1966 DOA: 02/01/2021 PCP: Pcp, No   Chief Complaint  Patient presents with  . Foot Swelling  Brief Narrative: 55 year old female no known past medical history but has not been seeing any doctors on regular basis presented to the ED with progressive lower extremity edema for few weeks along with shortness of breath with exertion and at rest progressive weakness.  Family has been pushing her to go to see her doctor about seeing has not done so. In the ED found to have severe microcytic anemia with iron deficiency, thrombocytopenia along with bilateral lower extremity edema, occult blood positive in the stool.  She has had no prior colonoscopies.Patient was admitted for further management transfusion.  Patient had 4 units PRBC hemoglobin has been stabilized since.  Subjective: Patient is awake.  Denies any symptoms.  S/p EGD and colonoscopy which were within normal limits, capsule endoscopy today  Assessment & Plan:  Severe microcytic symptomatic anemia Severe iron deficiency anemia-ferritin at 2 Hemoccult positive stool per EDP Status post 4 units PRBC transfusion.   Normal EGD and colonoscopy.  Getting capsule endoscopy today Outpatient OB/GYN follow-up  Recent Labs  Lab 02/04/21 0341 02/05/21 0526 02/06/21 0525 02/07/21 0539 02/09/21 1010  HGB 8.2* 7.6* 7.6* 8.1* 8.3*  HCT 26.8* 25.0* 25.8* 27.1* 27.7*   Generalized weakness  from anemia.  Significantly improved.She has been ambulatory.  Lower extremity swelling for months/Dyspnea on exertion:  due to her severe anemia.Troponin was negative.Echo shows EF of 45 to 50%.  Hypokalemia: Repleted and resolved.    Mildly elevated total bili.Monitor.  Urine drug screen positive with cocaine: Counseled  Thrombocytopenia likely in the setting of iron deficiency Versus acute illness. Improved.  Recent Labs  Lab 02/04/21 0341 02/05/21 0526 02/06/21 0525 02/07/21 0539  02/09/21 1010  PLT 220 273 331 398 475*   Morbid obesity with BMI 33:Will benefit weight loss PCP follow-up.  Diet Order            DIET SOFT Room service appropriate? Yes; Fluid consistency: Thin  Diet effective now                Patient's Body mass index is 33.97 kg/m. DVT prophylaxis: SCDs Start: 02/01/21 2245 Code Status:   Code Status: Full Code  Family Communication: plan of care discussed with patient at bedside.  Status JY:[id number]  Remains inpatient appropriate because:Ongoing diagnostic testing needed not appropriate for outpatient work up, IV treatments appropriate due to intensity of illness or inability to take PO and Inpatient level of care appropriate due to severity of illness Dispo:The patient is from: Home/homeless            Anticipated d/c is YQ:MVHQIONG arrangement. once H&H stable anemia and GI work-up completed            Patient currently is not medically stable to d/c.            Difficult to place patient No   Medications reviewed:  Scheduled Meds:   Consultants: GI  Procedures:see note  Antimicrobials: Anti-infectives (From admission, onward)   None     Culture/Microbiology No results found for: SDES, SPECREQUEST, CULT, REPTSTATUS  Other culture-see note  Objective: Vitals: Today's Vitals   02/09/21 0055 02/09/21 0502 02/09/21 0739 02/09/21 0803  BP:  127/65  108/69  Pulse:  100  (!) 56  Resp:  16  16  Temp:  98.5 F (36.9 C)  97.7 F (36.5 C)  TempSrc:  Oral    SpO2:  96%  96%  Weight:      Height:      PainSc: Asleep  0-No pain     Intake/Output Summary (Last 24 hours) at 02/09/2021 1312 Last data filed at 02/08/2021 1821 Gross per 24 hour  Intake 1860 ml  Output --  Net 1860 ml   Filed Weights   02/01/21 1713  Weight: 104.3 kg   Weight change:   Intake/Output from previous day: 05/14 0701 - 05/15 0700 In: 1860 [P.O.:1860] Out: -  Intake/Output this shift: No intake/output data recorded. Filed Weights    02/01/21 1713  Weight: 104.3 kg    Examination: General exam: AAOx x,obese,older than stated age, weak appearing. HEENT:Oral mucosa moist, Ear/Nose WNL grossly, dentition normal. Respiratory system: bilaterally diminished,no use of accessory muscle Cardiovascular system: S1 & S2 +, No JVD,. Gastrointestinal system: Abdomen soft,NT,ND, BS+ Nervous System: Awake and alert Extremities: no edema, distal peripheral pulses palpable.  Skin: No rashes,no icterus. MSK: Normal muscle bulk,tone, power  Data Reviewed: I have personally reviewed following labs and imaging studies CBC: Recent Labs  Lab 02/04/21 0341 02/05/21 0526 02/06/21 0525 02/07/21 0539 02/09/21 1010  WBC 7.7 7.5 5.7 5.2 5.2  HGB 8.2* 7.6* 7.6* 8.1* 8.3*  HCT 26.8* 25.0* 25.8* 27.1* 27.7*  MCV 70.5* 72.3* 72.3* 72.3* 71.6*  PLT 220 273 331 398 409*   Basic Metabolic Panel: Recent Labs  Lab 02/03/21 0430 02/04/21 0341 02/05/21 0526 02/06/21 0525 02/07/21 0539  NA 139 138 140 140 140  K 3.4* 4.1 3.9 3.9 4.1  CL 109 108 109 109 108  CO2 24 24 24 24 25   GLUCOSE 85 93 88 82 86  BUN 6 7 10 6 7   CREATININE 0.77 0.83 0.73 0.74 0.72  CALCIUM 8.9 8.7* 8.7* 8.9 9.1   GFR: Estimated Creatinine Clearance: 105.7 mL/min (by C-G formula based on SCr of 0.72 mg/dL). Liver Function Tests: No results for input(s): AST, ALT, ALKPHOS, BILITOT, PROT, ALBUMIN in the last 168 hours. No results for input(s): LIPASE, AMYLASE in the last 168 hours. No results for input(s): AMMONIA in the last 168 hours. Coagulation Profile: No results for input(s): INR, PROTIME in the last 168 hours. Cardiac Enzymes: No results for input(s): CKTOTAL, CKMB, CKMBINDEX, TROPONINI in the last 168 hours. BNP (last 3 results) No results for input(s): PROBNP in the last 8760 hours. HbA1C: No results for input(s): HGBA1C in the last 72 hours. CBG: No results for input(s): GLUCAP in the last 168 hours. Lipid Profile: No results for input(s): CHOL,  HDL, LDLCALC, TRIG, CHOLHDL, LDLDIRECT in the last 72 hours. Thyroid Function Tests: No results for input(s): TSH, T4TOTAL, FREET4, T3FREE, THYROIDAB in the last 72 hours. Anemia Panel: No results for input(s): VITAMINB12, FOLATE, FERRITIN, TIBC, IRON, RETICCTPCT in the last 72 hours. Sepsis Labs: No results for input(s): PROCALCITON, LATICACIDVEN in the last 168 hours.  Recent Results (from the past 240 hour(s))  Resp Panel by RT-PCR (Flu A&B, Covid) Nasopharyngeal Swab     Status: None   Collection Time: 02/03/21  8:37 AM   Specimen: Nasopharyngeal Swab; Nasopharyngeal(NP) swabs in vial transport medium  Result Value Ref Range Status   SARS Coronavirus 2 by RT PCR NEGATIVE NEGATIVE Final    Comment: (NOTE) SARS-CoV-2 target nucleic acids are NOT DETECTED.  The SARS-CoV-2 RNA is generally detectable in upper respiratory specimens during the acute phase of infection. The lowest concentration of SARS-CoV-2 viral copies this assay can detect is 138 copies/mL.  A negative result does not preclude SARS-Cov-2 infection and should not be used as the sole basis for treatment or other patient management decisions. A negative result may occur with  improper specimen collection/handling, submission of specimen other than nasopharyngeal swab, presence of viral mutation(s) within the areas targeted by this assay, and inadequate number of viral copies(<138 copies/mL). A negative result must be combined with clinical observations, patient history, and epidemiological information. The expected result is Negative.  Fact Sheet for Patients:  EntrepreneurPulse.com.au  Fact Sheet for Healthcare Providers:  IncredibleEmployment.be  This test is no t yet approved or cleared by the [place] FDA and  has been authorized for detection and/or diagnosis of SARS-CoV-2 by FDA under an Emergency Use Authorization (EUA). This EUA will remain  in effect (meaning this  test can be used) for the duration of the COVID-19 declaration under Section 564(b)(1) of the Act, 21 U.S.C.section 360bbb-3(b)(1), unless the authorization is terminated  or revoked sooner.       Influenza A by PCR NEGATIVE NEGATIVE Final   Influenza B by PCR NEGATIVE NEGATIVE Final    Comment: (NOTE) The Xpert Xpress SARS-CoV-2/FLU/RSV plus assay is intended as an aid in the diagnosis of influenza from Nasopharyngeal swab specimens and should not be used as a sole basis for treatment. Nasal washings and aspirates are unacceptable for Xpert Xpress SARS-CoV-2/FLU/RSV testing.  Fact Sheet for Patients: EntrepreneurPulse.com.au  Fact Sheet for Healthcare Providers: IncredibleEmployment.be  This test is not yet approved or cleared by the [place] FDA and has been authorized for detection and/or diagnosis of SARS-CoV-2 by FDA under an Emergency Use Authorization (EUA). This EUA will remain in effect (meaning this test can be used) for the duration of the COVID-19 declaration under Section 564(b)(1) of the Act, 21 U.S.C. section 360bbb-3(b)(1), unless the authorization is terminated or revoked.  Performed at [facility], [address] [place],  [address]      Radiology Studies: No results found.   LOS: 8 days   Max Sane, MD [facility]  02/09/2021, 1:12 PM

## 2021-02-09 NOTE — Plan of Care (Signed)
  Problem: Bowel/Gastric: Goal: Will show no signs and symptoms of gastrointestinal bleeding Outcome: Progressing   Problem: Education: Goal: Knowledge of General Education information will improve Description: Including pain rating scale, medication(s)/side effects and non-pharmacologic comfort measures Outcome: Progressing   Problem: Activity: Goal: Risk for activity intolerance will decrease Outcome: Progressing   Problem: Nutrition: Goal: Adequate nutrition will be maintained Outcome: Progressing   Problem: Elimination: Goal: Will not experience complications related to bowel motility Outcome: Progressing Goal: Will not experience complications related to urinary retention Outcome: Progressing

## 2021-02-09 NOTE — Progress Notes (Signed)
CCMD called to notify that pt hr sustaining 140s. This RN arrived in pts room to find her standing up and ready to use toilet. She denies any discomfort. Breathing even and unlabored. Will continue to monitor for reduction in heart rate.

## 2021-02-10 ENCOUNTER — Encounter: Payer: Self-pay | Admitting: Gastroenterology

## 2021-02-10 ENCOUNTER — Other Ambulatory Visit: Payer: Self-pay

## 2021-02-10 DIAGNOSIS — R600 Localized edema: Secondary | ICD-10-CM

## 2021-02-10 DIAGNOSIS — R609 Edema, unspecified: Secondary | ICD-10-CM

## 2021-02-10 LAB — CBC
HCT: 30.2 % — ABNORMAL LOW (ref 36.0–46.0)
Hemoglobin: 9 g/dL — ABNORMAL LOW (ref 12.0–15.0)
MCH: 21.3 pg — ABNORMAL LOW (ref 26.0–34.0)
MCHC: 29.8 g/dL — ABNORMAL LOW (ref 30.0–36.0)
MCV: 71.4 fL — ABNORMAL LOW (ref 80.0–100.0)
Platelets: 521 10*3/uL — ABNORMAL HIGH (ref 150–400)
RBC: 4.23 MIL/uL (ref 3.87–5.11)
WBC: 5.8 10*3/uL (ref 4.0–10.5)
nRBC: 0 % (ref 0.0–0.2)

## 2021-02-10 LAB — BASIC METABOLIC PANEL
Anion gap: 6 (ref 5–15)
BUN: 7 mg/dL (ref 6–20)
CO2: 25 mmol/L (ref 22–32)
Calcium: 9.2 mg/dL (ref 8.9–10.3)
Chloride: 108 mmol/L (ref 98–111)
Creatinine, Ser: 0.74 mg/dL (ref 0.44–1.00)
GFR, Estimated: >60 mL/min (ref >60)
Glucose, Bld: 90 mg/dL (ref 70–99)
Potassium: 4.2 mmol/L (ref 3.5–5.1)
Sodium: 139 mmol/L (ref 135–145)

## 2021-02-10 MED ORDER — METOPROLOL SUCCINATE ER 25 MG PO TB24
25.0000 mg | ORAL_TABLET | Freq: Every day | ORAL | Status: DC
  Administered 2021-02-10: 25 mg via ORAL
  Filled 2021-02-10: qty 1

## 2021-02-10 MED ORDER — POLYSACCHARIDE IRON COMPLEX 150 MG PO CAPS
150.0000 mg | ORAL_CAPSULE | Freq: Every day | ORAL | 0 refills | Status: DC
  Filled 2021-02-10: qty 30, 30d supply, fill #0

## 2021-02-10 MED ORDER — METOPROLOL SUCCINATE ER 25 MG PO TB24
25.0000 mg | ORAL_TABLET | Freq: Every day | ORAL | 0 refills | Status: DC
  Filled 2021-02-10: qty 30, 30d supply, fill #0

## 2021-02-10 NOTE — Plan of Care (Signed)
Patient discharged home per MD orders at this time.All discharge instructions,education and medications reviewed with patient.Pt expressed understanding and will comply with dc instructions.Follow up appointments also communicated to patient.no verbal c/o or any ssx of distress.Pt transported home by family in a private car.

## 2021-02-10 NOTE — Consult Note (Signed)
[facility] Consultation Note  Patient ID: Lauren Hensley, MRN: [id number], DOB/AGE: 53-Mar-1967 53 y.o. Admit date: 02/01/2021   Date of Consult: 02/10/2021 Primary Physician: Pcp, No Primary Cardiologist: None  Chief Complaint:  Chief Complaint  Patient presents with  . Foot Swelling   Reason for Consult: Rhythm disturbances  HPI: 55 y.o. female with no evidence of previous cardiovascular history who has been admitted to the hospital due to her claiming that she is short of breath when doing activities to a significant extent as well as having some lower extremity edema.  When then admitted to the hospital the patient had microcytic anemia and received several units of packed red blood cells.  She claims that her lower extremity edema has significantly improved and she feels much better.  At this time the patient has had no evidence of chest pain PND orthopnea syncope dizziness nausea diaphoresis.  Her ambulation may have slowly improved although she has not been into rehab yet.  The patient did have a BNP of 334 and an echocardiogram showing mild global LV systolic dysfunction ejection fraction 45% with moderate mitral regurgitation.  Further evaluation shows that on telemetry the patient has episodes of short runs of atrial flutter with 2-1 block, short runs of atrial fibrillation preatrial and preventricular contractions and short runs of ectopic atrial rhythm.  All of these are in the setting of receiving packed red blood cells and anemia and currently the patient feels well and has not had any episodes for which she has had any concerns.  There is no evidence of palpitations or other irregular heartbeats.  The patient does have an EKG today showing normal sinus rhythm left atrial enlargement and preventricular contractions.  She is hemodynamically stable  History reviewed. No pertinent past medical history.    Surgical History: History reviewed. No pertinent surgical history.    Home Meds: Prior to Admission medications   Medication Sig Start Date End Date Taking? Authorizing Provider  iron polysaccharides (NIFEREX) 150 MG capsule Take 1 capsule (150 mg total) by mouth daily. 02/10/21 03/12/21  Max Sane, MD    Inpatient Medications:  . iron polysaccharides  150 mg Oral Daily     Allergies: No Known Allergies  Social History   Socioeconomic History  . Marital status: Single    Spouse name: Not on file  . Number of children: Not on file  . Years of education: Not on file  . Highest education level: Not on file  Occupational History  . Not on file  Tobacco Use  . Smoking status: Never Smoker  . Smokeless tobacco: Never Used  Substance and Sexual Activity  . Alcohol use: Yes    Comment: occ  . Drug use: Yes    Types: Marijuana, Cocaine  . Sexual activity: Not on file  Other Topics Concern  . Not on file  Social History Narrative  . Not on file   Social Determinants of Health   Financial Resource Strain: Not on file  Food Insecurity: Not on file  Transportation Needs: Not on file  Physical Activity: Not on file  Stress: Not on file  Social Connections: Not on file  Intimate Partner Violence: Not on file     History reviewed. No pertinent family history.   Review of Systems Positive for lower extremity edema shortness of breath Negative for: General:  chills, fever, night sweats or weight changes.  Cardiovascular: PND orthopnea syncope dizziness  Dermatological skin lesions rashes Respiratory: Cough congestion Urologic: Frequent  urination urination at night and hematuria Abdominal: negative for nausea, vomiting, diarrhea, bright red blood per rectum, melena, or hematemesis Neurologic: negative for visual changes, and/or hearing changes  All other systems reviewed and are otherwise negative except as noted above.  Labs: No results for input(s): CKTOTAL, CKMB, TROPONINI in the last 72 hours. Lab Results  Component Value Date    WBC 5.8 02/10/2021   HGB 9.0 (L) 02/10/2021   HCT 30.2 (L) 02/10/2021   MCV 71.4 (L) 02/10/2021   PLT 521 (H) 02/10/2021    Recent Labs  Lab 02/10/21 0433  NA 139  K 4.2  CL 108  CO2 25  BUN 7  CREATININE 0.74  CALCIUM 9.2  GLUCOSE 90   No results found for: CHOL, HDL, LDLCALC, TRIG No results found for: DDIMER  Radiology/Studies:  DG Chest 2 View  Result Date: 02/01/2021 CLINICAL DATA:  Weakness and peripheral edema EXAM: CHEST - 2 VIEW COMPARISON:  None. FINDINGS: Cardiac shadow is enlarged. Mild vascular congestion is noted without parenchymal edema. No sizable effusion is seen. No infiltrate is noted. Degenerative change of the thoracic spine is seen. IMPRESSION: Mild vascular congestion.  No other focal abnormality is noted. Electronically Signed   By: Inez Catalina M.D.   On: 02/01/2021 20:07   ECHOCARDIOGRAM COMPLETE  Result Date: 02/04/2021    ECHOCARDIOGRAM REPORT   Patient Name:   Lauren Hensley Date of Exam: 02/03/2021 Medical Rec #:  [id number]    Height:       69.0 in Accession #:    [id number]   Weight:       230.0 lb Date of Birth:  August 12, 1966   BSA:          2.192 m Patient Age:    55 years     BP:           149/82 mmHg Patient Gender: F            HR:           63 bpm. Exam Location:  [facility] Procedure: 2D Echo, Color Doppler and Cardiac Doppler Indications:     I50.9 Congestive Heart Failure  History:         Patient has no prior history of Echocardiogram examinations.                  Signs/Symptoms:Edema.  Sonographer:     Charmayne Sheer RDCS (AE) Referring Phys:  [id number] [facility] Diagnosing Phys: Isaias Cowman MD IMPRESSIONS  1. Left ventricular ejection fraction, by estimation, is 45 to 50%. The left ventricle has mildly decreased function. The left ventricle has no regional wall motion abnormalities. Left ventricular diastolic parameters were normal.  2. Right ventricular systolic function is normal. The right ventricular size is normal.  3. The mitral valve is normal in  structure. Moderate mitral valve regurgitation. No evidence of mitral stenosis.  4. Tricuspid valve regurgitation is moderate to severe.  5. The aortic valve is normal in structure. Aortic valve regurgitation is not visualized. No aortic stenosis is present.  6. The inferior vena cava is normal in size with greater than 50% respiratory variability, suggesting right atrial pressure of 3 mmHg. FINDINGS  Left Ventricle: Left ventricular ejection fraction, by estimation, is 45 to 50%. The left ventricle has mildly decreased function. The left ventricle has no regional wall motion abnormalities. The left ventricular internal cavity size was normal in size. There is no left ventricular hypertrophy. Left ventricular diastolic parameters were normal. Right  Ventricle: The right ventricular size is normal. No increase in right ventricular wall thickness. Right ventricular systolic function is normal. Left Atrium: Left atrial size was normal in size. Right Atrium: Right atrial size was normal in size. Pericardium: There is no evidence of pericardial effusion. Mitral Valve: The mitral valve is normal in structure. Moderate mitral valve regurgitation. No evidence of mitral valve stenosis. MV peak gradient, 4.4 mmHg. The mean mitral valve gradient is 1.0 mmHg. Tricuspid Valve: The tricuspid valve is normal in structure. Tricuspid valve regurgitation is moderate to severe. No evidence of tricuspid stenosis. Aortic Valve: The aortic valve is normal in structure. Aortic valve regurgitation is not visualized. No aortic stenosis is present. Aortic valve mean gradient measures 3.0 mmHg. Aortic valve peak gradient measures 6.1 mmHg. Aortic valve area, by VTI measures 3.52 cm. Pulmonic Valve: The pulmonic valve was normal in structure. Pulmonic valve regurgitation is not visualized. No evidence of pulmonic stenosis. Aorta: The aortic root is normal in size and structure. Venous: The inferior vena cava is normal in size with greater than  50% respiratory variability, suggesting right atrial pressure of 3 mmHg. IAS/Shunts: No atrial level shunt detected by color flow Doppler.  LEFT VENTRICLE PLAX 2D LVIDd:         5.40 cm  Diastology LVIDs:         4.00 cm  LV e' medial:    9.25 cm/s LV PW:         1.60 cm  LV E/e' medial:  9.9 LV IVS:        1.00 cm  LV e' lateral:   15.60 cm/s LVOT diam:     2.40 cm  LV E/e' lateral: 5.9 LV SV:         75 LV SV Index:   34 LVOT Area:     4.52 cm  RIGHT VENTRICLE RV Basal diam:  3.90 cm LEFT ATRIUM              Index       RIGHT ATRIUM           Index LA diam:        4.40 cm  2.01 cm/m  RA Area:     26.40 cm LA Vol (A2C):   103.0 ml 46.98 ml/m RA Volume:   88.40 ml  40.32 ml/m LA Vol (A4C):   103.0 ml 46.98 ml/m LA Biplane Vol: 109.0 ml 49.72 ml/m  AORTIC VALVE                   PULMONIC VALVE AV Area (Vmax):    3.07 cm    PV Vmax:       0.76 m/s AV Area (Vmean):   2.97 cm    PV Vmean:      51.100 cm/s AV Area (VTI):     3.52 cm    PV VTI:        0.148 m AV Vmax:           123.00 cm/s PV Peak grad:  2.3 mmHg AV Vmean:          82.300 cm/s PV Mean grad:  1.0 mmHg AV VTI:            0.212 m AV Peak Grad:      6.1 mmHg AV Mean Grad:      3.0 mmHg LVOT Vmax:         83.60 cm/s LVOT Vmean:        54.000  cm/s LVOT VTI:          0.165 m LVOT/AV VTI ratio: 0.78  AORTA Ao Root diam: 3.40 cm MITRAL VALVE               TRICUSPID VALVE MV Area (PHT): 6.43 cm    TR Peak grad:   48.2 mmHg MV Area VTI:   2.64 cm    TR Vmax:        347.00 cm/s MV Peak grad:  4.4 mmHg MV Mean grad:  1.0 mmHg    SHUNTS MV Vmax:       1.05 m/s    Systemic VTI:  0.16 m MV Vmean:      54.6 cm/s   Systemic Diam: 2.40 cm MV Decel Time: 118 msec MV E velocity: 91.70 cm/s MV A velocity: 48.80 cm/s MV E/A ratio:  1.88 Isaias Cowman MD Electronically signed by Isaias Cowman MD Signature Date/Time: 02/04/2021/1:19:40 PM    Final    US Abdomen Limited RUQ (LIVER/GB)  Result Date: 02/02/2021 CLINICAL DATA:  Cirrhosis. EXAM: ULTRASOUND  ABDOMEN LIMITED RIGHT UPPER QUADRANT COMPARISON:  None. FINDINGS: Gallbladder: Multiple stones, largest 2.4 cm. Probable intervening sludge. Mild area wall prominence up to 4 mm, remainder of the fall normal in thickness. No pericholecystic fluid. No sonographic Murphy's sign. Common bile duct: Diameter: 3 mm Liver: Normal size. Coarsened echotexture with mild generalized increased parenchymal echogenicity. No masses. Portal vein is patent on color Doppler imaging with normal direction of blood flow towards the liver. Other: None. IMPRESSION: 1. No acute findings. 2. Coarsened liver echotexture with mild generalized increased liver parenchymal echogenicity. Findings consistent with the history of cirrhosis likely with a component of hepatic steatosis. No liver masses. 3. Multiple gallstones.  No acute cholecystitis. Electronically Signed   By: Lajean Manes M.D.   On: 02/02/2021 10:00    EKG: Normal sinus rhythm with left atrial enlargement and preventricular contraction  Weights: Filed Weights   02/01/21 1713  Weight: 104.3 kg     Physical Exam: Blood pressure (!) 109/49, pulse (!) 103, temperature 98.2 F (36.8 C), resp. rate 16, height 5\' 9"  (1.753 m), weight 104.3 kg, SpO2 96 %. Body mass index is 33.97 kg/m. General: Well developed, well nourished, in no acute distress. Head eyes ears nose throat: Normocephalic, atraumatic, sclera non-icteric, no xanthomas, nares are without discharge. No apparent thyromegaly and/or mass  Lungs: Normal respiratory effort.  no wheezes, no rales, no rhonchi.  Heart: RRR with normal S1 S2. no murmur gallop, no rub, PMI is normal size and placement, carotid upstroke normal without bruit, jugular venous pressure is normal Abdomen: Soft, non-tender, non-distended with normoactive bowel sounds. No hepatomegaly. No rebound/guarding. No obvious abdominal masses. Abdominal aorta is normal size without bruit Extremities: No edema. no cyanosis, no clubbing, no ulcers   Peripheral : 2+ bilateral upper extremity pulses, 2+ bilateral femoral pulses, 2+ bilateral dorsal pedal pulse Neuro: Alert and oriented. No facial asymmetry. No focal deficit. Moves all extremities spontaneously. Musculoskeletal: Normal muscle tone without kyphosis Psych:  Responds to questions appropriately with a normal affect.    Assessment: 55 year old female with no evidence of previous cardiovascular history having microcytic anemia likely causing shortness of breath weakness and fatigue as well as lower extremity edema slowly improving after appropriate treatment having incidental findings of rhythm disturbances listed above including atrial flutter atrial fibrillation.  Atrial contractions preventricular contractions and some short runs of ectopic atrial rhythm all possibly consistent with current condition without evidence of congestive  heart failure and/or angina  Plan: 1.  Continue supportive care and treatment microcytic anemia which is likely the cause of most of these issues and hopefully will fully resolve 2.  Low-dose beta-blocker for heart rate control and rhythm control 3.  No further cardiac diagnostics necessary at this time 4.  Begin ambulation and follow-up for improvements of symptoms and if patient is ambulating well with no evidence of significant symptoms okay for discharge home and will reevaluate at a different setting with Holter monitor to assess need for other medication management  Signed, Corey Skains M.D. [facility] 02/10/2021, 6:46 AM

## 2021-02-10 NOTE — TOC Progression Note (Signed)
Transition of Care [facility]) - Progression Note    Patient Details  Name: Lauren Hensley MRN: [id number] Date of Birth: 08-Oct-1965  Transition of Care [facility]) CM/SW [place], RN Phone Number: 02/10/2021, 4:00 PM  Clinical Narrative:   Delivered the patient's meds from, [facility] into the room for the patient to take home        Expected Discharge Plan and Services           Expected Discharge Date: 02/10/21                                     Social Determinants of Health (SDOH) Interventions    Readmission Risk Interventions No flowsheet data found.

## 2021-02-10 NOTE — TOC Progression Note (Signed)
Transition of Care [facility]) - Progression Note    Patient Details  Name: Lauren Hensley MRN: [id number] Date of Birth: November 28, 1965  Transition of Care [facility]) CM/SW [place], RN Phone Number: 02/10/2021, 1:40 PM  Clinical Narrative:     The patient's medications were sent to [facility] and will be picked up around 215 to be delivered to the patient prior to her DC home      Expected Discharge Plan and Services           Expected Discharge Date: 02/10/21                                     Social Determinants of Health (SDOH) Interventions    Readmission Risk Interventions No flowsheet data found.

## 2021-02-12 ENCOUNTER — Telehealth: Payer: Self-pay

## 2021-02-12 LAB — SURGICAL PATHOLOGY

## 2021-02-12 NOTE — Telephone Encounter (Signed)
Per Dr. Marius Ditch patient is homeless. There is no emergency contact or any way to get in contact with the patient

## 2021-02-12 NOTE — Telephone Encounter (Signed)
-----   Message from Lin Landsman, MD sent at 02/12/2021  2:54 PM EDT ----- Gastric biopsies confirm presence of Helicobacter pylori infection.  Plz send her the prescription for triple therapy to treat H Pylori for 14days  Omeprazole 20mg  BID Clarithromycin 500mg  BID Amoxicillin 1gm BID  She should follow-up with Dr. [facility] gastroenterology  Thanks RV

## 2021-02-12 NOTE — Discharge Summary (Signed)
[facility] [place] NAME: Lauren Hensley    MR#:  [id number]  DATE OF BIRTH:  07/21/1966  DATE OF ADMISSION:  02/01/2021   ADMITTING PHYSICIAN: Elwyn Reach, MD  DATE OF DISCHARGE: 02/10/2021  5:05 PM  PRIMARY CARE PHYSICIAN: Pcp, No   ADMISSION DIAGNOSIS:  Peripheral edema [R60.9] Stool guaiac positive [R19.5] Symptomatic anemia [D64.9] DISCHARGE DIAGNOSIS:  Principal Problem:   Symptomatic anemia Active Problems:   Weakness   Acute CHF (congestive heart failure) (HCC)   Stool guaiac positive   Thrombocytopenia (HCC)   Peripheral edema  SECONDARY DIAGNOSIS:  History reviewed. No pertinent past medical history. HOSPITAL COURSE:  55 year old female no known past medical history admitted with progressive lower extremity edema for few weeks along with shortness of breath with exertion and at rest progressive weakness. In the ED found to have severe microcytic anemia with iron deficiency, thrombocytopenia along with bilateral lower extremity edema, occult blood positive in the stool.   Severe microcytic symptomatic anemia Severe iron deficiency anemia-ferritin at 2 Hemoccult positive stool per EDP Hb 4.8 on admission Status post 4 units PRBC transfusion with appropriate increase in Hb.   Biggest challenge had been her UDS kept coming positive for cocaine till 5/13 so anesthesia won't clear her for endoscopy.  Normal EGD and colonoscopy on 5/14.  s/p capsule endoscopy and pending results as Dr Haig Prophet hasn't read it yet but he's ok with her D/C. He will f/up on results once read the capsule study. Outpatient OB/GYN follow-up  Generalized weakness  from anemia.  Significantly improved.She has been ambulatory.  Lower extremity swelling for months/Dyspnea on exertion:  due to her severe anemia.Troponin was negative.Echo shows EF of 45 to 50%.  Hypokalemia: Repleted and resolved.    Mildly elevated total bili. Improved.  Urine drug screen  positive with cocaine: Counseled about drugs of abuse.  Thrombocytopenia likely in the setting of iron deficiency Improved.  DISCHARGE CONDITIONS:  stable CONSULTS OBTAINED:   DRUG ALLERGIES:  No Known Allergies DISCHARGE MEDICATIONS:   Allergies as of 02/10/2021   No Known Allergies     Medication List    TAKE these medications   Ferrex 150 150 MG capsule Generic drug: iron polysaccharides Take 1 capsule (150 mg total) by mouth daily.   metoprolol succinate 25 MG 24 hr tablet Commonly known as: TOPROL-XL Take 1 tablet (25 mg total) by mouth daily.      DISCHARGE INSTRUCTIONS:   DIET:  Regular diet DISCHARGE CONDITION:  Good ACTIVITY:  Activity as tolerated OXYGEN:  Home Oxygen: No.  Oxygen Delivery: room air DISCHARGE LOCATION:  home   If you experience worsening of your admission symptoms, develop shortness of breath, life threatening emergency, suicidal or homicidal thoughts you must seek medical attention immediately by calling 911 or calling your MD immediately  if symptoms less severe.  You Must read complete instructions/literature along with all the possible adverse reactions/side effects for all the Medicines you take and that have been prescribed to you. Take any new Medicines after you have completely understood and accpet all the possible adverse reactions/side effects.   Please note  You were cared for by a hospitalist during your hospital stay. If you have any questions about your discharge medications or the care you received while you were in the hospital after you are discharged, you can call the unit and asked to speak with the hospitalist on call if the hospitalist that took care of you is not available.  Once you are discharged, your primary care physician will handle any further medical issues. Please note that NO REFILLS for any discharge medications will be authorized once you are discharged, as it is imperative that you return to your primary  care physician (or establish a relationship with a primary care physician if you do not have one) for your aftercare needs so that they can reassess your need for medications and monitor your lab values.    On the day of Discharge:  VITAL SIGNS:  Blood pressure (!) 127/105, pulse 89, temperature 98.5 F (36.9 C), resp. rate 18, height 5\' 9"  (1.753 m), weight 104.3 kg, SpO2 99 %. PHYSICAL EXAMINATION:  GENERAL:  55 y.o.-year-old patient lying in the bed with no acute distress.  EYES: Pupils equal, round, reactive to light and accommodation. No scleral icterus. Extraocular muscles intact.  HEENT: Head atraumatic, normocephalic. Oropharynx and nasopharynx clear.  NECK:  Supple, no jugular venous distention. No thyroid enlargement, no tenderness.  LUNGS: Normal breath sounds bilaterally, no wheezing, rales,rhonchi or crepitation. No use of accessory muscles of respiration.  CARDIOVASCULAR: S1, S2 normal. No murmurs, rubs, or gallops.  ABDOMEN: Soft, non-tender, non-distended. Bowel sounds present. No organomegaly or mass.  EXTREMITIES: No pedal edema, cyanosis, or clubbing.  NEUROLOGIC: Cranial nerves II through XII are intact. Muscle strength 5/5 in all extremities. Sensation intact. Gait not checked.  PSYCHIATRIC: The patient is alert and oriented x 3.  SKIN: No obvious rash, lesion, or ulcer.  DATA REVIEW:   CBC Recent Labs  Lab 02/10/21 0433  WBC 5.8  HGB 9.0*  HCT 30.2*  PLT 521*    Chemistries  Recent Labs  Lab 02/10/21 0433  NA 139  K 4.2  CL 108  CO2 25  GLUCOSE 90  BUN 7  CREATININE 0.74  CALCIUM 9.2     Outpatient follow-up  Follow-up Information    [facility]. Schedule an appointment as soon as possible for a visit in 1 week(s).   Specialty: Obstetrics and Gynecology Contact information: [place] [address] [phone]       Corey Skains, MD Follow up in 1 week(s).   Specialty:  Cardiology Contact information: [facility] [place] [address] [phone]        Lesly Rubenstein, MD. Schedule an appointment as soon as possible for a visit in 2 week(s).   Specialty: Gastroenterology Contact information: [place] [address] [phone]               13 Day Unplanned Readmission Risk Score   Flowsheet Row ED to Hosp-Admission (Discharged) from 02/01/2021 in [facility] (1A)  30 Day Unplanned Readmission Risk Score (%) 9.72 Filed at 02/10/2021 1600     This score is the patient's risk of an unplanned readmission within 30 days of being discharged (0 -100%). The score is based on dignosis, age, lab data, medications, orders, and past utilization.   Low:  0-14.9   Medium: 15-21.9   High: 22-29.9   Extreme: 30 and above         Management plans discussed with the patient, family and they are in agreement.  CODE STATUS: Prior   TOTAL TIME TAKING CARE OF THIS PATIENT: 45 minutes.    Max Sane M.D on 02/12/2021 at 8:51 AM  [facility]   CC: Primary care physician; Pcp, No   Note: This dictation was prepared with Dragon dictation along  with smaller phrase technology. Any transcriptional errors that result from this process are unintentional.

## 2021-02-18 ENCOUNTER — Encounter: Payer: Self-pay | Admitting: Obstetrics and Gynecology

## 2021-02-26 DIAGNOSIS — I428 Other cardiomyopathies: Secondary | ICD-10-CM | POA: Insufficient documentation

## 2021-02-26 DIAGNOSIS — R001 Bradycardia, unspecified: Secondary | ICD-10-CM | POA: Insufficient documentation

## 2021-02-26 DIAGNOSIS — I340 Nonrheumatic mitral (valve) insufficiency: Secondary | ICD-10-CM | POA: Insufficient documentation

## 2021-02-26 DIAGNOSIS — I071 Rheumatic tricuspid insufficiency: Secondary | ICD-10-CM | POA: Insufficient documentation

## 2021-02-26 DIAGNOSIS — R0602 Shortness of breath: Secondary | ICD-10-CM | POA: Insufficient documentation

## 2021-03-05 ENCOUNTER — Other Ambulatory Visit: Payer: Self-pay

## 2021-03-05 ENCOUNTER — Encounter: Payer: Self-pay | Admitting: Obstetrics and Gynecology

## 2021-03-05 ENCOUNTER — Ambulatory Visit (INDEPENDENT_AMBULATORY_CARE_PROVIDER_SITE_OTHER): Payer: Self-pay | Admitting: Obstetrics and Gynecology

## 2021-03-05 ENCOUNTER — Other Ambulatory Visit (HOSPITAL_COMMUNITY)
Admission: RE | Admit: 2021-03-05 | Discharge: 2021-03-05 | Disposition: A | Discharge status: Home / Self Care | Payer: Self-pay | Source: Ambulatory Visit | Attending: Obstetrics and Gynecology | Admitting: Obstetrics and Gynecology

## 2021-03-05 VITALS — BP 150/80 | Ht 68.0 in | Wt 291.8 lb

## 2021-03-05 DIAGNOSIS — D500 Iron deficiency anemia secondary to blood loss (chronic): Secondary | ICD-10-CM

## 2021-03-05 DIAGNOSIS — Z124 Encounter for screening for malignant neoplasm of cervix: Secondary | ICD-10-CM | POA: Insufficient documentation

## 2021-03-05 DIAGNOSIS — N939 Abnormal uterine and vaginal bleeding, unspecified: Secondary | ICD-10-CM

## 2021-03-05 DIAGNOSIS — N852 Hypertrophy of uterus: Secondary | ICD-10-CM

## 2021-03-05 DIAGNOSIS — Z1231 Encounter for screening mammogram for malignant neoplasm of breast: Secondary | ICD-10-CM

## 2021-03-05 MED ORDER — MEDROXYPROGESTERONE ACETATE 150 MG/ML IM SUSP
150.0000 mg | INTRAMUSCULAR | 0 refills | Status: DC
  Filled 2021-03-05 – 2021-03-10 (×2): qty 1, 90d supply, fill #0

## 2021-03-05 NOTE — Progress Notes (Signed)
Patient ID: Lauren Hensley, female   DOB: 03/18/1966, 55 y.o.   MRN: [id number]  Reason for Consult: Gynecologic Exam   Referred by No ref. provider found  Subjective:     HPI:  Lauren Hensley is a 55 y.o. female.  She is following up today after hospitalization last month.  She was hospitalized for severe anemia with a hemoglobin of 5.3.  She received transfusions and her condition improved.  She was evaluated by GI and underwent endoscopy and colonoscopy with and without evidence of acute blood loss as a source.  She reports that she has been having heavy menstrual cycles.  She reports that she has been having heavy menstrual cycles for about a year and a half.  She reports that the menstrual cycles occur on a monthly basis.  They generally last 6 to 7 days.  She passes large clots and have sensation of gushing and flooding.  She wears 2 pads which she has to change frequently more frequently than every hour.  She sometimes has accidents where she bleeds through her clothing.  In a day she will go through about half a box of pads.  Sometimes she has pain that limits her activities. She has not seen a gynecologist on 20 years since she delivered her son.  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  She reports that she last used cannabis or cocaine about a month ago.  She has been staying with her brother since she left the hospital.  Gynecological History  No LMP recorded. Patient is postmenopausal. Menarche: unsure  She reports passage of large clots She reports sensations of gushing or flooding of blood. She reports accidents where she bleeds through her clothing. She reports that she changes a saturated pad or tampon more frequently than every hour.  She reports that pain from her periods limits her activities.  History of fibroids, polyps, or ovarian cysts? : no  History of PCOS? no Hstory of Endometriosis? no History of abnormal pap smears? no Have you had any  sexually transmitted infections in the past? no   Last Pap: Results were: unavailable  She identifies as a female. She is sexually active.   She denies dyspareunia.  Obstetrical History OB History  Gravida Para Term Preterm AB Living  1 1 1     1   SAB IAB Ectopic Multiple Live Births          1    # Outcome Date GA Lbr Len/2nd Weight Sex Delivery Anes PTL Lv  1 Term 04/28/00    Jerilynn Mages Vag-Spont   LIV     Past Medical History:  Diagnosis Date  . Hypertension    History reviewed. No pertinent family history. Past Surgical History:  Procedure Laterality Date  . COLONOSCOPY WITH PROPOFOL N/A 02/08/2021   Procedure: COLONOSCOPY WITH PROPOFOL;  Surgeon: Lin Landsman, MD;  Location: [facility] ENDOSCOPY;  Service: Gastroenterology;  Laterality: N/A;  . ESOPHAGOGASTRODUODENOSCOPY N/A 02/08/2021   Procedure: ESOPHAGOGASTRODUODENOSCOPY (EGD);  Surgeon: Lin Landsman, MD;  Location: [facility] ENDOSCOPY;  Service: Gastroenterology;  Laterality: N/A;  . GIVENS CAPSULE STUDY N/A 02/09/2021   Procedure: GIVENS CAPSULE STUDY;  Surgeon: Lesly Rubenstein, MD;  Location: [facility] ENDOSCOPY;  Service: Endoscopy;  Laterality: N/A;    Short Social History:  Social History   Tobacco Use  . Smoking status: Never Smoker  . Smokeless tobacco: Never Used  Substance Use Topics  . Alcohol use: Yes    Comment: occ  No Known Allergies  Current Outpatient Medications  Medication Sig Dispense Refill  . iron polysaccharides (NIFEREX) 150 MG capsule Take 1 capsule (150 mg total) by mouth daily. 30 capsule 0  . metoprolol succinate (TOPROL-XL) 25 MG 24 hr tablet Take 1 tablet (25 mg total) by mouth daily. 30 tablet 0   No current facility-administered medications for this visit.    Review of Systems  Constitutional: Negative for chills, fatigue, fever and unexpected weight change.  HENT: Negative for trouble swallowing.  Eyes: Negative for loss of vision.  Respiratory: Negative for cough,  shortness of breath and wheezing.  Cardiovascular: Negative for chest pain, leg swelling, palpitations and syncope.  GI: Negative for abdominal pain, blood in stool, diarrhea, nausea and vomiting.  GU: Negative for difficulty urinating, dysuria, frequency and hematuria.  Musculoskeletal: Negative for back pain, leg pain and joint pain.  Skin: Negative for rash.  Neurological: Negative for dizziness, headaches, light-headedness, numbness and seizures.  Psychiatric: Negative for behavioral problem, confusion, depressed mood and sleep disturbance.        Objective:  Objective   Vitals:   03/05/21 1334  BP: (!) 150/80  Weight: 291 lb 12.8 oz (132.4 kg)  Height: 5\' 8"  (1.727 m)   Body mass index is 44.37 kg/m.  Physical Exam Vitals and nursing note reviewed. Exam conducted with a chaperone present.  Constitutional:      Appearance: Normal appearance. She is well-developed.  HENT:     Head: Normocephalic and atraumatic.  Eyes:     Extraocular Movements: Extraocular movements intact.     Pupils: Pupils are equal, round, and reactive to light.  Cardiovascular:     Rate and Rhythm: Normal rate and regular rhythm.  Pulmonary:     Effort: Pulmonary effort is normal. No respiratory distress.     Breath sounds: Normal breath sounds.  Abdominal:     General: Abdomen is flat.     Palpations: Abdomen is soft.  Genitourinary:    Comments: External: Normal appearing vulva. No lesions noted.  Speculum examination: Normal appearing cervix. Small to moderate  blood in the vaginal vault. No discharge. Bimanual examination: Uterus enlarged with irregular contour. No CMT. No adnexal masses. No adnexal tenderness. Pelvis not fixed.  Breast exam: breast equal without skin changes, nipple discharge, breast lump or enlarged lymph nodes Musculoskeletal:        General: No signs of injury.  Skin:    General: Skin is warm and dry.  Neurological:     Mental Status: She is alert and oriented to  person, place, and time.  Psychiatric:        Behavior: Behavior normal.        Thought Content: Thought content normal.        Judgment: Judgment normal.     Assessment/Plan:    55 y.o. G1P1001 with complaints of abnormal uterine bleeding secondary to suspected uterine fibroids.  Chronic blood loss with hemoglobin to 5.3 requiring multiple transfusions.  CHF symptoms with chronic anemia. Per cardiology: Patient's current NYHA Functional Heart Failure Classification is: II.  Patient will need a pelvic ultrasound to confirm the presence of uterine fibroids  Proactively we discussed options for management of uterine fibroids.  We discussed that this can include hormonal medications such as an oral contraceptive pill, progesterone only pill, Depo-Provera, Nexplanon,  progesterone IUD.  We discussed surgical options including hysteroscopy with myomectomy for small submucosal fibroids, abdominal or laparoscopic myomectomy, uterine fibroid embolization, and hysterectomy.  We discussed the pros  and cons of these different procedures and medical management options in detail.   We reviewed the options in detail and elected that Depo Provera would best suit her needs.  She will pick up this medication and return later this week for an injection visit with the nurse.  Pap smear performed today. Mammogram ordered-referral to [facility]   Hematology referral for anemia.  Patient will likely require IV iron transfusions.  We will plan to follow-up for the patient after her pelvic ultrasound in the next 1 to 2 months.  More than 45 minutes were spent face to face with the patient in the room, reviewing the medical record, labs and images, and coordinating care for the patient. The plan of management was discussed in detail and counseling was provided.   Adrian Prows MD [facility] 03/05/2021 1:42 PM

## 2021-03-05 NOTE — Patient Instructions (Signed)
Abnormal Uterine Bleeding  Abnormal uterine bleeding is unusual bleeding from the uterus. It includes bleeding after sex, or bleeding or spotting between menstrual periods. It may also include bleeding that is heavier than normal, menstrual periods that last longer than usual, or bleeding that occurs after menopause. Abnormal uterine bleeding can affect teenagers, women in their reproductive years, pregnant women, and women who have reached menopause. Common causes of abnormal uterine bleeding include:  Pregnancy.  Growths of tissue (polyps).  Benign tumors or growths in the uterus (fibroids). These are not cancer.  Infection.  Cancer.  Too much or too little of some hormones in the body (hormonal imbalances). Any type of abnormal bleeding should be checked by a health care provider. Many cases are minor and simple to treat, but others may be more serious. Treatment will depend on the cause and severity of the bleeding. Follow these instructions at home: Medicines  Take over-the-counter and prescription medicines only as told by your health care provider.  Tell your health care provider about other medicines that you take. You may be asked to stop taking aspirin or medicines that contain aspirin. These medicines can make bleeding worse.  If you were prescribed iron pills, take them as told by your health care provider. Iron pills help to replace iron that your body loses because of this condition. Managing constipation In cases of severe bleeding, you may be asked to increase your iron intake to treat anemia. This may cause constipation. To prevent or treat constipation, you may need to:  Drink enough fluid to keep your urine pale yellow.  Take over-the-counter or prescription medicines.  Eat foods that are high in fiber, such as beans, whole grains, and fresh fruits and vegetables.  Limit foods that are high in fat and processed sugars, such as fried or sweet foods. General  instructions  Monitor your condition for any changes.  Do not use tampons, douche, or have sex until your health care provider says these things are okay.  Change your pads often.  Get regular exams. This includes pelvic exams and cervical cancer screenings. ? It is up to you to get the results of any tests that are done. Ask your health care provider, or the department that is doing the tests, when your results will be ready.  Keep all follow-up visits as told by your health care provider. This is important. Contact a health care provider if you:  Have bleeding that lasts for more than 1 week.  Feel dizzy at times.  Feel nauseous or you vomit.  Feel light-headed or weak.  Notice any other changes that show that your condition is getting worse. Get help right away if you:  Pass out.  Have bleeding that soaks through a pad every hour.  Have pain in the abdomen.  Have a fever or chills.  Become sweaty or weak.  Pass large blood clots from your vagina. Summary  Abnormal uterine bleeding is unusual bleeding from the uterus.  Any type of abnormal bleeding should be evaluated by a health care provider. Many cases are minor and simple to treat, but others may be more serious.  Treatment will depend on the cause of the bleeding.  Get help right away if you pass out, you have bleeding that soaks through a pad every hour, or you pass large blood clots from your vagina. This information is not intended to replace advice given to you by your health care provider. Make sure you discuss any questions you  have with your health care provider. Document Revised: 05/22/2020 Document Reviewed: 07/18/2019 Elsevier Patient Education  2021 [place]. Uterine Fibroids  Uterine fibroids, also called leiomyomas, are noncancerous (benign) tumors that can grow in the uterus. They can cause heavy menstrual bleeding and pain. Fibroids may also grow in the fallopian tubes, cervix, or tissues  (ligaments) near the uterus. You may have one or many fibroids. Fibroids vary in size, weight, and where they grow in the uterus. Some can become quite large. Most fibroids do not require medical treatment. What are the causes? The cause of this condition is not known. What increases the risk? You are more likely to develop this condition if you:  Are in your 30s or 40s and have not gone through menopause.  Have a family history of this condition.  Are of African American descent.  Started your menstrual period at age 61 or younger.  Have never given birth.  Are overweight or obese. What are the signs or symptoms? Many women do not have any symptoms. Symptoms of this condition may include:  Heavy menstrual bleeding.  Bleeding between menstrual periods.  Pain and pressure in the pelvic area, between your hip bones.  Pain during sex.  Bladder problems, such as needing to urinate right away or more often than usual.  Inability to have children (infertility).  Failure to carry pregnancy to term (miscarriage). How is this diagnosed? This condition may be diagnosed based on:  Your symptoms and medical history.  A physical exam.  A pelvic exam that includes feeling for any tumors.  Imaging tests, such as ultrasound or MRI. How is this treated? Treatment for this condition may include follow-up visits with your health care provider to monitor your fibroids for any changes. Other treatment may include:  Medicines, such as: ? Medicines to relieve pain, including aspirin and NSAIDs, such as ibuprofen or naproxen. ? Hormone therapy. Treatment may be given as a pill or an injection, or it may be inserted into the uterus using an intrauterine device (IUD).  Surgery that would do one of the following: ? Remove the fibroids (myomectomy). This may be recommended if fibroids affect your fertility and you want to become pregnant. ? Remove the uterus (hysterectomy). ? Block the  blood supply to the fibroids (uterine artery embolization). This can cause them to shrink and die. Follow these instructions at home: Medicines  Take over-the-counter and prescription medicines only as told by your health care provider.  Ask your health care provider if you should take iron pills or eat more iron-rich foods, such as dark green, leafy vegetables. Heavy menstrual bleeding can cause low iron levels. Managing pain If directed, apply heat to your back or abdomen to reduce pain. Use the heat source that your health care provider recommends, such as a moist heat pack or a heating pad. To apply heat:  Place a towel between your skin and the heat source.  Leave the heat on for 20-30 minutes.  Remove the heat if your skin turns bright red. This is especially important if you are unable to feel pain, heat, or cold. You may have a greater risk of getting burned.   General instructions  Pay close attention to your menstrual cycle. Tell your health care provider about any changes, such as: ? Heavier bleeding that requires you to change your pads or tampons more than usual. ? A change in the number of days that your menstrual period lasts. ? A change in symptoms that come  with your menstrual period, such as back pain or cramps in your abdomen.  Keep all follow-up visits. This is important, especially if your fibroids need to be monitored for any changes. Contact a health care provider if you:  Have pelvic pain, back pain, or cramps in your abdomen that do not get better with medicine or heat.  Develop new bleeding between menstrual periods.  Have increased bleeding during or between menstrual periods.  Feel more tired or weak than usual.  Feel light-headed. Get help right away if you:  Faint.  Have pelvic pain that suddenly gets worse.  Have severe vaginal bleeding that soaks a tampon or pad in 30 minutes or less. Summary  Uterine fibroids are noncancerous (benign) tumors  that can develop in the uterus.  The exact cause of this condition is not known.  Most fibroids do not require medical treatment unless they affect your ability to have children (fertility).  Contact a health care provider if you have pelvic pain, back pain, or cramps in your abdomen that do not get better with medicines.  Get help right away if you faint, have pelvic pain that suddenly gets worse, or have severe vaginal bleeding. This information is not intended to replace advice given to you by your health care provider. Make sure you discuss any questions you have with your health care provider. Document Revised: 04/16/2020 Document Reviewed: 04/16/2020 Elsevier Patient Education  [place].

## 2021-03-06 ENCOUNTER — Other Ambulatory Visit: Payer: Self-pay

## 2021-03-07 ENCOUNTER — Ambulatory Visit: Payer: Self-pay

## 2021-03-10 ENCOUNTER — Ambulatory Visit (INDEPENDENT_AMBULATORY_CARE_PROVIDER_SITE_OTHER): Payer: Self-pay

## 2021-03-10 ENCOUNTER — Other Ambulatory Visit: Payer: Self-pay

## 2021-03-10 ENCOUNTER — Other Ambulatory Visit: Payer: Self-pay | Admitting: Obstetrics and Gynecology

## 2021-03-10 ENCOUNTER — Ambulatory Visit: Payer: Self-pay

## 2021-03-10 DIAGNOSIS — A5909 Other urogenital trichomoniasis: Secondary | ICD-10-CM

## 2021-03-10 DIAGNOSIS — N939 Abnormal uterine and vaginal bleeding, unspecified: Secondary | ICD-10-CM

## 2021-03-10 LAB — CYTOLOGY - PAP
Chlamydia: NEGATIVE
Comment: NEGATIVE
Comment: NEGATIVE
Comment: NEGATIVE
Comment: NORMAL
Diagnosis: NEGATIVE
High risk HPV: NEGATIVE
Neisseria Gonorrhea: NEGATIVE
Trichomonas: POSITIVE — AB

## 2021-03-10 LAB — POCT URINE PREGNANCY: Preg Test, Ur: NEGATIVE

## 2021-03-10 MED ORDER — METRONIDAZOLE 500 MG PO TABS
ORAL_TABLET | ORAL | 0 refills | Status: DC
  Filled 2021-03-10: qty 4, 1d supply, fill #0

## 2021-03-10 MED ORDER — MEDROXYPROGESTERONE ACETATE 150 MG/ML IM SUSP
150.0000 mg | Freq: Once | INTRAMUSCULAR | Status: AC
  Administered 2021-03-10: 150 mg via INTRAMUSCULAR

## 2021-03-10 NOTE — Progress Notes (Signed)
Pt here for depo which was given IM right glut after neg urine preg test results.  NDC# 8381-8403-75

## 2021-03-11 ENCOUNTER — Other Ambulatory Visit: Payer: Self-pay

## 2021-03-13 ENCOUNTER — Inpatient Hospital Stay: Payer: Self-pay | Attending: Nurse Practitioner | Admitting: Nurse Practitioner

## 2021-03-13 ENCOUNTER — Inpatient Hospital Stay: Payer: Self-pay

## 2021-03-13 ENCOUNTER — Encounter: Payer: Self-pay | Admitting: Nurse Practitioner

## 2021-03-13 DIAGNOSIS — I509 Heart failure, unspecified: Secondary | ICD-10-CM | POA: Insufficient documentation

## 2021-03-13 DIAGNOSIS — N920 Excessive and frequent menstruation with regular cycle: Secondary | ICD-10-CM | POA: Insufficient documentation

## 2021-03-13 DIAGNOSIS — I429 Cardiomyopathy, unspecified: Secondary | ICD-10-CM | POA: Insufficient documentation

## 2021-03-13 DIAGNOSIS — D649 Anemia, unspecified: Secondary | ICD-10-CM

## 2021-03-13 DIAGNOSIS — D500 Iron deficiency anemia secondary to blood loss (chronic): Secondary | ICD-10-CM | POA: Insufficient documentation

## 2021-03-13 DIAGNOSIS — Z5989 Other problems related to housing and economic circumstances: Secondary | ICD-10-CM | POA: Insufficient documentation

## 2021-03-13 DIAGNOSIS — Z5900 Homelessness unspecified: Secondary | ICD-10-CM | POA: Insufficient documentation

## 2021-03-13 LAB — CBC WITH DIFFERENTIAL/PLATELET
Abs Immature Granulocytes: 0.01 10*3/uL (ref 0.00–0.07)
Basophils Absolute: 0.1 10*3/uL (ref 0.0–0.1)
Basophils Relative: 1 %
Eosinophils Absolute: 0.1 10*3/uL (ref 0.0–0.5)
Eosinophils Relative: 1 %
HCT: 28.8 % — ABNORMAL LOW (ref 36.0–46.0)
Hemoglobin: 8.8 g/dL — ABNORMAL LOW (ref 12.0–15.0)
Immature Granulocytes: 0 %
Lymphocytes Relative: 30 %
Lymphs Abs: 1.9 10*3/uL (ref 0.7–4.0)
MCH: 22.4 pg — ABNORMAL LOW (ref 26.0–34.0)
MCHC: 30.6 g/dL (ref 30.0–36.0)
MCV: 73.3 fL — ABNORMAL LOW (ref 80.0–100.0)
Monocytes Absolute: 0.4 10*3/uL (ref 0.1–1.0)
Monocytes Relative: 6 %
Neutro Abs: 3.9 10*3/uL (ref 1.7–7.7)
Neutrophils Relative %: 62 %
Platelets: 185 10*3/uL (ref 150–400)
RBC: 3.93 MIL/uL (ref 3.87–5.11)
RDW: 25.1 % — ABNORMAL HIGH (ref 11.5–15.5)
WBC: 6.4 10*3/uL (ref 4.0–10.5)
nRBC: 0 % (ref 0.0–0.2)

## 2021-03-13 LAB — COMPREHENSIVE METABOLIC PANEL
ALT: 11 U/L (ref 0–44)
AST: 15 U/L (ref 15–41)
Albumin: 3.9 g/dL (ref 3.5–5.0)
Alkaline Phosphatase: 33 U/L — ABNORMAL LOW (ref 38–126)
Anion gap: 6 (ref 5–15)
BUN: 15 mg/dL (ref 6–20)
CO2: 25 mmol/L (ref 22–32)
Calcium: 8.9 mg/dL (ref 8.9–10.3)
Chloride: 105 mmol/L (ref 98–111)
Creatinine, Ser: 0.75 mg/dL (ref 0.44–1.00)
GFR, Estimated: >60 mL/min (ref >60)
Glucose, Bld: 82 mg/dL (ref 70–99)
Potassium: 4 mmol/L (ref 3.5–5.1)
Sodium: 136 mmol/L (ref 135–145)
Total Bilirubin: 0.9 mg/dL (ref 0.3–1.2)
Total Protein: 7.3 g/dL (ref 6.5–8.1)

## 2021-03-13 LAB — SEDIMENTATION RATE: Sed Rate: 31 mm/hr — ABNORMAL HIGH (ref 0–30)

## 2021-03-13 LAB — IRON AND TIBC
Iron: 80 ug/dL (ref 28–170)
Saturation Ratios: 20 % (ref 10.4–31.8)
TIBC: 407 ug/dL (ref 250–450)
UIBC: 327 ug/dL

## 2021-03-13 LAB — DAT, POLYSPECIFIC AHG (ARMC ONLY): Polyspecific AHG test: NEGATIVE

## 2021-03-13 LAB — RETICULOCYTES
Immature Retic Fract: 25.3 % — ABNORMAL HIGH (ref 2.3–15.9)
RBC.: 3.94 MIL/uL (ref 3.87–5.11)
Retic Count, Absolute: 21.7 10*3/uL (ref 19.0–186.0)
Retic Ct Pct: 0.6 % (ref 0.4–3.1)

## 2021-03-13 LAB — LACTATE DEHYDROGENASE: LDH: 127 U/L (ref 98–192)

## 2021-03-13 LAB — FOLATE: Folate: 12.2 ng/mL (ref >5.9)

## 2021-03-13 LAB — VITAMIN B12: Vitamin B-12: 353 pg/mL (ref 180–914)

## 2021-03-13 LAB — FERRITIN: Ferritin: 9 ng/mL — ABNORMAL LOW (ref 11–307)

## 2021-03-13 NOTE — Progress Notes (Signed)
[facility] [address], [place], [place] [address] [phone] (phone) [phone] (fax)  Clinic Day:  03/13/2021  Referring physician: Homero Fellers, *   CHIEF COMPLAINT:  CC: Anemia  HISTORY OF PRESENTING ILLNESS:  Lauren Hensley is a 55 y.o. female with a history of CHF, cardiomyopathy, symptomatic anemia, cocaine use, who is referred in consultation with Dr Gilman Schmidt for assessment and management of anemia.  Patient was previously hospitalized with severe anemia with hemoglobin of 5.3.  She received transfusion and symptoms improved.  She underwent evaluation by GI and underwent endoscopy, colonoscopy, and capsule studies. Gastric biopsies confirm presence of h. Pylori infection. She was treated with omperazole, clarithromycin, and amoxicillin.  Patient reported having heavy menstrual cycles with passage of large blood clots.  Has not been followed by gynecology until recently.  She has been staying with family recently.  Does not have a telephone. Does not have insurance. Is worried about cost of treatment and transportation to appointments. She denies abnormal bleeding or bruising. Denies hemoptysis.  Denies melena or hemato.chezia. Endorses pica. Heavy menstrual periods.   REVIEW OF SYSTEMS:  Review of Systems  Constitutional:  Positive for fatigue. Negative for appetite change and unexpected weight change.  HENT:   Negative for mouth sores, sore throat and trouble swallowing.   Respiratory:  Negative for chest tightness and shortness of breath.   Cardiovascular:  Negative for leg swelling.  Gastrointestinal:  Negative for abdominal distention, abdominal pain, blood in stool, constipation, diarrhea, nausea, rectal pain and vomiting.  Endocrine: Negative for hot flashes.  Genitourinary:  Positive for menstrual problem. Negative for bladder incontinence, dysuria, pelvic pain, vaginal bleeding and vaginal discharge.    Musculoskeletal:  Negative for flank pain and neck stiffness.  Skin:  Negative for itching, rash and wound.  Neurological:  Negative for dizziness, headaches, light-headedness and numbness.  Psychiatric/Behavioral:  Negative for confusion, depression and sleep disturbance. The patient is not nervous/anxious.     VITALS:  Last menstrual period 03/03/2021.  Wt Readings from Last 3 Encounters:  03/05/21 291 lb 12.8 oz (132.4 kg)  02/01/21 230 lb (104.3 kg)    There is no height or weight on file to calculate BMI.  Performance status (ECOG): 1 - Symptomatic but completely ambulatory  PHYSICAL EXAM:  Physical Exam Nursing note reviewed.  Constitutional:      Appearance: She is not ill-appearing.  HENT:     Head: Normocephalic.  Eyes:     General: No scleral icterus.    Conjunctiva/sclera: Conjunctivae normal.  Cardiovascular:     Rate and Rhythm: Normal rate and regular rhythm.     Pulses: Normal pulses.  Pulmonary:     Effort: Pulmonary effort is normal. No respiratory distress.     Breath sounds: Normal breath sounds.  Abdominal:     General: There is no distension.     Tenderness: There is no abdominal tenderness. There is no guarding.  Musculoskeletal:        General: No swelling or deformity.     Cervical back: Neck supple.  Lymphadenopathy:     Cervical: No cervical adenopathy.  Skin:    General: Skin is warm and dry.     Coloration: Skin is not pale.     Findings: No rash.  Neurological:     Mental Status: She is alert and oriented to person, place, and time.  Psychiatric:        Behavior: Behavior normal.  Thought Content: Thought content normal.        Judgment: Judgment normal.    LABS:   CBC Latest Ref Rng & Units 02/10/2021 02/09/2021 02/07/2021  WBC 4.0 - 10.5 K/uL 5.8 5.2 5.2  Hemoglobin 12.0 - 15.0 g/dL 9.0(L) 8.3(L) 8.1(L)  Hematocrit 36.0 - 46.0 % 30.2(L) 27.7(L) 27.1(L)  Platelets 150 - 400 K/uL 521(H) 475(H) 398   CMP Latest Ref Rng &  Units 02/10/2021 02/07/2021 02/06/2021  Glucose 70 - 99 mg/dL 90 86 82  BUN 6 - 20 mg/dL 7 7 6   Creatinine 0.44 - 1.00 mg/dL 0.74 0.72 0.74  Sodium 135 - 145 mmol/L 139 140 140  Potassium 3.5 - 5.1 mmol/L 4.2 4.1 3.9  Chloride 98 - 111 mmol/L 108 108 109  CO2 22 - 32 mmol/L 25 25 24   Calcium 8.9 - 10.3 mg/dL 9.2 9.1 8.9  Total Protein 6.5 - 8.1 g/dL - - -  Total Bilirubin 0.3 - 1.2 mg/dL - - -  Alkaline Phos 38 - 126 U/L - - -  AST 15 - 41 U/L - - -  ALT 0 - 44 U/L - - -     No results found for: CEA1 / No results found for: CEA1 No results found for: PSA1 No results found for: XIP382 No results found for: NKN397  No results found for: TOTALPROTELP, ALBUMINELP, A1GS, A2GS, BETS, BETA2SER, GAMS, MSPIKE, SPEI Lab Results  Component Value Date   TIBC 508 (H) 02/01/2021   FERRITIN 2 (L) 02/01/2021   IRONPCTSAT 4 (L) 02/01/2021   No results found for: LDH  STUDIES:  No results found.    HISTORY:   Past Medical History:  Diagnosis Date   Anemia    Hypertension     Past Surgical History:  Procedure Laterality Date   COLONOSCOPY WITH PROPOFOL N/A 02/08/2021   Procedure: COLONOSCOPY WITH PROPOFOL;  Surgeon: Lin Landsman, MD;  Location: [facility] ENDOSCOPY;  Service: Gastroenterology;  Laterality: N/A;   ESOPHAGOGASTRODUODENOSCOPY N/A 02/08/2021   Procedure: ESOPHAGOGASTRODUODENOSCOPY (EGD);  Surgeon: Lin Landsman, MD;  Location: [facility] ENDOSCOPY;  Service: Gastroenterology;  Laterality: N/A;   GIVENS CAPSULE STUDY N/A 02/09/2021   Procedure: GIVENS CAPSULE STUDY;  Surgeon: Lesly Rubenstein, MD;  Location: [facility] ENDOSCOPY;  Service: Endoscopy;  Laterality: N/A;    Family History  Problem Relation Age of Onset   Diabetes Mother    COPD Mother    Heart Problems Mother    Emphysema Father    Hypercholesterolemia Brother    Diabetes Maternal Aunt    Obesity Maternal Aunt    Arthritis Maternal Aunt     Social History:  reports that she has never smoked. She has  never used smokeless tobacco. She reports current alcohol use. She reports previous drug use. Drugs: Marijuana and Cocaine.The patient is alone  today.  Allergies: No Known Allergies  Current Medications: Current Outpatient Medications  Medication Sig Dispense Refill   medroxyPROGESTERone (DEPO-PROVERA) 150 MG/ML injection Inject 1 mL (150 mg total) into the muscle every 3 (three) months. 1 mL 0   metroNIDAZOLE (FLAGYL) 500 MG tablet Take 2 tablets (1000mg  total) by mouth twice a day for one day.  (Or you can take all 4 tablets at once for one dose if you can tolerate it). 4 tablet 0   No current facility-administered medications for this visit.     ASSESSMENT & PLAN:   Assessment:  Lauren Hensley is a 55 y.o. female who presents as new patient in consultation  from Dr. Gilman Schmidt for anemia.   Iron Deficiency Anemia- likely related to menorrhagia and previous h pylori infection. Will check anemia labs today to rule out other etiologies as well. She has received depo from gyn which has slowed menses. She has had egd, capsule, and colonoscopy with gi. Labs today. Pending at time of dictation but consistent with iron deficiency likely related to chronic blood loss. Ferritin 9, saturation ratio 20%, Plan for venofer 200 mg weekly x 5.   Symptomatic anemia- hemoglobin 8.8, microcytic. Goal hemoglobin > 8. If hemoglobin < 8, plan for transfusion.   Beta thalassemia minor- Hgb A decreased, Hgb A2 elevated. Findings suspicious for beta thalassemia minor. Will correct iron deficiency and re-evaluate if symptomatic or microcytosis persist. No evidence of hemolysis on today's labs results. Plan to correct iron deficiency and have patient follow up with MD to evaluate and determine if additional work up is recommended.   Social determinants of health- patient is homeless, currently living with family, lacks dependable transportation, and does not have health insurance. She is working with SS to get new ID so  that she may get medicaid. We will assist her today with setting up transportation to and from her appointments. Encouraged her to work with financial services to help with medical bills.   Plan: Labs today   Venofer x 5 RTC in 8 weeks - labs (cbc, ferritin, iron studies), MD  I discussed the assessment and treatment plan with the patient.  The patient was provided an opportunity to ask questions and all were answered.  The patient agreed with the plan and demonstrated an understanding of the instructions.  The patient was advised to call back if the symptoms worsen or if the condition fails to improve as anticipated.  I provided 35 minutes of face-to-face time during this this encounter and > 50% was spent counseling as documented under my assessment and plan.    Verlon Au, NP [place]

## 2021-03-14 ENCOUNTER — Other Ambulatory Visit: Payer: Self-pay

## 2021-03-14 LAB — ANA W/REFLEX: Anti Nuclear Antibody (ANA): NEGATIVE

## 2021-03-14 LAB — HAPTOGLOBIN: Haptoglobin: 110 mg/dL (ref 33–346)

## 2021-03-14 LAB — ERYTHROPOIETIN: Erythropoietin: 173.2 m[IU]/mL — ABNORMAL HIGH (ref 2.6–18.5)

## 2021-03-15 LAB — PROTEIN ELECTROPHORESIS, SERUM
A/G Ratio: 1.1 (ref 0.7–1.7)
Albumin ELP: 3.6 g/dL (ref 2.9–4.4)
Alpha-1-Globulin: 0.2 g/dL (ref 0.0–0.4)
Alpha-2-Globulin: 0.5 g/dL (ref 0.4–1.0)
Beta Globulin: 1 g/dL (ref 0.7–1.3)
Gamma Globulin: 1.4 g/dL (ref 0.4–1.8)
Globulin, Total: 3.2 g/dL (ref 2.2–3.9)
Total Protein ELP: 6.8 g/dL (ref 6.0–8.5)

## 2021-03-17 ENCOUNTER — Other Ambulatory Visit: Payer: Self-pay

## 2021-03-17 ENCOUNTER — Other Ambulatory Visit: Payer: Self-pay | Admitting: Internal Medicine

## 2021-03-17 LAB — HGB FRACTIONATION CASCADE
Hgb A2: 4.3 % — ABNORMAL HIGH (ref 1.8–3.2)
Hgb A: 95.3 % — ABNORMAL LOW (ref 96.4–98.8)
Hgb F: 0.4 % (ref 0.0–2.0)
Hgb S: 0 %

## 2021-03-20 ENCOUNTER — Other Ambulatory Visit: Payer: Self-pay

## 2021-03-20 ENCOUNTER — Other Ambulatory Visit: Payer: Self-pay | Admitting: Nurse Practitioner

## 2021-03-20 ENCOUNTER — Inpatient Hospital Stay: Payer: Self-pay

## 2021-03-20 VITALS — BP 119/64 | HR 59 | Temp 96.3°F | Resp 18

## 2021-03-20 DIAGNOSIS — D509 Iron deficiency anemia, unspecified: Secondary | ICD-10-CM

## 2021-03-20 DIAGNOSIS — D500 Iron deficiency anemia secondary to blood loss (chronic): Secondary | ICD-10-CM | POA: Insufficient documentation

## 2021-03-20 MED ORDER — IRON SUCROSE 20 MG/ML IV SOLN
200.0000 mg | Freq: Once | INTRAVENOUS | Status: AC
  Administered 2021-03-20: 200 mg via INTRAVENOUS

## 2021-03-20 MED ORDER — SODIUM CHLORIDE 0.9 % IV SOLN
Freq: Once | INTRAVENOUS | Status: AC
  Filled 2021-03-20: qty 250

## 2021-03-20 MED ORDER — IRON SUCROSE 20 MG/ML IV SOLN
200.0000 mg | Freq: Once | INTRAVENOUS | Status: DC
  Filled 2021-03-20: qty 10

## 2021-03-20 MED ORDER — SODIUM CHLORIDE 0.9 % IV SOLN: 200.0000 mg | Freq: Once | INTRAVENOUS | Status: DC

## 2021-03-20 NOTE — Patient Instructions (Signed)
[facility] [place] [facility]  Discharge Instructions: Thank you for choosing [facility] to provide your oncology and hematology care.  If you have a lab appointment with the Cancer Center, please go directly to the Cancer Center and check in at the registration area.  Wear comfortable clothing and clothing appropriate for easy access to any Portacath or PICC line.   We strive to give you quality time with your provider. You may need to reschedule your appointment if you arrive late (15 or more minutes).  Arriving late affects you and other patients whose appointments are after yours.  Also, if you miss three or more appointments without notifying the office, you may be dismissed from the clinic at the provider's discretion.      For prescription refill requests, have your pharmacy contact our office and allow 72 hours for refills to be completed.    Today you received Venofer   To help prevent nausea and vomiting after your treatment, we encourage you to take your nausea medication as directed.  BELOW ARE SYMPTOMS THAT SHOULD BE REPORTED IMMEDIATELY: . *FEVER GREATER THAN 100.4 F (38 C) OR HIGHER . *CHILLS OR SWEATING . *NAUSEA AND VOMITING THAT IS NOT CONTROLLED WITH YOUR NAUSEA MEDICATION . *UNUSUAL SHORTNESS OF BREATH . *UNUSUAL BRUISING OR BLEEDING . *URINARY PROBLEMS (pain or burning when urinating, or frequent urination) . *BOWEL PROBLEMS (unusual diarrhea, constipation, pain near the anus) . TENDERNESS IN MOUTH AND THROAT WITH OR WITHOUT PRESENCE OF ULCERS (sore throat, sores in mouth, or a toothache) . UNUSUAL RASH, SWELLING OR PAIN  . UNUSUAL VAGINAL DISCHARGE OR ITCHING   Items with * indicate a potential emergency and should be followed up as soon as possible or go to the Emergency Department if any problems should occur.  Please show the CHEMOTHERAPY ALERT CARD or IMMUNOTHERAPY ALERT CARD at check-in to the Emergency Department and triage  nurse.  Should you have questions after your visit or need to cancel or reschedule your appointment, please contact [facility] [place] [facility]  [phone] and follow the prompts.  Office hours are 8:00 a.m. to 4:30 p.m. Monday - Friday. Please note that voicemails left after 4:00 p.m. may not be returned until the following business day.  We are closed weekends and major holidays. You have access to a nurse at all times for urgent questions. Please call the main number to the clinic [phone] and follow the prompts.  For any non-urgent questions, you may also contact your provider using MyChart. We now offer e-Visits for anyone 18 and older to request care online for non-urgent symptoms. For details visit [email or web address].   Also download the MyChart app! Go to the app store, search "MyChart", open the app, select [facility], and log in with your MyChart username and password.  Due to Covid, a mask is required upon entering the hospital/clinic. If you do not have a mask, one will be given to you upon arrival. For doctor visits, patients may have 1 support person aged 18 or older with them. For treatment visits, patients cannot have anyone with them due to current Covid guidelines and our immunocompromised population.  

## 2021-03-27 ENCOUNTER — Inpatient Hospital Stay: Payer: Self-pay

## 2021-03-27 VITALS — BP 138/81 | HR 50 | Temp 96.9°F | Resp 16

## 2021-03-27 DIAGNOSIS — D509 Iron deficiency anemia, unspecified: Secondary | ICD-10-CM

## 2021-03-27 MED ORDER — IRON SUCROSE 20 MG/ML IV SOLN
200.0000 mg | Freq: Once | INTRAVENOUS | Status: AC
Start: 2021-03-27 — End: 2021-03-27
  Administered 2021-03-27: 200 mg via INTRAVENOUS
  Filled 2021-03-27: qty 10

## 2021-03-27 MED ORDER — SODIUM CHLORIDE 0.9 % IV SOLN
Freq: Once | INTRAVENOUS | Status: AC
  Filled 2021-03-27: qty 250

## 2021-03-27 MED ORDER — SODIUM CHLORIDE 0.9 % IV SOLN: 200.0000 mg | Freq: Once | INTRAVENOUS | Status: DC

## 2021-03-27 NOTE — Patient Instructions (Signed)

## 2021-04-03 ENCOUNTER — Inpatient Hospital Stay: Payer: Self-pay | Attending: Nurse Practitioner

## 2021-04-03 VITALS — BP 126/67 | HR 61 | Temp 96.4°F | Resp 18

## 2021-04-03 DIAGNOSIS — N920 Excessive and frequent menstruation with regular cycle: Secondary | ICD-10-CM | POA: Insufficient documentation

## 2021-04-03 DIAGNOSIS — D509 Iron deficiency anemia, unspecified: Secondary | ICD-10-CM

## 2021-04-03 DIAGNOSIS — Z5989 Other problems related to housing and economic circumstances: Secondary | ICD-10-CM | POA: Insufficient documentation

## 2021-04-03 DIAGNOSIS — I429 Cardiomyopathy, unspecified: Secondary | ICD-10-CM | POA: Insufficient documentation

## 2021-04-03 DIAGNOSIS — Z5900 Homelessness unspecified: Secondary | ICD-10-CM | POA: Insufficient documentation

## 2021-04-03 DIAGNOSIS — I509 Heart failure, unspecified: Secondary | ICD-10-CM | POA: Insufficient documentation

## 2021-04-03 DIAGNOSIS — D500 Iron deficiency anemia secondary to blood loss (chronic): Secondary | ICD-10-CM | POA: Insufficient documentation

## 2021-04-03 MED ORDER — SODIUM CHLORIDE 0.9 % IV SOLN: 200.0000 mg | Freq: Once | INTRAVENOUS | Status: DC

## 2021-04-03 MED ORDER — SODIUM CHLORIDE 0.9 % IV SOLN
Freq: Once | INTRAVENOUS | Status: AC
  Filled 2021-04-03: qty 250

## 2021-04-03 MED ORDER — IRON SUCROSE 20 MG/ML IV SOLN
200.0000 mg | Freq: Once | INTRAVENOUS | Status: AC
Start: 2021-04-03 — End: 2021-04-03
  Administered 2021-04-03: 200 mg via INTRAVENOUS
  Filled 2021-04-03: qty 10

## 2021-04-07 ENCOUNTER — Ambulatory Visit: Payer: Self-pay

## 2021-04-08 ENCOUNTER — Ambulatory Visit: Payer: Self-pay | Admitting: Obstetrics and Gynecology

## 2021-04-10 ENCOUNTER — Inpatient Hospital Stay: Payer: Self-pay

## 2021-04-10 VITALS — BP 133/76 | HR 51 | Temp 96.0°F | Resp 18

## 2021-04-10 DIAGNOSIS — D509 Iron deficiency anemia, unspecified: Secondary | ICD-10-CM

## 2021-04-10 MED ORDER — SODIUM CHLORIDE 0.9 % IV SOLN: 200.0000 mg | Freq: Once | INTRAVENOUS | Status: DC

## 2021-04-10 MED ORDER — SODIUM CHLORIDE 0.9 % IV SOLN
Freq: Once | INTRAVENOUS | Status: AC
  Filled 2021-04-10: qty 250

## 2021-04-10 MED ORDER — IRON SUCROSE 20 MG/ML IV SOLN
200.0000 mg | Freq: Once | INTRAVENOUS | Status: AC
  Administered 2021-04-10: 200 mg via INTRAVENOUS
  Filled 2021-04-10: qty 10

## 2021-04-10 NOTE — Patient Instructions (Signed)

## 2021-04-16 ENCOUNTER — Ambulatory Visit: Payer: Self-pay

## 2021-04-17 ENCOUNTER — Inpatient Hospital Stay: Payer: Self-pay

## 2021-04-17 VITALS — BP 139/75 | HR 62 | Temp 97.2°F | Resp 18

## 2021-04-17 DIAGNOSIS — D509 Iron deficiency anemia, unspecified: Secondary | ICD-10-CM

## 2021-04-17 MED ORDER — SODIUM CHLORIDE 0.9 % IV SOLN: 200.0000 mg | Freq: Once | INTRAVENOUS | Status: DC

## 2021-04-17 MED ORDER — SODIUM CHLORIDE 0.9 % IV SOLN
Freq: Once | INTRAVENOUS | Status: AC
  Filled 2021-04-17: qty 250

## 2021-04-17 MED ORDER — IRON SUCROSE 20 MG/ML IV SOLN
200.0000 mg | Freq: Once | INTRAVENOUS | Status: AC
  Administered 2021-04-17: 200 mg via INTRAVENOUS
  Filled 2021-04-17: qty 10

## 2021-04-17 NOTE — Patient Instructions (Signed)
[facility] [place] [facility]  Discharge Instructions: Thank you for choosing [facility] to provide your oncology and hematology care.  If you have a lab appointment with the Cancer Center, please go directly to the Cancer Center and check in at the registration area.  Wear comfortable clothing and clothing appropriate for easy access to any Portacath or PICC line.   We strive to give you quality time with your provider. You may need to reschedule your appointment if you arrive late (15 or more minutes).  Arriving late affects you and other patients whose appointments are after yours.  Also, if you miss three or more appointments without notifying the office, you may be dismissed from the clinic at the provider's discretion.      For prescription refill requests, have your pharmacy contact our office and allow 72 hours for refills to be completed.    Today you received the following chemotherapy and/or immunotherapy agents venofer       To help prevent nausea and vomiting after your treatment, we encourage you to take your nausea medication as directed.  BELOW ARE SYMPTOMS THAT SHOULD BE REPORTED IMMEDIATELY: *FEVER GREATER THAN 100.4 F (38 C) OR HIGHER *CHILLS OR SWEATING *NAUSEA AND VOMITING THAT IS NOT CONTROLLED WITH YOUR NAUSEA MEDICATION *UNUSUAL SHORTNESS OF BREATH *UNUSUAL BRUISING OR BLEEDING *URINARY PROBLEMS (pain or burning when urinating, or frequent urination) *BOWEL PROBLEMS (unusual diarrhea, constipation, pain near the anus) TENDERNESS IN MOUTH AND THROAT WITH OR WITHOUT PRESENCE OF ULCERS (sore throat, sores in mouth, or a toothache) UNUSUAL RASH, SWELLING OR PAIN  UNUSUAL VAGINAL DISCHARGE OR ITCHING   Items with * indicate a potential emergency and should be followed up as soon as possible or go to the Emergency Department if any problems should occur.  Please show the CHEMOTHERAPY ALERT CARD or IMMUNOTHERAPY ALERT CARD at check-in  to the Emergency Department and triage nurse.  Should you have questions after your visit or need to cancel or reschedule your appointment, please contact [facility] [place] [facility]  [phone] and follow the prompts.  Office hours are 8:00 a.m. to 4:30 p.m. Monday - Friday. Please note that voicemails left after 4:00 p.m. may not be returned until the following business day.  We are closed weekends and major holidays. You have access to a nurse at all times for urgent questions. Please call the main number to the clinic [phone] and follow the prompts.  For any non-urgent questions, you may also contact your provider using MyChart. We now offer e-Visits for anyone 18 and older to request care online for non-urgent symptoms. For details visit [email or web address].   Also download the MyChart app! Go to the app store, search "MyChart", open the app, select , and log in with your MyChart username and password.  Due to Covid, a mask is required upon entering the hospital/clinic. If you do not have a mask, one will be given to you upon arrival. For doctor visits, patients may have 1 support person aged 18 or older with them. For treatment visits, patients cannot have anyone with them due to current Covid guidelines and our immunocompromised population.   Iron Sucrose injection What is this medication? IRON SUCROSE (AHY ern SOO krohs) is an iron complex. Iron is used to make healthy red blood cells, which carry oxygen and nutrients throughout the body. This medicine is used to treat iron deficiency anemia in people with chronickidney disease. This medicine may be used for   other purposes; ask your health care provider orpharmacist if you have questions. COMMON BRAND NAME(S): Venofer What should I tell my care team before I take this medication? They need to know if you have any of these conditions: anemia not caused by low iron levels heart disease high levels of  iron in the blood kidney disease liver disease an unusual or allergic reaction to iron, other medicines, foods, dyes, or preservatives pregnant or trying to get pregnant breast-feeding How should I use this medication? This medicine is for infusion into a vein. It is given by a health careprofessional in a hospital or clinic setting. Talk to your pediatrician regarding the use of this medicine in children. While this drug may be prescribed for children as young as 2 years for selectedconditions, precautions do apply. Overdosage: If you think you have taken too much of this medicine contact apoison control center or emergency room at once. NOTE: This medicine is only for you. Do not share this medicine with others. What if I miss a dose? It is important not to miss your dose. Call your doctor or health careprofessional if you are unable to keep an appointment. What may interact with this medication? Do not take this medicine with any of the following medications: deferoxamine dimercaprol other iron products This medicine may also interact with the following medications: chloramphenicol deferasirox This list may not describe all possible interactions. Give your health care provider a list of all the medicines, herbs, non-prescription drugs, or dietary supplements you use. Also tell them if you smoke, drink alcohol, or use illegaldrugs. Some items may interact with your medicine. What should I watch for while using this medication? Visit your doctor or healthcare professional regularly. Tell your doctor or healthcare professional if your symptoms do not start to get better or if theyget worse. You may need blood work done while you are taking this medicine. You may need to follow a special diet. Talk to your doctor. Foods that contain iron include: whole grains/cereals, dried fruits, beans, or peas, leafy greenvegetables, and organ meats (liver, kidney). What side effects may I notice from  receiving this medication? Side effects that you should report to your doctor or health care professionalas soon as possible: allergic reactions like skin rash, itching or hives, swelling of the face, lips, or tongue breathing problems changes in blood pressure cough fast, irregular heartbeat feeling faint or lightheaded, falls fever or chills flushing, sweating, or hot feelings joint or muscle aches/pains seizures swelling of the ankles or feet unusually weak or tired Side effects that usually do not require medical attention (report to yourdoctor or health care professional if they continue or are bothersome): diarrhea feeling achy headache irritation at site where injected nausea, vomiting stomach upset tiredness This list may not describe all possible side effects. Call your doctor for medical advice about side effects. You may report side effects to FDA at1-800-FDA-1088. Where should I keep my medication? This drug is given in a hospital or clinic and will not be stored at home. NOTE: This sheet is a summary. It may not cover all possible information. If you have questions about this medicine, talk to your doctor, pharmacist, orhealth care provider.  2022 Elsevier/Gold Standard (2011-06-25 17:14:35)  

## 2021-04-22 ENCOUNTER — Other Ambulatory Visit: Payer: Self-pay

## 2021-04-22 ENCOUNTER — Ambulatory Visit: Payer: Self-pay | Attending: Oncology

## 2021-04-22 ENCOUNTER — Ambulatory Visit
Admission: RE | Admit: 2021-04-22 | Discharge: 2021-04-22 | Disposition: A | Discharge status: Home / Self Care | Payer: Self-pay | Source: Ambulatory Visit | Attending: Oncology | Admitting: Oncology

## 2021-04-22 VITALS — BP 143/73 | HR 50 | Temp 96.9°F | Ht 68.5 in | Wt 284.0 lb

## 2021-04-22 DIAGNOSIS — Z0000 Encounter for general adult medical examination without abnormal findings: Secondary | ICD-10-CM | POA: Insufficient documentation

## 2021-04-22 NOTE — Progress Notes (Signed)
  Subjective:     Patient ID: Lauren Hensley, female   DOB: [birth year], 55 y.o.   MRN: [id number]  HPI   Review of Systems     Objective:   Physical Exam Chest:  Breasts:    Right: No swelling, bleeding, inverted nipple, mass, nipple discharge, skin change or tenderness.     Left: No swelling, bleeding, inverted nipple, mass, nipple discharge, skin change or tenderness.       Assessment:     55 year old patient presents for [facility]CP clinic visit.  Patient screened, and meets BCCCP eligibility.  Patient does not have insurance, Medicare or Medicaid.  Instructed patient on breast self awareness using teach back method.   Clinical breast exam unremarkable.  Risk Assessment     Risk Scores       04/22/2021   Last edited by: Rico Junker, RN   5-year risk: 1 %   Lifetime risk: 6.5 %               Plan:     Sent for baseline bilateral screening mammogram via courtesy car.

## 2021-04-25 ENCOUNTER — Other Ambulatory Visit: Payer: Self-pay

## 2021-04-25 ENCOUNTER — Ambulatory Visit
Admission: RE | Admit: 2021-04-25 | Discharge: 2021-04-25 | Disposition: A | Discharge status: Home / Self Care | Payer: Self-pay | Source: Ambulatory Visit | Attending: Obstetrics and Gynecology | Admitting: Obstetrics and Gynecology

## 2021-04-25 DIAGNOSIS — D500 Iron deficiency anemia secondary to blood loss (chronic): Secondary | ICD-10-CM | POA: Insufficient documentation

## 2021-04-25 DIAGNOSIS — N939 Abnormal uterine and vaginal bleeding, unspecified: Secondary | ICD-10-CM | POA: Insufficient documentation

## 2021-04-30 ENCOUNTER — Other Ambulatory Visit: Payer: Self-pay

## 2021-04-30 ENCOUNTER — Other Ambulatory Visit (HOSPITAL_COMMUNITY)
Admission: RE | Admit: 2021-04-30 | Discharge: 2021-04-30 | Disposition: A | Discharge status: Home / Self Care | Payer: Self-pay | Source: Ambulatory Visit | Attending: Obstetrics and Gynecology | Admitting: Obstetrics and Gynecology

## 2021-04-30 ENCOUNTER — Ambulatory Visit (INDEPENDENT_AMBULATORY_CARE_PROVIDER_SITE_OTHER): Payer: Self-pay | Admitting: Obstetrics and Gynecology

## 2021-04-30 ENCOUNTER — Encounter: Payer: Self-pay | Admitting: Obstetrics and Gynecology

## 2021-04-30 VITALS — BP 138/74 | Wt 290.0 lb

## 2021-04-30 DIAGNOSIS — N926 Irregular menstruation, unspecified: Secondary | ICD-10-CM

## 2021-04-30 DIAGNOSIS — D250 Submucous leiomyoma of uterus: Secondary | ICD-10-CM

## 2021-04-30 DIAGNOSIS — N921 Excessive and frequent menstruation with irregular cycle: Secondary | ICD-10-CM

## 2021-04-30 DIAGNOSIS — D251 Intramural leiomyoma of uterus: Secondary | ICD-10-CM

## 2021-04-30 MED ORDER — MEDROXYPROGESTERONE ACETATE 10 MG PO TABS
10.0000 mg | ORAL_TABLET | Freq: Three times a day (TID) | ORAL | 0 refills | Status: DC
Start: 2021-04-30 — End: 2021-06-09
  Filled 2021-04-30: qty 90, 30d supply, fill #0
  Filled 2021-05-01: qty 74, 25d supply, fill #0
  Filled 2021-06-03: qty 16, 6d supply, fill #1
  Filled 2021-06-09: qty 21, 7d supply, fill #2

## 2021-04-30 NOTE — Progress Notes (Signed)
Patient ID: Lauren Hensley, female   DOB: 1966-03-15, 55 y.o.   MRN: [id number]  Reason for Consult: Gynecologic Exam   Referred by Adrian Prows R, *  Subjective:     HPI:  Lauren Hensley is a 55 y.o. female.  She is following up today regarding chronic blood loss anemia.  She recently completed a pelvic ultrasound which showed multiple enlarged fibroids.  Her uterus measured 6.1 cm x 11.1 cm x 16.44 cm.  There are multiple subserosal and submucosal fibroids which were visualized.  The endometrium measured 8 mm.  The largest fibroid was submucosal and measuring 5.3 cm.  She was started on Depo-Provera in June but reports that she has continued to have daily bleeding.  She is following with hematology for IV iron infusions.  Gynecological History  Patient's last menstrual period was 03/03/2021.  Past Medical History:  Diagnosis Date   Anemia    Hypertension    Family History  Problem Relation Age of Onset   Diabetes Mother    COPD Mother    Heart Problems Mother    Emphysema Father    Hypercholesterolemia Brother    Diabetes Maternal Aunt    Obesity Maternal Aunt    Arthritis Maternal Aunt    Past Surgical History:  Procedure Laterality Date   COLONOSCOPY WITH PROPOFOL N/A 02/08/2021   Procedure: COLONOSCOPY WITH PROPOFOL;  Surgeon: Lin Landsman, MD;  Location: [facility] ENDOSCOPY;  Service: Gastroenterology;  Laterality: N/A;   ESOPHAGOGASTRODUODENOSCOPY N/A 02/08/2021   Procedure: ESOPHAGOGASTRODUODENOSCOPY (EGD);  Surgeon: Lin Landsman, MD;  Location: [facility] ENDOSCOPY;  Service: Gastroenterology;  Laterality: N/A;   GIVENS CAPSULE STUDY N/A 02/09/2021   Procedure: GIVENS CAPSULE STUDY;  Surgeon: Lesly Rubenstein, MD;  Location[facility] ENDOSCOPY;  Service: Endoscopy;  Laterality: N/A;    Short Social History:  Social History   Tobacco Use   Smoking status: Never   Smokeless tobacco: Never  Substance Use Topics   Alcohol use: Yes    Comment: occ    No  Known Allergies  Current Outpatient Medications  Medication Sig Dispense Refill   medroxyPROGESTERone (DEPO-PROVERA) 150 MG/ML injection Inject 1 mL (150 mg total) into the muscle every 3 (three) months. (Patient not taking: Reported on 04/30/2021) 1 mL 0   metoprolol succinate (TOPROL-XL) 25 MG 24 hr tablet Take 1 tablet by mouth daily. (Patient not taking: Reported on 04/30/2021)     metroNIDAZOLE (FLAGYL) 500 MG tablet Take 2 tablets ('1000mg'$  total) by mouth twice a day for one day.  (Or you can take all 4 tablets at once for one dose if you can tolerate it). 4 tablet 0   No current facility-administered medications for this visit.    Review of Systems  Constitutional: Negative for chills, fatigue, fever and unexpected weight change.  HENT: Negative for trouble swallowing.  Eyes: Negative for loss of vision.  Respiratory: Negative for cough, shortness of breath and wheezing.  Cardiovascular: Negative for chest pain, leg swelling, palpitations and syncope.  GI: Negative for abdominal pain, blood in stool, diarrhea, nausea and vomiting.  GU: Negative for difficulty urinating, dysuria, frequency and hematuria.  Musculoskeletal: Negative for back pain, leg pain and joint pain.  Skin: Negative for rash.  Neurological: Negative for dizziness, headaches, light-headedness, numbness and seizures.  Psychiatric: Negative for behavioral problem, confusion, depressed mood and sleep disturbance.       Objective:  Objective   Vitals:   04/30/21 1417  BP: 138/74  Weight: 290 lb (131.5 kg)  Body mass index is 43.45 kg/m.  Physical Exam Vitals and nursing note reviewed. Exam conducted with a chaperone present.  Constitutional:      Appearance: Normal appearance. She is well-developed.  HENT:     Head: Normocephalic and atraumatic.  Eyes:     Extraocular Movements: Extraocular movements intact.     Pupils: Pupils are equal, round, and reactive to light.  Cardiovascular:     Rate and Rhythm:  Normal rate and regular rhythm.  Pulmonary:     Effort: Pulmonary effort is normal. No respiratory distress.     Breath sounds: Normal breath sounds.  Abdominal:     General: Abdomen is flat.     Palpations: Abdomen is soft.  Genitourinary:    Comments: External: Normal appearing vulva. No lesions noted.  Speculum examination: Normal appearing cervix. Moderate blood in the vaginal vault, 20 cc with active slow bleeding. EMB performed.  Musculoskeletal:        General: No signs of injury.  Skin:    General: Skin is warm and dry.  Neurological:     Mental Status: She is alert and oriented to person, place, and time.  Psychiatric:        Behavior: Behavior normal.        Thought Content: Thought content normal.        Judgment: Judgment normal.   Endometrial Biopsy After discussion with the patient regarding her abnormal uterine bleeding I recommended that she proceed with an endometrial biopsy for further diagnosis. The risks, benefits, alternatives, and indications for an endometrial biopsy were discussed with the patient in detail. She understood the risks including infection, bleeding, cervical laceration and uterine perforation.  Verbal consent was obtained.   PROCEDURE NOTE:  Pipelle endometrial biopsy was performed using aseptic technique with iodine preparation.  The uterus was sounded to a length of 12 cm.  Adequate sampling was obtained with minimal blood loss.  The patient tolerated the procedure well.  Disposition will be pending pathology.    Assessment/Plan:    55 year old with menorrhagia secondary to enlarged fibroid uterus.  Patient has congestive heart failure and cardiomyopathy, mitral valve insufficiency and tricuspid valve insufficiency.  Ejection fraction in May 2022 was 45 to 50% EMB today Heavy bleeding continues- will start provera, rx sent.  Pelvic MRI for fibroid mapping.  Referral to vascular surgery for [place].  Bleeding precautions discussed.  Patient  should come to the emergency room if she is experiencing dizziness lightheadedness shortness of breath or chest pain.  Patient also, she is saturating more than 1 pad an hour.  Follow up in 2 weeks.  More than 20 minutes were spent face to face with the patient in the room, reviewing the medical record, labs and images, and coordinating care for the patient. The plan of management was discussed in detail and counseling was provided.     Adrian Prows MD [facility], [facility] 04/30/2021 2:44 PM

## 2021-04-30 NOTE — Patient Instructions (Signed)
Uterine Artery Embolization for Fibroids  Uterine artery embolization is a procedure to shrink uterine fibroids. Uterine fibroids are masses of tissue (tumors) that can develop in the womb (uterus). They are also called leiomyomas. This type of tumor is not cancerous (benign) and does not spread to other parts of the body outside of the pelvic area. The pelvic area is the part of the body between the hip bones. You can have one or many fibroids. Fibroids can vary in size, shape, weight, and where they growin the uterus. Some can become quite large. In this procedure, a thin plastic tube (catheter) is used to inject a chemical that blocks off the blood supply to the fibroid,which causes the fibroid to shrink. Tell a health care provider about: Any allergies you have. All medicines you are taking, including vitamins, herbs, eye drops, creams, and over-the-counter medicines. Any problems you or family members have had with anesthetic medicines. Any blood disorders you have. Any surgeries you have had. Any medical conditions you have. Whether you are pregnant or may be pregnant. What are the risks? Generally, this is a safe procedure. However, problems may occur, including: Bleeding. Allergic reactions to medicines or dyes. Damage to other structures or organs. Infection, including blood infection (septicemia). Injury to the uterus from decreased blood supply. Lack of menstrual periods (amenorrhea). Death of tissue cells (necrosis) around your bladder or vulva. Development of a hole between organs or from an organ to the surface of your skin (fistula). Blood clot in the legs (deep vein thrombosis) or lung (pulmonary embolus). Nausea and vomiting. What happens before the procedure? Staying hydrated Follow instructions from your health care provider about hydration, which may include: Up to 2 hours before the procedure - you may continue to drink clear liquids, such as water, clear fruit juice,  black coffee, and plain tea. Eating and drinking restrictions Follow instructions from your health care provider about eating and drinking, which may include: 8 hours before the procedure - stop eating heavy meals or foods such as meat, fried foods, or fatty foods. 6 hours before the procedure - stop eating light meals or foods, such as toast or cereal. 6 hours before the procedure - stop drinking milk or drinks that contain milk. 2 hours before the procedure - stop drinking clear liquids. Medicines Ask your health care provider about: Changing or stopping your regular medicines. This is especially important if you are taking diabetes medicines or blood thinners. Taking over-the-counter medicines, vitamins, herbs, and supplements. Taking medicines such as aspirin and ibuprofen. These medicines can thin your blood. Do not take these medicines unless your health care provider tells you to take them. You may be given antibiotic medicine to help prevent infection. You may be given medicine to prevent nausea and vomiting (antiemetic). General instructions Ask your health care provider how your surgical site will be marked or identified. You may be asked to shower with a germ-killing soap. Plan to have someone take you home from the hospital or clinic. If you will be going home right after the procedure, plan to have someone with you for 24 hours. You will be asked to empty your bladder. What happens during the procedure? To lower your risk of infection: Your health care team will wash or sanitize their hands. Hair may be removed from the surgical area. Your skin will be washed with soap. An IV will be inserted into one of your veins. You will be given one or more of the following: A medicine  to help you relax (sedative). A medicine to numb the area (local anesthetic). A small cut (incision) will be made in your groin. A catheter will be inserted into the main artery of your leg. The catheter  will be guided through the artery to your uterus. A series of images will be taken while dye is injected through the catheter in your groin. X-rays are taken at the same time. This is done to provide a road map of the blood supply to your uterus and fibroids. Tiny plastic spheres, about the size of sand grains, will be injected through the catheter. Metal coils may be used to help block the artery. The particles will lodge in tiny branches of the uterine artery that supplies blood to the fibroids. The procedure will be repeated on the artery that supplies the other side of the uterus. The catheter will be removed and pressure will be applied to stop the bleeding. A dressing will be placed over the incision. The procedure may vary among health care providers and hospitals. What happens after the procedure? Your blood pressure, heart rate, breathing rate, and blood oxygen level will be monitored until the medicines you were given have worn off. You will be given pain medicine as needed. You may be given medicine for nausea and vomiting as needed. Do not drive for 24 hours if you were given a sedative. Summary Uterine artery embolization is a procedure to shrink uterine fibroids by blocking the blood supply to the fibroid. You may be given a sedative and local anesthetic for the procedure. A catheter will be inserted into the main artery of your leg. The catheter will be guided through the artery to your uterus. After the procedure you will be given pain medicine and medicine for nausea as needed. Do not drive for 24 hours if you were given a sedative. This information is not intended to replace advice given to you by your health care provider. Make sure you discuss any questions you have with your healthcare provider. Document Revised: 08/27/2017 Document Reviewed: 12/17/2016 Elsevier Patient Education  2021 [employer].

## 2021-05-01 ENCOUNTER — Encounter: Payer: Self-pay | Admitting: Nurse Practitioner

## 2021-05-01 ENCOUNTER — Other Ambulatory Visit: Payer: Self-pay

## 2021-05-05 LAB — SURGICAL PATHOLOGY

## 2021-05-06 ENCOUNTER — Ambulatory Visit
Admission: RE | Admit: 2021-05-06 | Discharge: 2021-05-06 | Disposition: A | Discharge status: Home / Self Care | Payer: Self-pay | Source: Ambulatory Visit | Attending: Obstetrics and Gynecology | Admitting: Obstetrics and Gynecology

## 2021-05-06 ENCOUNTER — Other Ambulatory Visit: Payer: Self-pay

## 2021-05-06 DIAGNOSIS — D251 Intramural leiomyoma of uterus: Secondary | ICD-10-CM | POA: Insufficient documentation

## 2021-05-06 DIAGNOSIS — D250 Submucous leiomyoma of uterus: Secondary | ICD-10-CM | POA: Insufficient documentation

## 2021-05-06 MED ORDER — GADOBUTROL 1 MMOL/ML IV SOLN
10.0000 mL | Freq: Once | INTRAVENOUS | Status: AC | PRN
  Administered 2021-05-06: 10 mL via INTRAVENOUS

## 2021-05-07 ENCOUNTER — Ambulatory Visit: Payer: Self-pay | Admitting: Pharmacy Technician

## 2021-05-07 DIAGNOSIS — Z79899 Other long term (current) drug therapy: Secondary | ICD-10-CM

## 2021-05-08 ENCOUNTER — Inpatient Hospital Stay (HOSPITAL_BASED_OUTPATIENT_CLINIC_OR_DEPARTMENT_OTHER): Payer: Self-pay | Admitting: Nurse Practitioner

## 2021-05-08 ENCOUNTER — Telehealth (INDEPENDENT_AMBULATORY_CARE_PROVIDER_SITE_OTHER): Payer: Self-pay

## 2021-05-08 ENCOUNTER — Inpatient Hospital Stay: Payer: Self-pay | Attending: Internal Medicine

## 2021-05-08 ENCOUNTER — Inpatient Hospital Stay: Payer: Self-pay

## 2021-05-08 ENCOUNTER — Inpatient Hospital Stay: Payer: Self-pay | Admitting: Internal Medicine

## 2021-05-08 ENCOUNTER — Inpatient Hospital Stay: Payer: Self-pay | Attending: Nurse Practitioner

## 2021-05-08 ENCOUNTER — Other Ambulatory Visit: Payer: Self-pay

## 2021-05-08 VITALS — BP 133/83 | HR 65 | Temp 97.8°F | Resp 16 | Wt 291.0 lb

## 2021-05-08 VITALS — BP 149/73 | HR 51 | Resp 20

## 2021-05-08 DIAGNOSIS — I509 Heart failure, unspecified: Secondary | ICD-10-CM | POA: Insufficient documentation

## 2021-05-08 DIAGNOSIS — N920 Excessive and frequent menstruation with regular cycle: Secondary | ICD-10-CM | POA: Insufficient documentation

## 2021-05-08 DIAGNOSIS — D509 Iron deficiency anemia, unspecified: Secondary | ICD-10-CM

## 2021-05-08 DIAGNOSIS — I429 Cardiomyopathy, unspecified: Secondary | ICD-10-CM | POA: Insufficient documentation

## 2021-05-08 DIAGNOSIS — Z5900 Homelessness unspecified: Secondary | ICD-10-CM | POA: Insufficient documentation

## 2021-05-08 DIAGNOSIS — D500 Iron deficiency anemia secondary to blood loss (chronic): Secondary | ICD-10-CM | POA: Insufficient documentation

## 2021-05-08 DIAGNOSIS — Z5989 Other problems related to housing and economic circumstances: Secondary | ICD-10-CM | POA: Insufficient documentation

## 2021-05-08 DIAGNOSIS — D563 Thalassemia minor: Secondary | ICD-10-CM | POA: Insufficient documentation

## 2021-05-08 LAB — CBC WITH DIFFERENTIAL/PLATELET
Abs Immature Granulocytes: 0 10*3/uL (ref 0.00–0.07)
Basophils Absolute: 0.1 10*3/uL (ref 0.0–0.1)
Basophils Relative: 2 %
Eosinophils Absolute: 0.1 10*3/uL (ref 0.0–0.5)
Eosinophils Relative: 2 %
HCT: 29.2 % — ABNORMAL LOW (ref 36.0–46.0)
Hemoglobin: 8.8 g/dL — ABNORMAL LOW (ref 12.0–15.0)
Immature Granulocytes: 0 %
Lymphocytes Relative: 43 %
Lymphs Abs: 1.6 10*3/uL (ref 0.7–4.0)
MCH: 23 pg — ABNORMAL LOW (ref 26.0–34.0)
MCHC: 30.1 g/dL (ref 30.0–36.0)
MCV: 76.4 fL — ABNORMAL LOW (ref 80.0–100.0)
Monocytes Absolute: 0.3 10*3/uL (ref 0.1–1.0)
Monocytes Relative: 9 %
Neutro Abs: 1.6 10*3/uL — ABNORMAL LOW (ref 1.7–7.7)
Neutrophils Relative %: 44 %
Platelets: 216 10*3/uL (ref 150–400)
RBC: 3.82 MIL/uL — ABNORMAL LOW (ref 3.87–5.11)
RDW: 15.9 % — ABNORMAL HIGH (ref 11.5–15.5)
WBC: 3.5 10*3/uL — ABNORMAL LOW (ref 4.0–10.5)
nRBC: 0 % (ref 0.0–0.2)

## 2021-05-08 LAB — IRON AND TIBC
Iron: 41 ug/dL (ref 28–170)
Saturation Ratios: 11 % (ref 10.4–31.8)
TIBC: 361 ug/dL (ref 250–450)
UIBC: 320 ug/dL

## 2021-05-08 LAB — FERRITIN: Ferritin: 17 ng/mL (ref 11–307)

## 2021-05-08 MED ORDER — SODIUM CHLORIDE 0.9 % IV SOLN
INTRAVENOUS | Status: DC
  Filled 2021-05-08: qty 250

## 2021-05-08 MED ORDER — SODIUM CHLORIDE 0.9 % IV SOLN: 200.0000 mg | Freq: Once | INTRAVENOUS | Status: DC

## 2021-05-08 MED ORDER — IRON SUCROSE 20 MG/ML IV SOLN
200.0000 mg | Freq: Once | INTRAVENOUS | Status: AC
  Administered 2021-05-08: 200 mg via INTRAVENOUS
  Filled 2021-05-08: qty 10

## 2021-05-08 NOTE — Telephone Encounter (Signed)
Documentation only.

## 2021-05-08 NOTE — Progress Notes (Signed)
Completed [facility] application and contract.  Patient agreed to all terms of the [facility] contract.    Patient approved to receive medication assistance at [facility] until time for re-certification in 2023, and as long as eligibility criteria continues to be met.    Provided patient with community resource material based on her particular needs.    Lauren Hensley Care Manager [facility]  

## 2021-05-08 NOTE — Progress Notes (Signed)
[place] at [facility] [address], [place], [place] [address] [phone] (phone) [phone] (fax)  Clinic Day:  05/08/2021  Referring physician: Homero Fellers, *  CC: Anemia  History of Presenting Illness:  Lauren Hensley is a 55 y.o. female with a history of CHF, cardiomyopathy, symptomatic anemia, cocaine use, who was referred in consultation with Dr Gilman Schmidt for assessment and management of anemia.  Patient was previously hospitalized with severe anemia with hemoglobin of 5.3.  She received transfusion and symptoms improved.  She underwent evaluation by GI and underwent endoscopy, colonoscopy, and capsule studies. Gastric biopsies confirm presence of h. Pylori infection. She was treated with omperazole, clarithromycin, and amoxicillin.  Patient reported having heavy menstrual cycles with passage of large blood clots.  Has not been followed by gynecology until recently.  She has been staying with family recently.  Does not have a telephone. Does not have insurance. Is worried about cost of treatment and transportation to appointments. She denies abnormal bleeding or bruising. Denies hemoptysis.  Denies melena or hematochezia. Endorses pica. Heavy menstrual periods.   Interval History: Patient returns to clinic for labs and re-evaluation and consideration of additional IV venofer for iron deficiency anemia. She continues to have vaginal bleeding related to her fibroids which is managed by Dr. Gilman Schmidt. She has started on provera. She's awaiting evaluation for possible uterine artery embolization. She continues to be fatigued but generally feels somewhat better since taking IV iron. Denies any neurologic complaints. Denies recent fevers or illnesses. Denies any easy bleeding or bruising. No melena or hematochezia. No pica or restless leg. Reports good appetite and denies weight loss. Denies chest pain. Denies any nausea, vomiting, constipation, or  diarrhea. Denies urinary complaints. Patient offers no further specific complaints today.   Review of Systems  Constitutional:  Positive for fatigue. Negative for appetite change and unexpected weight change.  HENT:   Negative for mouth sores, sore throat and trouble swallowing.   Respiratory:  Negative for chest tightness and shortness of breath.   Cardiovascular:  Negative for leg swelling.  Gastrointestinal:  Negative for abdominal distention, abdominal pain, blood in stool, constipation, diarrhea, nausea, rectal pain and vomiting.  Endocrine: Negative for hot flashes.  Genitourinary:  Positive for menstrual problem. Negative for bladder incontinence, dysuria, pelvic pain, vaginal bleeding and vaginal discharge.   Musculoskeletal:  Negative for flank pain and neck stiffness.  Skin:  Negative for itching, rash and wound.  Neurological:  Negative for dizziness, headaches, light-headedness and numbness.  Psychiatric/Behavioral:  Negative for confusion, depression and sleep disturbance. The patient is not nervous/anxious.     Current Outpatient Medications on File Prior to Visit  Medication Sig Dispense Refill   medroxyPROGESTERone (PROVERA) 10 MG tablet Take 1 tablet (10 mg total) by mouth in the morning, at noon, and at bedtime. 90 tablet 0   metoprolol succinate (TOPROL-XL) 25 MG 24 hr tablet Take 1 tablet by mouth daily. (Patient not taking: No sig reported)     metroNIDAZOLE (FLAGYL) 500 MG tablet Take 2 tablets ('1000mg'$  total) by mouth twice a day for one day.  (Or you can take all 4 tablets at once for one dose if you can tolerate it). (Patient not taking: Reported on 05/08/2021) 4 tablet 0   [DISCONTINUED] medroxyPROGESTERone (DEPO-PROVERA) 150 MG/ML injection Inject 1 mL (150 mg total) into the muscle every 3 (three) months. (Patient not taking: Reported on 04/30/2021) 1 mL 0   No current facility-administered medications on file prior to  visit.   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   Moderate tricuspid insufficiency 02/26/2021   SOBOE (shortness of breath on exertion) 02/26/2021   Peripheral edema    Symptomatic anemia 02/01/2021   Weakness 02/01/2021   Acute CHF (congestive heart failure) ([place]) 02/01/2021   Stool guaiac positive 02/01/2021   Thrombocytopenia ([place]) 02/01/2021   Family History  Problem Relation Age of Onset   Diabetes Mother    COPD Mother    Heart Problems Mother    Emphysema Father    Hypercholesterolemia Brother    Diabetes Maternal Aunt    Obesity Maternal Aunt    Arthritis Maternal Aunt    Past Surgical History:  Procedure Laterality Date   COLONOSCOPY WITH PROPOFOL N/A 02/08/2021   Procedure: COLONOSCOPY WITH PROPOFOL;  Surgeon: Lin Landsman, MD;  Location: [facility] ENDOSCOPY;  Service: Gastroenterology;  Laterality: N/A;   ESOPHAGOGASTRODUODENOSCOPY N/A 02/08/2021   Procedure: ESOPHAGOGASTRODUODENOSCOPY (EGD);  Surgeon: Lin Landsman, MD;  Location: [facility] ENDOSCOPY;  Service: Gastroenterology;  Laterality: N/A;   GIVENS CAPSULE STUDY N/A 02/09/2021   Procedure: GIVENS CAPSULE STUDY;  Surgeon: Lesly Rubenstein, MD;  Location: [facility] ENDOSCOPY;  Service: Endoscopy;  Laterality: N/A;   No Known Allergies  VITALS:  Last menstrual period 03/03/2021.  Wt Readings from Last 3 Encounters:  04/30/21 290 lb (131.5 kg)  04/22/21 284 lb (128.8 kg)  03/05/21 291 lb 12.8 oz (132.4 kg)    There is no height or weight on file to calculate BMI.  Performance status (ECOG): 1 - Symptomatic but completely ambulatory   OBJECTIVE: Physical Exam Nursing note reviewed.  Constitutional:      Appearance: She is not ill-appearing.  HENT:     Head: Normocephalic.  Eyes:     General: No scleral icterus.    Conjunctiva/sclera: Conjunctivae normal.  Cardiovascular:     Rate and  Rhythm: Normal rate and regular rhythm.  Pulmonary:     Effort: Pulmonary effort is normal. No respiratory distress.     Breath sounds: Normal breath sounds.  Abdominal:     General: There is no distension.     Tenderness: There is no abdominal tenderness. There is no guarding.  Musculoskeletal:        General: No swelling or deformity.     Cervical back: Neck supple.  Lymphadenopathy:     Cervical: No cervical adenopathy.  Skin:    General: Skin is warm and dry.     Coloration: Skin is not pale.  Neurological:     Mental Status: She is alert and oriented to person, place, and time.  Psychiatric:        Behavior: Behavior normal.        Thought Content: Thought content normal.        Judgment: Judgment normal.    LABS:   CBC Latest Ref Rng & Units 03/13/2021 02/10/2021 02/09/2021  WBC 4.0 - 10.5 K/uL 6.4 5.8 5.2  Hemoglobin 12.0 - 15.0 g/dL 8.8(L) 9.0(L) 8.3(L)  Hematocrit 36.0 - 46.0 % 28.8(L) 30.2(L) 27.7(L)  Platelets 150 - 400 K/uL 185 521(H) 475(H)   CMP Latest Ref Rng & Units 03/13/2021 02/10/2021 02/07/2021  Glucose 70 - 99 mg/dL 82 90 86  BUN 6 - 20 mg/dL '15 7 7  '$ Creatinine 0.44 - 1.00 mg/dL 0.75 0.74 0.72  Sodium 135 - 145 mmol/L  136 139 140  Potassium 3.5 - 5.1 mmol/L 4.0 4.2 4.1  Chloride 98 - 111 mmol/L 105 108 108  CO2 22 - 32 mmol/L '25 25 25  '$ Calcium 8.9 - 10.3 mg/dL 8.9 9.2 9.1  Total Protein 6.5 - 8.1 g/dL 7.3 - -  Total Bilirubin 0.3 - 1.2 mg/dL 0.9 - -  Alkaline Phos 38 - 126 U/L 33(L) - -  AST 15 - 41 U/L 15 - -  ALT 0 - 44 U/L 11 - -   No results found for: CEA1 / No results found for: CEA1 No results found for: PSA1 No results found for: [id number] No results found for: [id number]  Lab Results  Component Value Date   TOTALPROTELP 6.8 03/13/2021   ALBUMINELP 3.6 03/13/2021   A1GS 0.2 03/13/2021   A2GS 0.5 03/13/2021   BETS 1.0 03/13/2021   GAMS 1.4 03/13/2021   MSPIKE Not Observed 03/13/2021   SPEI Comment 03/13/2021   Lab Results  Component Value  Date   TIBC 407 03/13/2021   TIBC 508 (H) 02/01/2021   FERRITIN 9 (L) 03/13/2021   FERRITIN 2 (L) 02/01/2021   IRONPCTSAT 20 03/13/2021   IRONPCTSAT 4 (L) 02/01/2021   Lab Results  Component Value Date   LDH 127 03/13/2021    STUDIES:  MR PELVIS W WO CONTRAST  Result Date: 05/07/2021 CLINICAL DATA:  Symptomatic uterine fibroids.  Surgical planning. EXAM: MRI PELVIS WITHOUT AND WITH CONTRAST TECHNIQUE: Multiplanar multisequence MR imaging of the pelvis was performed both before and after administration of intravenous contrast. CONTRAST:  75m GADAVIST GADOBUTROL 1 MMOL/ML IV SOLN COMPARISON:  04/25/2021 FINDINGS: Lower Urinary Tract: No bladder or urethral abnormality identified. Bowel:  Unremarkable visualized pelvic bowel loops. Vascular/Lymphatic: No pathologically enlarged lymph nodes or other significant abnormality. Reproductive: -- Uterus: Measures 12.0 x 8.2 x 10.8 cm (volume = 560 cm^3). 6-7 uterine fibroids are seen which range in size from less than 1 cm to 5.9 cm in maximum diameter. The largest fibroid is located in the left uterine corpus and has a submucosal component indenting the endometrial cavity. Focal adenomyoma is also seen in the right lateral uterine corpus measuring approximately 4 cm. No evidence of abnormal endometrial thickening. Cervix and vagina are unremarkable in appearance. -- Intracavitary fibroids:  None. -- Pedunculated fibroids: None. -- Fibroid contrast enhancement: All fibroids show contrast enhancement, with the largest 5.9 cm central submucosal fibroid showing some areas of central cystic degeneration. -- Right ovary:  Not visualized, however no adnexal mass identified. -- Left ovary:  Not visualized, however no adnexal mass identified. Other: Tiny amount of free fluid noted. Musculoskeletal:  Unremarkable. IMPRESSION: Multiple uterine fibroids, largest measuring 5.9 cm and submucosal in location. No intracavitary or pedunculated fibroids identified. Focal  adenomyoma in the right lateral uterine corpus. No evidence of adnexal mass. Electronically Signed   By: JMarlaine HindM.D.   On: 05/07/2021 14:07   MS DIGITAL SCREENING TOMO BILATERAL  Result Date: 04/23/2021 CLINICAL DATA:  Screening. EXAM: DIGITAL SCREENING BILATERAL MAMMOGRAM WITH TOMOSYNTHESIS AND CAD TECHNIQUE: Bilateral screening digital craniocaudal and mediolateral oblique mammograms were obtained. Bilateral screening digital breast tomosynthesis was performed. The images were evaluated with computer-aided detection. COMPARISON:  Previous exam(s). ACR Breast Density Category b: There are scattered areas of fibroglandular density. FINDINGS: There are no findings suspicious for malignancy. IMPRESSION: No mammographic evidence of malignancy. A result letter of this screening mammogram will be mailed directly to the patient. RECOMMENDATION: Screening mammogram in one year. (Code:SM-B-01Y) BI-RADS CATEGORY  1: Negative. Electronically Signed   By: Lajean Manes M.D.   On: 04/23/2021 15:44  US PELVIC COMPLETE WITH TRANSVAGINAL  Result Date: 04/25/2021 CLINICAL DATA:  Acute blood loss anemia, abnormal uterine bleeding. EXAM: TRANSABDOMINAL AND TRANSVAGINAL ULTRASOUND OF PELVIS TECHNIQUE: Both transabdominal and transvaginal ultrasound examinations of the pelvis were performed. Transabdominal technique was performed for global imaging of the pelvis including uterus, ovaries, adnexal regions, and pelvic cul-de-sac. It was necessary to proceed with endovaginal exam following the transabdominal exam to visualize the endometrium and cervix. COMPARISON:  None FINDINGS: Uterus Measurements: 6.1 x 11.1 x 16.4 cm = volume: 586 mL. There are multiple subserosal and submucosal intrauterine masses demonstrating heterogeneous hypoechogenicity and internal vascularity most in keeping with multiple uterine fibroids. At least 3 such masses are seen measuring up to 5.3 x 3.7 x 4.6 cm in size with the dominant fibroid noted  within the submucosal location distorting and displacing the endometrial stripe. The cervix is unremarkable. Endometrium Thickness: 8 mm. The endometrial stripe is segmentally effaced by the dominant submucosal fibroid. Right ovary Measurements: 1.9 x 0.9 x 1.9 cm = volume: 2 mL. Normal appearance/no adnexal mass. Left ovary Measurements: 2.5 x 1.4 x 1.5 cm = volume: 3 mL. Normal appearance/no adnexal mass. Other findings There is small simple appearing free fluid seen within the cul-de-sac. IMPRESSION: Multiple uterine fibroids with the dominant fibroid seen within the submucosal location measuring 5.3 cm in greatest dimension. Electronically Signed   By: Fidela Salisbury MD   On: 04/25/2021 22:31      ASSESSMENT & PLAN:   Assessment:  Naviyah Schear is a 55 y.o. female who returns to clinic for evaluation of iron deficiency anemia.   Iron Deficiency Anemia- secondary to heavy bleeding related to fibroids and previous h pylori infection. Previous EGD, capsule, and colonoscopy with GI. Awaiting possible [place] for management. On provera per gyn to lighten periods. Labs reviewed and consistent with iron deficiency. She has now received venofer 200 mg x 5. Hemoglobin is stable at 8.8 post iron. Iron studies today pending. Discussed with Dr. Rogue Bussing who recommends venofer every other week and to return to clinic for labs and re-evaluation in 8 weeks.  Symptomatic anemia- hemoglobin 8.8, microcytic. Goal hemoglobin > 8. If hemoglobin < 8, plan for transfusion.  Beta thalassemia minor- Hgb A decreased, Hgb A2 elevated. Findings suspicious for beta thalassemia minor. Dorrect iron deficiency and re-evaluate if symptomatic or microcytosis persists.  Social determinants of health- homelessness. Currently living with family. We will continue to arrange transportation for her to and from appts.   Plan: Venofer every other week RTC in 8 weeks for labs (cbc, ferritin, iron studies) Day later- See Dr. Rogue Bussing and  +/- venofer   I discussed the assessment and treatment plan with the patient.  The patient was provided an opportunity to ask questions and all were answered.  The patient agreed with the plan and demonstrated an understanding of the instructions.  The patient was advised to call back if the symptoms worsen or if the condition fails to improve as anticipated.  Lauren Rutter, DNP, AGNP-C [place] at [facility] [phone] (clinic)

## 2021-05-08 NOTE — Progress Notes (Signed)
Pt states she is following OBGYN for fibroids and bleeding. States she was started on Provera recently. Reports that she is feeling better overall. Still as cravings for ice.

## 2021-05-08 NOTE — Patient Instructions (Signed)

## 2021-05-15 ENCOUNTER — Other Ambulatory Visit: Payer: Self-pay

## 2021-05-21 ENCOUNTER — Inpatient Hospital Stay: Payer: Self-pay

## 2021-05-22 ENCOUNTER — Ambulatory Visit: Payer: Self-pay | Admitting: Obstetrics and Gynecology

## 2021-05-27 NOTE — Progress Notes (Signed)
Letter mailed from [facility] to notify of normal mammogram results.  Patient to return in one year for annual screening.  Copy to [facility]. 

## 2021-05-28 ENCOUNTER — Encounter: Payer: Self-pay | Admitting: Nurse Practitioner

## 2021-05-29 ENCOUNTER — Encounter: Payer: Self-pay | Admitting: Nurse Practitioner

## 2021-06-03 ENCOUNTER — Other Ambulatory Visit: Payer: Self-pay

## 2021-06-03 ENCOUNTER — Ambulatory Visit: Payer: Medicaid Other

## 2021-06-03 ENCOUNTER — Encounter: Payer: Self-pay | Admitting: Nurse Practitioner

## 2021-06-04 ENCOUNTER — Other Ambulatory Visit: Payer: Self-pay

## 2021-06-05 ENCOUNTER — Inpatient Hospital Stay: Payer: Self-pay

## 2021-06-05 ENCOUNTER — Inpatient Hospital Stay: Payer: Medicaid Other

## 2021-06-09 ENCOUNTER — Ambulatory Visit: Payer: Medicaid Other

## 2021-06-09 ENCOUNTER — Other Ambulatory Visit: Payer: Self-pay | Admitting: Obstetrics and Gynecology

## 2021-06-09 ENCOUNTER — Other Ambulatory Visit: Payer: Self-pay

## 2021-06-09 ENCOUNTER — Encounter: Payer: Self-pay | Admitting: Nurse Practitioner

## 2021-06-09 DIAGNOSIS — N921 Excessive and frequent menstruation with irregular cycle: Secondary | ICD-10-CM

## 2021-06-09 MED ORDER — MEDROXYPROGESTERONE ACETATE 10 MG PO TABS
10.0000 mg | ORAL_TABLET | Freq: Three times a day (TID) | ORAL | 0 refills | Status: DC
  Filled 2021-06-09: qty 90, 30d supply, fill #0
  Filled 2021-06-13: qty 61, 21d supply, fill #0

## 2021-06-10 ENCOUNTER — Telehealth: Payer: Self-pay

## 2021-06-10 ENCOUNTER — Other Ambulatory Visit: Payer: Self-pay

## 2021-06-10 NOTE — Telephone Encounter (Signed)
I called patient to encourage her to schedule appt with [facility] per [facility]'s request. Dr Gilman Schmidt "talked to Dr. Ronalee Belts directly about this patient at the hospital and he is willing to see her".   The pt advised that she doesn't have a phone and this number belongs to her niece, Lauren Hensley. I offered to assist in scheduling the appt for her to which she accepted. She had a concern about transportation and I advised that we can arrange that as well. She did say it was ok to leave a message on her niece's phone VM [phone] regarding appt. She also said she has access to her my chart.  After ending the call I placed a call to Lauren Hensley, referral coord. @ [facility] and left her a msg regarding the patient. I will also copy her on this note so that an appt/transport can be scheduled.

## 2021-06-13 ENCOUNTER — Other Ambulatory Visit: Payer: Self-pay

## 2021-06-13 ENCOUNTER — Encounter: Payer: Self-pay | Admitting: Nurse Practitioner

## 2021-06-19 ENCOUNTER — Inpatient Hospital Stay: Payer: Medicaid Other | Attending: Nurse Practitioner

## 2021-06-19 ENCOUNTER — Other Ambulatory Visit: Payer: Self-pay

## 2021-06-19 ENCOUNTER — Inpatient Hospital Stay: Payer: Medicaid Other

## 2021-06-19 VITALS — BP 127/74 | HR 54 | Temp 96.8°F | Resp 18

## 2021-06-19 DIAGNOSIS — D563 Thalassemia minor: Secondary | ICD-10-CM | POA: Insufficient documentation

## 2021-06-19 DIAGNOSIS — D500 Iron deficiency anemia secondary to blood loss (chronic): Secondary | ICD-10-CM | POA: Insufficient documentation

## 2021-06-19 DIAGNOSIS — D509 Iron deficiency anemia, unspecified: Secondary | ICD-10-CM

## 2021-06-19 DIAGNOSIS — Z5900 Homelessness unspecified: Secondary | ICD-10-CM | POA: Insufficient documentation

## 2021-06-19 DIAGNOSIS — I429 Cardiomyopathy, unspecified: Secondary | ICD-10-CM | POA: Insufficient documentation

## 2021-06-19 DIAGNOSIS — N920 Excessive and frequent menstruation with regular cycle: Secondary | ICD-10-CM | POA: Insufficient documentation

## 2021-06-19 DIAGNOSIS — I509 Heart failure, unspecified: Secondary | ICD-10-CM | POA: Insufficient documentation

## 2021-06-19 MED ORDER — IRON SUCROSE 20 MG/ML IV SOLN
200.0000 mg | Freq: Once | INTRAVENOUS | Status: AC
  Administered 2021-06-19: 200 mg via INTRAVENOUS
  Filled 2021-06-19: qty 10

## 2021-06-19 MED ORDER — SODIUM CHLORIDE 0.9 % IV SOLN: 200.0000 mg | Freq: Once | INTRAVENOUS | Status: DC

## 2021-06-19 NOTE — Telephone Encounter (Signed)
That is great news! Thank you so much for this!!!!

## 2021-06-19 NOTE — Patient Instructions (Addendum)
[facility] [place] [facility]  Discharge Instructions: Thank you for choosing [facility] to provide your oncology and hematology care.  If you have a lab appointment with the Cancer Center, please go directly to the Cancer Center and check in at the registration area.  Wear comfortable clothing and clothing appropriate for easy access to any Portacath or PICC line.   We strive to give you quality time with your provider. You may need to reschedule your appointment if you arrive late (15 or more minutes).  Arriving late affects you and other patients whose appointments are after yours.  Also, if you miss three or more appointments without notifying the office, you may be dismissed from the clinic at the provider's discretion.      For prescription refill requests, have your pharmacy contact our office and allow 72 hours for refills to be completed.    Today you received the following chemotherapy and/or immunotherapy agents VENOFER      To help prevent nausea and vomiting after your treatment, we encourage you to take your nausea medication as directed.  BELOW ARE SYMPTOMS THAT SHOULD BE REPORTED IMMEDIATELY: *FEVER GREATER THAN 100.4 F (38 C) OR HIGHER *CHILLS OR SWEATING *NAUSEA AND VOMITING THAT IS NOT CONTROLLED WITH YOUR NAUSEA MEDICATION *UNUSUAL SHORTNESS OF BREATH *UNUSUAL BRUISING OR BLEEDING *URINARY PROBLEMS (pain or burning when urinating, or frequent urination) *BOWEL PROBLEMS (unusual diarrhea, constipation, pain near the anus) TENDERNESS IN MOUTH AND THROAT WITH OR WITHOUT PRESENCE OF ULCERS (sore throat, sores in mouth, or a toothache) UNUSUAL RASH, SWELLING OR PAIN  UNUSUAL VAGINAL DISCHARGE OR ITCHING   Items with * indicate a potential emergency and should be followed up as soon as possible or go to the Emergency Department if any problems should occur.  Please show the CHEMOTHERAPY ALERT CARD or IMMUNOTHERAPY ALERT CARD at check-in to  the Emergency Department and triage nurse.  Should you have questions after your visit or need to cancel or reschedule your appointment, please contact [facility] [place] [facility]  [phone] and follow the prompts.  Office hours are 8:00 a.m. to 4:30 p.m. Monday - Friday. Please note that voicemails left after 4:00 p.m. may not be returned until the following business day.  We are closed weekends and major holidays. You have access to a nurse at all times for urgent questions. Please call the main number to the clinic [phone] and follow the prompts.  For any non-urgent questions, you may also contact your provider using MyChart. We now offer e-Visits for anyone 18 and older to request care online for non-urgent symptoms. For details visit [email or web address].   Also download the MyChart app! Go to the app store, search "MyChart", open the app, select Lauren Hensley, and log in with your MyChart username and password.  Due to Covid, a mask is required upon entering the hospital/clinic. If you do not have a mask, one will be given to you upon arrival. For doctor visits, patients may have 1 support person aged 18 or older with them. For treatment visits, patients cannot have anyone with them due to current Covid guidelines and our immunocompromised population.   Iron Sucrose Injection What is this medication? IRON SUCROSE (EYE ern SOO krose) treats low levels of iron (iron deficiency anemia) in people with kidney disease. Iron is a mineral that plays an important role in making red blood cells, which carry oxygen from your lungs to the rest of your body. This medicine may   be used for other purposes; ask your health care provider or pharmacist if you have questions. COMMON BRAND NAME(S): Venofer What should I tell my care team before I take this medication? They need to know if you have any of these conditions: Anemia not caused by low iron levels Heart disease High levels  of iron in the blood Kidney disease Liver disease An unusual or allergic reaction to iron, other medications, foods, dyes, or preservatives Pregnant or trying to get pregnant Breast-feeding How should I use this medication? This medication is for infusion into a vein. It is given in a hospital or clinic setting. Talk to your care team about the use of this medication in children. While this medication may be prescribed for children as young as 2 years for selected conditions, precautions do apply. Overdosage: If you think you have taken too much of this medicine contact a poison control center or emergency room at once. NOTE: This medicine is only for you. Do not share this medicine with others. What if I miss a dose? It is important not to miss your dose. Call your care team if you are unable to keep an appointment. What may interact with this medication? Do not take this medication with any of the following: Deferoxamine Dimercaprol Other iron products This medication may also interact with the following: Chloramphenicol Deferasirox This list may not describe all possible interactions. Give your health care provider a list of all the medicines, herbs, non-prescription drugs, or dietary supplements you use. Also tell them if you smoke, drink alcohol, or use illegal drugs. Some items may interact with your medicine. What should I watch for while using this medication? Visit your care team regularly. Tell your care team if your symptoms do not start to get better or if they get worse. You may need blood work done while you are taking this medication. You may need to follow a special diet. Talk to your care team. Foods that contain iron include: whole grains/cereals, dried fruits, beans, or peas, leafy green vegetables, and organ meats (liver, kidney). What side effects may I notice from receiving this medication? Side effects that you should report to your care team as soon as  possible: Allergic reactions-skin rash, itching, hives, swelling of the face, lips, tongue, or throat Low blood pressure-dizziness, feeling faint or lightheaded, blurry vision Shortness of breath Side effects that usually do not require medical attention (report to your care team if they continue or are bothersome): Flushing Headache Joint pain Muscle pain Nausea Pain, redness, or irritation at injection site This list may not describe all possible side effects. Call your doctor for medical advice about side effects. You may report side effects to FDA at 1-800-FDA-1088. Where should I keep my medication? This medication is given in a hospital or clinic and will not be stored at home. NOTE: This sheet is a summary. It may not cover all possible information. If you have questions about this medicine, talk to your doctor, pharmacist, or health care provider.  2022 Elsevier/Gold Standard (2020-12-10 12:52:06)  

## 2021-06-22 NOTE — Progress Notes (Signed)
MRN : [id number]  Lauren Hensley is a 55 y.o. (1966-03-22) female who presents with chief complaint of painful bleeding fibroids.  History of Present Illness:   Location: uterine fibroids Character/quality of the symptom:  cramping pain Severity:  increasing over the past couple years Duration:  days at a time Timing/onset:  episodic and days at a time Aggravating/context:  none Relieving/modifying:  motrin and rest  This has  been a steadily increasing problem and is associated with irregular vaginal bleeding  to the point she has been chronically anemic.  At this point hysterectomy has been recommended but the patient does not wish to proceed with surgery and therefore embolization was brought to our attention.  Transvaginal ultrasound was performed 04/25/2021 which demonstrates at least 3 masses consistent with fibroids measuring up to 5.3 cm x 3.7 cm x 4.6 cm in size they appear to be submucosal.  MRI of the pelvis with and without contrast was subsequently performed May 07, 2021 which showed 6-7 uterine fibroids ranging in size from 5.9 cm in maximal diameter to less than 1 cm.  No outpatient medications have been marked as taking for the 06/23/21 encounter (Appointment) with Delana Meyer, Dolores Lory, MD.    Past Medical History:  Diagnosis Date   Anemia    Hypertension     Past Surgical History:  Procedure Laterality Date   COLONOSCOPY WITH PROPOFOL N/A 02/08/2021   Procedure: COLONOSCOPY WITH PROPOFOL;  Surgeon: Lin Landsman, MD;  Location: [facility] ENDOSCOPY;  Service: Gastroenterology;  Laterality: N/A;   ESOPHAGOGASTRODUODENOSCOPY N/A 02/08/2021   Procedure: ESOPHAGOGASTRODUODENOSCOPY (EGD);  Surgeon: Lin Landsman, MD;  Location: [facility] ENDOSCOPY;  Service: Gastroenterology;  Laterality: N/A;   GIVENS CAPSULE STUDY N/A 02/09/2021   Procedure: GIVENS CAPSULE STUDY;  Surgeon: Lesly Rubenstein, MD;  Location: [facility] ENDOSCOPY;  Service: Endoscopy;  Laterality: N/A;     Social History Social History   Tobacco Use   Smoking status: Never   Smokeless tobacco: Never  Vaping Use   Vaping Use: Never used  Substance Use Topics   Alcohol use: Yes    Comment: occ   Drug use: Not Currently    Types: Marijuana, Cocaine    Family History Family History  Problem Relation Age of Onset   Diabetes Mother    COPD Mother    Heart Problems Mother    Emphysema Father    Hypercholesterolemia Brother    Diabetes Maternal Aunt    Obesity Maternal Aunt    Arthritis Maternal Aunt     No Known Allergies   REVIEW OF SYSTEMS (Negative unless checked)  Constitutional: [] Weight loss  [] Fever  [] Chills Cardiac: [] Chest pain   [] Chest pressure   [] Palpitations   [] Shortness of breath when laying flat   [] Shortness of breath with exertion. Vascular:  [] Pain in legs with walking   [] Pain in legs at rest  [] History of DVT   [] Phlebitis   [] Swelling in legs   [] Varicose veins   [] Non-healing ulcers Pulmonary:   [] Uses home oxygen   [] Productive cough   [] Hemoptysis   [] Wheeze  [] COPD   [] Asthma Neurologic:  [] Dizziness   [] Seizures   [] History of stroke   [] History of TIA  [] Aphasia   [] Vissual changes   [] Weakness or numbness in arm   [] Weakness or numbness in leg Musculoskeletal:   [] Joint swelling   [] Joint pain   [] Low back pain Hematologic:  [] Easy bruising  [] Easy bleeding   [] Hypercoagulable state   [] Anemic Gastrointestinal:  []   Diarrhea   [] Vomiting  [] Gastroesophageal reflux/heartburn   [] Difficulty swallowing. Genitourinary:  [] Chronic kidney disease   [] Difficult urination  [] Frequent urination   [] Blood in urine Skin:  [] Rashes   [] Ulcers  Psychological:  [] History of anxiety   []  History of major depression.  Physical Examination  There were no vitals filed for this visit. There is no height or weight on file to calculate BMI. Gen: WD/WN, NAD Head: [place]/AT, No temporalis wasting.  Ear/Nose/Throat: Hearing grossly intact, nares w/o erythema or  drainage Eyes: PER, EOMI, sclera nonicteric.  Neck: Supple, no masses.  No bruit or JVD.  Pulmonary:  Good air movement, no audible wheezing, no use of accessory muscles.  Cardiac: RRR, normal S1, S2, no Murmurs. Vascular:   Vessel Right Left  Radial Palpable Palpable  Gastrointestinal: soft, non-distended. No guarding/no peritoneal signs.  Musculoskeletal: M/S 5/5 throughout.  No visible deformity.  Neurologic: CN 2-12 intact. Pain and light touch intact in extremities.  Symmetrical.  Speech is fluent. Motor exam as listed above. Psychiatric: Judgment intact, Mood & affect appropriate for pt's clinical situation. Dermatologic: No rashes or ulcers noted.  No changes consistent with cellulitis.   CBC Lab Results  Component Value Date   WBC 3.5 (L) 05/08/2021   HGB 8.8 (L) 05/08/2021   HCT 29.2 (L) 05/08/2021   MCV 76.4 (L) 05/08/2021   PLT 216 05/08/2021    BMET    Component Value Date/Time   NA 136 03/13/2021 1519   K 4.0 03/13/2021 1519   CL 105 03/13/2021 1519   CO2 25 03/13/2021 1519   GLUCOSE 82 03/13/2021 1519   BUN 15 03/13/2021 1519   CREATININE 0.75 03/13/2021 1519   CALCIUM 8.9 03/13/2021 1519   GFRNONAA >60 03/13/2021 1519   CrCl cannot be calculated (Patient's most recent lab result is older than the maximum 21 days allowed.).  COAG Lab Results  Component Value Date   INR 1.3 (H) 02/01/2021    Radiology No results found.   Assessment/Plan 1. Vaginal bleeding The patient has a history of painful vaginal bleeding with multiple documented fibroids.  Transvaginal ultrasound was performed 04/25/2021 which demonstrates at least 3 masses consistent with fibroids measuring up to 5.3 cm x 3.7 cm x 4.6 cm in size they appear to be submucosal.  I have personally reviewed the MRI.  The MRI of the pelvis with and without contrast was subsequently performed May 07, 2021 which showed 6-7 uterine fibroids ranging in size from 5.9 cm in maximal diameter to less  than 1 cm.  She has been offered hysterectomy but was seeking a less invasive starting point for treatment of her uterine fibroids.  Given this embolization was recommended.  I have discussed the risks and benefits as well as alternative therapies.  I have discussed the procedure itself as well as the intra procedural and post procedural course.  All questions have been answered.  Patient is to see her OB/GYN this Friday and after a final discussion with her OB/GYN will decide on moving forward with embolization.  2. Fibroids, submucosal The patient has a history of painful vaginal bleeding with multiple documented fibroids.  Transvaginal ultrasound was performed 04/25/2021 which demonstrates at least 3 masses consistent with fibroids measuring up to 5.3 cm x 3.7 cm x 4.6 cm in size they appear to be submucosal.  I have personally reviewed the MRI.  The MRI of the pelvis with and without contrast was subsequently performed May 07, 2021 which showed 6-7 uterine fibroids  ranging in size from 5.9 cm in maximal diameter to less than 1 cm.  She has been offered hysterectomy but was seeking a less invasive starting point for treatment of her uterine fibroids.  Given this embolization was recommended.  I have discussed the risks and benefits as well as alternative therapies.  I have discussed the procedure itself as well as the intra procedural and post procedural course.  All questions have been answered.  Patient is to see her OB/GYN this Friday and after a final discussion with her OB/GYN will decide on moving forward with embolization.  3. Cardiomyopathy, nonischemic ([place]) Continue cardiac and antihypertensive medications as already ordered and reviewed, no changes at this time.  Continue statin as ordered and reviewed, no changes at this time  Nitrates PRN for chest pain     [name], MD  06/22/2021 3:10 PM

## 2021-06-23 ENCOUNTER — Ambulatory Visit (INDEPENDENT_AMBULATORY_CARE_PROVIDER_SITE_OTHER): Payer: Self-pay | Admitting: Vascular Surgery

## 2021-06-23 ENCOUNTER — Other Ambulatory Visit: Payer: Self-pay

## 2021-06-23 VITALS — BP 143/85 | HR 71 | Ht 67.0 in | Wt 308.0 lb

## 2021-06-23 DIAGNOSIS — I428 Other cardiomyopathies: Secondary | ICD-10-CM

## 2021-06-23 DIAGNOSIS — D250 Submucous leiomyoma of uterus: Secondary | ICD-10-CM

## 2021-06-23 DIAGNOSIS — N939 Abnormal uterine and vaginal bleeding, unspecified: Secondary | ICD-10-CM

## 2021-06-25 ENCOUNTER — Encounter (INDEPENDENT_AMBULATORY_CARE_PROVIDER_SITE_OTHER): Payer: Self-pay | Admitting: Vascular Surgery

## 2021-06-25 DIAGNOSIS — D250 Submucous leiomyoma of uterus: Secondary | ICD-10-CM | POA: Insufficient documentation

## 2021-06-25 DIAGNOSIS — N939 Abnormal uterine and vaginal bleeding, unspecified: Secondary | ICD-10-CM | POA: Insufficient documentation

## 2021-06-27 ENCOUNTER — Other Ambulatory Visit: Payer: Self-pay

## 2021-06-27 ENCOUNTER — Ambulatory Visit (INDEPENDENT_AMBULATORY_CARE_PROVIDER_SITE_OTHER): Payer: Self-pay | Admitting: Obstetrics and Gynecology

## 2021-06-27 ENCOUNTER — Encounter: Payer: Self-pay | Admitting: Obstetrics and Gynecology

## 2021-06-27 ENCOUNTER — Other Ambulatory Visit: Payer: Self-pay | Admitting: Obstetrics and Gynecology

## 2021-06-27 ENCOUNTER — Ambulatory Visit
Admission: RE | Admit: 2021-06-27 | Discharge: 2021-06-27 | Disposition: A | Discharge status: Home / Self Care | Payer: Medicaid Other | Source: Ambulatory Visit | Attending: Obstetrics and Gynecology | Admitting: Obstetrics and Gynecology

## 2021-06-27 VITALS — BP 134/72 | Ht 67.0 in | Wt 308.0 lb

## 2021-06-27 DIAGNOSIS — I509 Heart failure, unspecified: Secondary | ICD-10-CM

## 2021-06-27 DIAGNOSIS — M7989 Other specified soft tissue disorders: Secondary | ICD-10-CM

## 2021-06-27 DIAGNOSIS — N921 Excessive and frequent menstruation with irregular cycle: Secondary | ICD-10-CM

## 2021-06-27 DIAGNOSIS — N926 Irregular menstruation, unspecified: Secondary | ICD-10-CM

## 2021-06-27 MED ORDER — MEDROXYPROGESTERONE ACETATE 10 MG PO TABS
20.0000 mg | ORAL_TABLET | Freq: Three times a day (TID) | ORAL | 3 refills | Status: DC
  Filled 2021-06-27: qty 30, 5d supply, fill #0
  Filled 2021-07-17: qty 70, 12d supply, fill #0

## 2021-06-27 NOTE — Progress Notes (Signed)
Patient ID: Lauren Hensley, female   DOB: December 31, 1965, 55 y.o.   MRN: [id number]  Reason for Consult: Gynecologic Exam   Referred by Adrian Prows R, *  Subjective:     HPI:  Lauren Hensley is a 55 y.o. female she presents today for follow-up.  She was recently seen by vascular surgery.  She is interested in proceeding with her fibroid embolization to control her menorrhagia.  She is taking Provera 10 mg 3 times a day.  She reports that this has decreased her bleeding but not resolved it.  She generally changes a menstrual pad every hour.  Previously she was changing of menstrual pad every 30 minutes.  Gynecological History  Patient's last menstrual period was 03/03/2021.  Past Medical History:  Diagnosis Date   Anemia    Hypertension    Family History  Problem Relation Age of Onset   Diabetes Mother    COPD Mother    Heart Problems Mother    Emphysema Father    Hypercholesterolemia Brother    Diabetes Maternal Aunt    Obesity Maternal Aunt    Arthritis Maternal Aunt    Past Surgical History:  Procedure Laterality Date   COLONOSCOPY WITH PROPOFOL N/A 02/08/2021   Procedure: COLONOSCOPY WITH PROPOFOL;  Surgeon: Lin Landsman, MD;  Location: [facility] ENDOSCOPY;  Service: Gastroenterology;  Laterality: N/A;   ESOPHAGOGASTRODUODENOSCOPY N/A 02/08/2021   Procedure: ESOPHAGOGASTRODUODENOSCOPY (EGD);  Surgeon: Lin Landsman, MD;  Location: [facility] ENDOSCOPY;  Service: Gastroenterology;  Laterality: N/A;   GIVENS CAPSULE STUDY N/A 02/09/2021   Procedure: GIVENS CAPSULE STUDY;  Surgeon: Lesly Rubenstein, MD;  Location: [facility] ENDOSCOPY;  Service: Endoscopy;  Laterality: N/A;    Short Social History:  Social History   Tobacco Use   Smoking status: Never   Smokeless tobacco: Never  Substance Use Topics   Alcohol use: Yes    Comment: occ    No Known Allergies  Current Outpatient Medications  Medication Sig Dispense Refill   medroxyPROGESTERone (PROVERA) 10 MG  tablet Take 1 tablet (10 mg total) by mouth daily in the morning, at noon, and at bedtime. 90 tablet 0   metoprolol succinate (TOPROL-XL) 25 MG 24 hr tablet Take 1 tablet by mouth daily. (Patient not taking: Reported on 06/27/2021)     metroNIDAZOLE (FLAGYL) 500 MG tablet Take 2 tablets (1000mg  total) by mouth twice a day for one day.  (Or you can take all 4 tablets at once for one dose if you can tolerate it). (Patient not taking: Reported on 06/27/2021) 4 tablet 0   No current facility-administered medications for this visit.    Review of Systems  Constitutional: Negative for chills, fatigue, fever and unexpected weight change.  HENT: Negative for trouble swallowing.  Eyes: Negative for loss of vision.  Respiratory: Negative for cough, shortness of breath and wheezing.  Cardiovascular: Negative for chest pain, leg swelling, palpitations and syncope.  GI: Negative for abdominal pain, blood in stool, diarrhea, nausea and vomiting.  GU: Negative for difficulty urinating, dysuria, frequency and hematuria.  Musculoskeletal: Negative for back pain, leg pain and joint pain.  Skin: Negative for rash.  Neurological: Negative for dizziness, headaches, light-headedness, numbness and seizures.  Psychiatric: Negative for behavioral problem, confusion, depressed mood and sleep disturbance.       Objective:  Objective   Vitals:   06/27/21 1602  BP: 134/72  Weight: (!) 308 lb (139.7 kg)  Height: 5\' 7"  (1.702 m)   Body mass index is 48.24 kg/m.  Physical Exam Vitals and nursing note reviewed. Exam conducted with a chaperone present.  Constitutional:      Appearance: Normal appearance.  HENT:     Head: Normocephalic and atraumatic.  Eyes:     Extraocular Movements: Extraocular movements intact.     Pupils: Pupils are equal, round, and reactive to light.  Cardiovascular:     Rate and Rhythm: Normal rate and regular rhythm.  Pulmonary:     Effort: Pulmonary effort is normal.     Breath  sounds: Normal breath sounds.  Abdominal:     General: Abdomen is flat.     Palpations: Abdomen is soft.  Musculoskeletal:     Cervical back: Normal range of motion.  Skin:    General: Skin is warm and dry.  Neurological:     General: No focal deficit present.     Mental Status: She is alert and oriented to person, place, and time.  Psychiatric:        Behavior: Behavior normal.        Thought Content: Thought content normal.        Judgment: Judgment normal.    Assessment/Plan:    55 year old with enlarged fibroid uterus and secondary menorrhagia.   Menorrhagia secondary to enlarged fibroid uterus.  Has had prior endometrial biopsy which has been normal. Recommended increasing Provera dose to 20 mg 3 times a day.Patient will plan on calling the vascular surgery office on Monday to schedule fibroid ablation.  Patient continues to follow-up with hematology regarding her anemia.  She has not yet experienced significant improvement, this is likely related to her ongoing bleeding. 2. Right right lower extremity swelling .  Stat right lower extremity ultrasound order placed.  Patient was sent to the medical mall to complete imaging.  3.  Patient has not followed up with cardiology regarding her CHF.  Referral placed so that she can do so.  More than 20 minutes were spent face to face with the patient in the room, reviewing the medical record, labs and images, and coordinating care for the patient. The plan of management was discussed in detail and counseling was provided.       Adrian Prows MD [facility], [facility] 06/27/2021 4:17 PM

## 2021-06-30 ENCOUNTER — Other Ambulatory Visit: Payer: Self-pay

## 2021-07-03 ENCOUNTER — Inpatient Hospital Stay: Payer: Self-pay

## 2021-07-03 ENCOUNTER — Other Ambulatory Visit: Payer: Self-pay | Admitting: Nurse Practitioner

## 2021-07-03 DIAGNOSIS — D509 Iron deficiency anemia, unspecified: Secondary | ICD-10-CM

## 2021-07-07 ENCOUNTER — Encounter: Payer: Self-pay | Admitting: Internal Medicine

## 2021-07-07 ENCOUNTER — Inpatient Hospital Stay (HOSPITAL_BASED_OUTPATIENT_CLINIC_OR_DEPARTMENT_OTHER): Payer: Medicaid Other | Admitting: Internal Medicine

## 2021-07-07 ENCOUNTER — Inpatient Hospital Stay: Payer: Self-pay

## 2021-07-07 ENCOUNTER — Inpatient Hospital Stay: Payer: Self-pay | Attending: Nurse Practitioner | Admitting: Nurse Practitioner

## 2021-07-07 ENCOUNTER — Other Ambulatory Visit: Payer: Self-pay

## 2021-07-07 VITALS — BP 139/83 | HR 56 | Temp 97.6°F | Resp 20 | Ht 67.0 in | Wt 307.0 lb

## 2021-07-07 VITALS — BP 146/80 | HR 58 | Resp 20

## 2021-07-07 DIAGNOSIS — Z836 Family history of other diseases of the respiratory system: Secondary | ICD-10-CM | POA: Insufficient documentation

## 2021-07-07 DIAGNOSIS — D563 Thalassemia minor: Secondary | ICD-10-CM | POA: Insufficient documentation

## 2021-07-07 DIAGNOSIS — D500 Iron deficiency anemia secondary to blood loss (chronic): Secondary | ICD-10-CM

## 2021-07-07 DIAGNOSIS — Z833 Family history of diabetes mellitus: Secondary | ICD-10-CM | POA: Insufficient documentation

## 2021-07-07 DIAGNOSIS — Z8261 Family history of arthritis: Secondary | ICD-10-CM | POA: Insufficient documentation

## 2021-07-07 DIAGNOSIS — D509 Iron deficiency anemia, unspecified: Secondary | ICD-10-CM

## 2021-07-07 DIAGNOSIS — Z79899 Other long term (current) drug therapy: Secondary | ICD-10-CM | POA: Insufficient documentation

## 2021-07-07 DIAGNOSIS — R5383 Other fatigue: Secondary | ICD-10-CM | POA: Insufficient documentation

## 2021-07-07 DIAGNOSIS — M2550 Pain in unspecified joint: Secondary | ICD-10-CM | POA: Insufficient documentation

## 2021-07-07 DIAGNOSIS — N920 Excessive and frequent menstruation with regular cycle: Secondary | ICD-10-CM

## 2021-07-07 DIAGNOSIS — Z8349 Family history of other endocrine, nutritional and metabolic diseases: Secondary | ICD-10-CM | POA: Insufficient documentation

## 2021-07-07 DIAGNOSIS — R42 Dizziness and giddiness: Secondary | ICD-10-CM | POA: Insufficient documentation

## 2021-07-07 LAB — CBC WITH DIFFERENTIAL/PLATELET
Abs Immature Granulocytes: 0 10*3/uL (ref 0.00–0.07)
Basophils Absolute: 0.1 10*3/uL (ref 0.0–0.1)
Basophils Relative: 2 %
Eosinophils Absolute: 0.1 10*3/uL (ref 0.0–0.5)
Eosinophils Relative: 2 %
HCT: 24.7 % — ABNORMAL LOW (ref 36.0–46.0)
Hemoglobin: 7.5 g/dL — ABNORMAL LOW (ref 12.0–15.0)
Immature Granulocytes: 0 %
Lymphocytes Relative: 46 %
Lymphs Abs: 1.9 10*3/uL (ref 0.7–4.0)
MCH: 21.7 pg — ABNORMAL LOW (ref 26.0–34.0)
MCHC: 30.4 g/dL (ref 30.0–36.0)
MCV: 71.6 fL — ABNORMAL LOW (ref 80.0–100.0)
Monocytes Absolute: 0.3 10*3/uL (ref 0.1–1.0)
Monocytes Relative: 8 %
Neutro Abs: 1.7 10*3/uL (ref 1.7–7.7)
Neutrophils Relative %: 42 %
Platelets: 263 10*3/uL (ref 150–400)
RBC: 3.45 MIL/uL — ABNORMAL LOW (ref 3.87–5.11)
RDW: 16.3 % — ABNORMAL HIGH (ref 11.5–15.5)
WBC: 3.9 10*3/uL — ABNORMAL LOW (ref 4.0–10.5)
nRBC: 0 % (ref 0.0–0.2)

## 2021-07-07 LAB — FERRITIN: Ferritin: 8 ng/mL — ABNORMAL LOW (ref 11–307)

## 2021-07-07 LAB — IRON AND TIBC
Iron: 24 ug/dL — ABNORMAL LOW (ref 28–170)
Saturation Ratios: 6 % — ABNORMAL LOW (ref 10.4–31.8)
TIBC: 405 ug/dL (ref 250–450)
UIBC: 381 ug/dL

## 2021-07-07 MED ORDER — SODIUM CHLORIDE 0.9 % IV SOLN: 200.0000 mg | Freq: Once | INTRAVENOUS | Status: DC

## 2021-07-07 MED ORDER — SODIUM CHLORIDE 0.9 % IV SOLN
Freq: Once | INTRAVENOUS | Status: AC
  Filled 2021-07-07: qty 250

## 2021-07-07 MED ORDER — IRON SUCROSE 20 MG/ML IV SOLN
200.0000 mg | Freq: Once | INTRAVENOUS | Status: AC
  Administered 2021-07-07: 200 mg via INTRAVENOUS
  Filled 2021-07-07: qty 10

## 2021-07-07 NOTE — Progress Notes (Signed)
Pt tolerated venofer infusion well today with no problems or complaints. Pt left infusion suite stable and ambulatory.  

## 2021-07-07 NOTE — Assessment & Plan Note (Addendum)
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  Hemoglobin today 7.5.  Proceed with IV Venofer weekly x4.    #Menorrhagia-fibroids s/p gynecology evaluation.  Awaiting IR evaluation for embolization.  # DISPOSITION # venofer today # weekly venofer x4/ :needs transportation # follow up in 4 weeks- NP; labs- cbc; iron studies/ferritin-venofer- Dr.B

## 2021-07-07 NOTE — Progress Notes (Signed)
[facility] OFFICE PROGRESS NOTE  Patient Care Team: Homero Fellers, MD as PCP - General (Obstetrics and Gynecology) Rico Junker, RN as Registered Nurse Theodore Demark, RN as Registered Nurse  Cancer Staging No matching staging information was found for the patient.   Oncology History   No history exists.   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   #Heavy menstrual cycles-fibroids [Dr Shuman]   INTERVAL HISTORY: Ambulating independently.  Lauren Hensley 54 y.o.  female pleasant patient history of iron deficient anemia/beta thalassemia-other history of fibroids/heavy menstrual cycle is here for follow-up.  Patient interim has been evaluated by gynecology; Lauren Hensley has been referred to IR for embolization/Dr.Schnier.   Last iron infusion approximately 2 months ago.  Patient feels tired.  Dizzy.  Review of Systems  Constitutional:  Positive for malaise/fatigue. Negative for chills, diaphoresis, fever and weight loss.  HENT:  Negative for nosebleeds and sore throat.   Eyes:  Negative for double vision.  Respiratory:  Negative for cough, hemoptysis, sputum production, shortness of breath and wheezing.   Cardiovascular:  Negative for chest pain, palpitations, orthopnea and leg swelling.  Gastrointestinal:  Negative for abdominal pain, blood in stool, constipation, diarrhea, heartburn, melena, nausea and vomiting.  Genitourinary:  Negative for dysuria, frequency and urgency.  Musculoskeletal:  Positive for joint pain. Negative for back pain.  Skin: Negative.  Negative for itching and rash.  Neurological:  Positive for dizziness. Negative for tingling, focal weakness, weakness and headaches.  Endo/Heme/Allergies:  Does not bruise/bleed easily.  Psychiatric/Behavioral:  Negative for depression. The patient is not nervous/anxious and does not have insomnia.      PAST MEDICAL HISTORY :   Past Medical History:  Diagnosis Date   Anemia    Hypertension     PAST SURGICAL HISTORY :   Past Surgical History:  Procedure Laterality Date   COLONOSCOPY WITH PROPOFOL N/A 02/08/2021   Procedure: COLONOSCOPY WITH PROPOFOL;  Surgeon: Lin Landsman, MD;  Location: [facility] ENDOSCOPY;  Service: Gastroenterology;  Laterality: N/A;   ESOPHAGOGASTRODUODENOSCOPY N/A 02/08/2021   Procedure: ESOPHAGOGASTRODUODENOSCOPY (EGD);  Surgeon: Lin Landsman, MD;  Location: [facility] ENDOSCOPY;  Service: Gastroenterology;  Laterality: N/A;   GIVENS CAPSULE STUDY N/A 02/09/2021   Procedure: GIVENS CAPSULE STUDY;  Surgeon: Lesly Rubenstein, MD;  Location: [facility] ENDOSCOPY;  Service: Endoscopy;  Laterality: N/A;    FAMILY HISTORY :   Family History  Problem Relation Age of Onset   Diabetes Mother    COPD Mother    Heart Problems Mother    Emphysema Father    Hypercholesterolemia Brother    Diabetes Maternal Aunt    Obesity Maternal Aunt    Arthritis Maternal Aunt     SOCIAL HISTORY:   Social History   Tobacco Use   Smoking status: Never   Smokeless tobacco: Never  Vaping Use   Vaping Use: Never used  Substance Use Topics   Alcohol use: Yes    Comment: occ   Drug use: Not Currently    Types: Marijuana, Cocaine    ALLERGIES:  has No Known Allergies.  MEDICATIONS:  Current Outpatient Medications  Medication Sig Dispense Refill   medroxyPROGESTERone (PROVERA) 10 MG tablet Take 2 tablets (20 mg total) by mouth 3 (three) times daily. 30 tablet 3   No current facility-administered medications for this visit.   Facility-Administered Medications Ordered in Other Visits  Medication Dose Route Frequency Provider Last Rate Last Admin   iron  sucrose (VENOFER) injection 200 mg  200 mg Intravenous Once Charlaine Dalton R, MD        PHYSICAL EXAMINATION:  BP 139/83   Pulse (!) 56   Temp 97.6 F (36.4 C) (Tympanic)   Resp 20   Ht 5\' 7"  (1.702 m)   Wt (!) 307 lb (139.3 kg)   LMP  03/03/2021   BMI 48.08 kg/m   Filed Weights   07/07/21 1256  Weight: (!) 307 lb (139.3 kg)    Physical Exam Vitals and nursing note reviewed.  HENT:     Head: Normocephalic and atraumatic.     Mouth/Throat:     Pharynx: Oropharynx is clear.  Eyes:     Extraocular Movements: Extraocular movements intact.     Pupils: Pupils are equal, round, and reactive to light.  Cardiovascular:     Rate and Rhythm: Normal rate and regular rhythm.  Pulmonary:     Comments: Decreased breath sounds bilaterally.  Abdominal:     Palpations: Abdomen is soft.  Musculoskeletal:        General: Normal range of motion.     Cervical back: Normal range of motion.  Skin:    General: Skin is warm.  Neurological:     General: No focal deficit present.     Mental Status: Lauren Hensley is alert and oriented to person, place, and time.  Psychiatric:        Behavior: Behavior normal.        Judgment: Judgment normal.       LABORATORY DATA:  I have reviewed the data as listed    Component Value Date/Time   NA 136 03/13/2021 1519   K 4.0 03/13/2021 1519   CL 105 03/13/2021 1519   CO2 25 03/13/2021 1519   GLUCOSE 82 03/13/2021 1519   BUN 15 03/13/2021 1519   CREATININE 0.75 03/13/2021 1519   CALCIUM 8.9 03/13/2021 1519   PROT 7.3 03/13/2021 1519   ALBUMIN 3.9 03/13/2021 1519   AST 15 03/13/2021 1519   ALT 11 03/13/2021 1519   ALKPHOS 33 (L) 03/13/2021 1519   BILITOT 0.9 03/13/2021 1519   GFRNONAA >60 03/13/2021 1519    No results found for: SPEP, UPEP  Lab Results  Component Value Date   WBC 3.9 (L) 07/07/2021   NEUTROABS 1.7 07/07/2021   HGB 7.5 (L) 07/07/2021   HCT 24.7 (L) 07/07/2021   MCV 71.6 (L) 07/07/2021   PLT 263 07/07/2021      Chemistry      Component Value Date/Time   NA 136 03/13/2021 1519   K 4.0 03/13/2021 1519   CL 105 03/13/2021 1519   CO2 25 03/13/2021 1519   BUN 15 03/13/2021 1519   CREATININE 0.75 03/13/2021 1519      Component Value Date/Time   CALCIUM 8.9  03/13/2021 1519   ALKPHOS 33 (L) 03/13/2021 1519   AST 15 03/13/2021 1519   ALT 11 03/13/2021 1519   BILITOT 0.9 03/13/2021 1519       RADIOGRAPHIC STUDIES: I have personally reviewed the radiological images as listed and agreed with the findings in the report. No results found.   ASSESSMENT & PLAN:  Iron deficiency anemia due to chronic blood loss Assessment:  Lauren Hensley is a 54 y.o. female who returns to clinic for evaluation of iron deficiency anemia.    Iron Deficiency Anemia- secondary to heavy bleeding related to fibroids and previous h pylori infection. Previous EGD, capsule, and colonoscopy with GI. Awaiting possible [place] for  management. On provera per gyn to lighten periods. Labs reviewed and consistent with iron deficiency. Lauren Hensley has now received venofer 200 mg x 5. Hemoglobin is stable at 8.8 post iron. Iron studies today pending. Discussed with Dr. Rogue Bussing who recommends venofer every other week and to return to clinic for labs and re-evaluation in 8 weeks.   Symptomatic anemia- hemoglobin 8.8, microcytic. Goal hemoglobin > 8. If hemoglobin < 8, plan for transfusion.    Beta thalassemia minor- Hgb A decreased, Hgb A2 elevated. Findings suspicious for beta thalassemia minor. Dorrect iron deficiency and re-evaluate if symptomatic or microcytosis persists.   Social determinants of health- homelessness. Currently living with family. We will continue to arrange transportation for her to and from appts.   # DISPOSITION # venofer today # weekly venofer x4/ :needs transportation # follow up in 4 weeks- NP; labs- cbc; iron studies/ferritin-venofer- Dr.B     Orders Placed This Encounter  Procedures   CBC    Standing Status:   Future    Standing Expiration Date:   07/07/2022   Iron and TIBC    Standing Status:   Future    Standing Expiration Date:   07/07/2022   Ferritin    Standing Status:   Future    Standing Expiration Date:   07/07/2022   All questions were  answered. The patient knows to call the clinic with any problems, questions or concerns.      Cammie Sickle, MD 07/07/2021 1:53 PM

## 2021-07-07 NOTE — Patient Instructions (Signed)

## 2021-07-14 ENCOUNTER — Inpatient Hospital Stay: Payer: Self-pay

## 2021-07-14 ENCOUNTER — Telehealth (INDEPENDENT_AMBULATORY_CARE_PROVIDER_SITE_OTHER): Payer: Self-pay | Admitting: Vascular Surgery

## 2021-07-14 ENCOUNTER — Other Ambulatory Visit: Payer: Self-pay

## 2021-07-14 VITALS — BP 137/73 | HR 53 | Temp 96.0°F | Resp 19

## 2021-07-14 DIAGNOSIS — D500 Iron deficiency anemia secondary to blood loss (chronic): Secondary | ICD-10-CM

## 2021-07-14 MED ORDER — SODIUM CHLORIDE 0.9 % IV SOLN
Freq: Once | INTRAVENOUS | Status: AC
  Filled 2021-07-14: qty 250

## 2021-07-14 MED ORDER — IRON SUCROSE 20 MG/ML IV SOLN
200.0000 mg | Freq: Once | INTRAVENOUS | Status: AC
  Administered 2021-07-14: 200 mg via INTRAVENOUS
  Filled 2021-07-14: qty 10

## 2021-07-14 MED ORDER — SODIUM CHLORIDE 0.9 % IV SOLN: 200.0000 mg | Freq: Once | INTRAVENOUS | Status: DC

## 2021-07-14 NOTE — Patient Instructions (Signed)

## 2021-07-15 ENCOUNTER — Telehealth (INDEPENDENT_AMBULATORY_CARE_PROVIDER_SITE_OTHER): Payer: Self-pay

## 2021-07-15 NOTE — Telephone Encounter (Signed)
Patient returned my call and is schedule with Dr. Delana Meyer for a UFE on 07/29/21 with a 6:45 am arrival time to the [facility]. Pre-procedure instructions were discussed and will be mailed.

## 2021-07-15 NOTE — Telephone Encounter (Signed)
I attempted to contact the patient regarding being scheduled for a Uterine embolization and a message was left for a return call.

## 2021-07-16 NOTE — Telephone Encounter (Signed)
Patient is scheduled   

## 2021-07-17 ENCOUNTER — Encounter: Payer: Self-pay | Admitting: Nurse Practitioner

## 2021-07-17 ENCOUNTER — Other Ambulatory Visit: Payer: Self-pay

## 2021-07-18 ENCOUNTER — Encounter: Payer: Self-pay | Admitting: Nurse Practitioner

## 2021-07-21 ENCOUNTER — Inpatient Hospital Stay: Payer: Self-pay

## 2021-07-21 ENCOUNTER — Other Ambulatory Visit: Payer: Self-pay

## 2021-07-21 NOTE — Progress Notes (Signed)
Pt scheduled for Venofer infusion today.Three nurses attempted to gain IV access and were unsuccessful. MD notified. Per MD to scheduled pt later this week to proceed with Venofer infusion and keep other appointments as scheduled. Pt and treatment team updated. Pt will be notified with new venofer appointment for this week. All questions answered at this time. Pt stable for discharge.  Lauren Hensley [employer]

## 2021-07-24 ENCOUNTER — Other Ambulatory Visit: Payer: Self-pay

## 2021-07-24 ENCOUNTER — Inpatient Hospital Stay: Payer: Self-pay

## 2021-07-24 ENCOUNTER — Other Ambulatory Visit: Payer: Self-pay | Admitting: Internal Medicine

## 2021-07-24 VITALS — BP 134/75 | HR 57 | Temp 96.9°F | Resp 16

## 2021-07-24 DIAGNOSIS — D500 Iron deficiency anemia secondary to blood loss (chronic): Secondary | ICD-10-CM

## 2021-07-24 MED ORDER — SODIUM CHLORIDE 0.9 % IV SOLN
Freq: Once | INTRAVENOUS | Status: AC
  Filled 2021-07-24: qty 250

## 2021-07-24 MED ORDER — SODIUM CHLORIDE 0.9 % IV SOLN: 200.0000 mg | Freq: Once | INTRAVENOUS | Status: DC

## 2021-07-24 MED ORDER — IRON SUCROSE 20 MG/ML IV SOLN
200.0000 mg | Freq: Once | INTRAVENOUS | Status: AC
  Administered 2021-07-24: 200 mg via INTRAVENOUS
  Filled 2021-07-24: qty 10

## 2021-07-24 NOTE — Patient Instructions (Signed)

## 2021-07-28 ENCOUNTER — Other Ambulatory Visit: Payer: Self-pay

## 2021-07-28 ENCOUNTER — Encounter: Payer: Self-pay | Admitting: Internal Medicine

## 2021-07-28 ENCOUNTER — Inpatient Hospital Stay: Payer: Self-pay

## 2021-07-28 VITALS — BP 135/85 | HR 61 | Temp 96.1°F | Resp 17

## 2021-07-28 DIAGNOSIS — D500 Iron deficiency anemia secondary to blood loss (chronic): Secondary | ICD-10-CM

## 2021-07-28 MED ORDER — SODIUM CHLORIDE 0.9 % IV SOLN
Freq: Once | INTRAVENOUS | Status: AC
  Filled 2021-07-28: qty 250

## 2021-07-28 MED ORDER — IRON SUCROSE 20 MG/ML IV SOLN
200.0000 mg | Freq: Once | INTRAVENOUS | Status: AC
  Administered 2021-07-28: 200 mg via INTRAVENOUS
  Filled 2021-07-28: qty 10

## 2021-07-28 MED ORDER — SODIUM CHLORIDE 0.9 % IV SOLN: 200.0000 mg | Freq: Once | INTRAVENOUS | Status: DC

## 2021-07-28 NOTE — Progress Notes (Signed)
Duplicate entry. Disregard.

## 2021-07-28 NOTE — Patient Instructions (Signed)

## 2021-07-29 ENCOUNTER — Encounter: Payer: Self-pay | Admitting: Vascular Surgery

## 2021-07-29 ENCOUNTER — Other Ambulatory Visit: Payer: Self-pay

## 2021-07-29 ENCOUNTER — Inpatient Hospital Stay
Admission: RE | Admit: 2021-07-29 | Discharge: 2021-07-31 | DRG: 750 | Disposition: A | Discharge status: Home / Self Care | Payer: Medicaid Other | Source: Ambulatory Visit | Attending: Vascular Surgery | Admitting: Vascular Surgery

## 2021-07-29 ENCOUNTER — Other Ambulatory Visit (INDEPENDENT_AMBULATORY_CARE_PROVIDER_SITE_OTHER): Payer: Self-pay | Admitting: Nurse Practitioner

## 2021-07-29 ENCOUNTER — Encounter
Admission: RE | Disposition: A | Discharge status: Home / Self Care | Payer: Self-pay | Source: Ambulatory Visit | Attending: Vascular Surgery

## 2021-07-29 DIAGNOSIS — N926 Irregular menstruation, unspecified: Secondary | ICD-10-CM

## 2021-07-29 DIAGNOSIS — Z83438 Family history of other disorder of lipoprotein metabolism and other lipidemia: Secondary | ICD-10-CM

## 2021-07-29 DIAGNOSIS — Z23 Encounter for immunization: Secondary | ICD-10-CM

## 2021-07-29 DIAGNOSIS — D259 Leiomyoma of uterus, unspecified: Secondary | ICD-10-CM

## 2021-07-29 DIAGNOSIS — Z833 Family history of diabetes mellitus: Secondary | ICD-10-CM

## 2021-07-29 DIAGNOSIS — D500 Iron deficiency anemia secondary to blood loss (chronic): Secondary | ICD-10-CM | POA: Diagnosis present

## 2021-07-29 DIAGNOSIS — D250 Submucous leiomyoma of uterus: Secondary | ICD-10-CM

## 2021-07-29 DIAGNOSIS — N921 Excessive and frequent menstruation with irregular cycle: Secondary | ICD-10-CM | POA: Diagnosis present

## 2021-07-29 DIAGNOSIS — N939 Abnormal uterine and vaginal bleeding, unspecified: Secondary | ICD-10-CM

## 2021-07-29 DIAGNOSIS — R109 Unspecified abdominal pain: Secondary | ICD-10-CM | POA: Diagnosis present

## 2021-07-29 DIAGNOSIS — Z825 Family history of asthma and other chronic lower respiratory diseases: Secondary | ICD-10-CM

## 2021-07-29 DIAGNOSIS — Z9889 Other specified postprocedural states: Secondary | ICD-10-CM

## 2021-07-29 HISTORY — PX: EMBOLIZATION (CATH LAB): CATH118239

## 2021-07-29 LAB — CREATININE, SERUM
Creatinine, Ser: 0.9 mg/dL (ref 0.44–1.00)
GFR, Estimated: >60 mL/min (ref >60)

## 2021-07-29 LAB — BUN: BUN: 6 mg/dL (ref 6–20)

## 2021-07-29 SURGERY — EMBOLIZATION
Anesthesia: Moderate Sedation

## 2021-07-29 MED ORDER — OXYCODONE HCL 5 MG PO TABS
5.0000 mg | ORAL_TABLET | ORAL | Status: DC | PRN
  Administered 2021-07-29 (×2): 5 mg via ORAL
  Administered 2021-07-30: 10 mg via ORAL
  Filled 2021-07-29: qty 1
  Filled 2021-07-29: qty 2
  Filled 2021-07-29: qty 1

## 2021-07-29 MED ORDER — IODIXANOL 320 MG/ML IV SOLN
INTRAVENOUS | Status: DC | PRN
  Administered 2021-07-29: 105 mL via INTRA_ARTERIAL

## 2021-07-29 MED ORDER — ACETAMINOPHEN 325 MG PO TABS: 650.0000 mg | ORAL_TABLET | ORAL | Status: DC | PRN

## 2021-07-29 MED ORDER — FENTANYL CITRATE PF 50 MCG/ML IJ SOSY
PREFILLED_SYRINGE | INTRAMUSCULAR | Status: AC
  Filled 2021-07-29: qty 1

## 2021-07-29 MED ORDER — CEFAZOLIN SODIUM-DEXTROSE 2-4 GM/100ML-% IV SOLN
INTRAVENOUS | Status: AC
  Administered 2021-07-29: 2 g via INTRAVENOUS
  Filled 2021-07-29: qty 100

## 2021-07-29 MED ORDER — SODIUM CHLORIDE 0.9% FLUSH: 3.0000 mL | INTRAVENOUS | Status: DC | PRN

## 2021-07-29 MED ORDER — SODIUM CHLORIDE 0.9% FLUSH
3.0000 mL | Freq: Two times a day (BID) | INTRAVENOUS | Status: DC
  Administered 2021-07-30 (×2): 3 mL via INTRAVENOUS

## 2021-07-29 MED ORDER — ONDANSETRON HCL 4 MG/2ML IJ SOLN: 4.0000 mg | Freq: Four times a day (QID) | INTRAMUSCULAR | Status: DC | PRN

## 2021-07-29 MED ORDER — METHYLPREDNISOLONE SODIUM SUCC 125 MG IJ SOLR: 125.0000 mg | Freq: Once | INTRAMUSCULAR | Status: DC | PRN

## 2021-07-29 MED ORDER — MIDAZOLAM HCL 2 MG/2ML IJ SOLN
INTRAMUSCULAR | Status: AC
  Filled 2021-07-29: qty 2

## 2021-07-29 MED ORDER — FENTANYL CITRATE (PF) 100 MCG/2ML IJ SOLN
INTRAMUSCULAR | Status: DC | PRN
  Administered 2021-07-29: 50 ug via INTRAVENOUS
  Administered 2021-07-29 (×3): 25 ug via INTRAVENOUS
  Administered 2021-07-29: 50 ug via INTRAVENOUS

## 2021-07-29 MED ORDER — FAMOTIDINE 20 MG PO TABS: 40.0000 mg | ORAL_TABLET | Freq: Once | ORAL | Status: DC | PRN

## 2021-07-29 MED ORDER — HYDROMORPHONE HCL 1 MG/ML IJ SOLN
1.0000 mg | Freq: Once | INTRAMUSCULAR | Status: AC | PRN
  Administered 2021-07-29: 1 mg via INTRAVENOUS

## 2021-07-29 MED ORDER — MIDAZOLAM HCL 2 MG/ML PO SYRP: 8.0000 mg | ORAL_SOLUTION | Freq: Once | ORAL | Status: DC | PRN

## 2021-07-29 MED ORDER — MEDROXYPROGESTERONE ACETATE 10 MG PO TABS
20.0000 mg | ORAL_TABLET | Freq: Three times a day (TID) | ORAL | Status: DC
  Administered 2021-07-29 – 2021-07-31 (×6): 20 mg via ORAL
  Filled 2021-07-29 (×10): qty 2

## 2021-07-29 MED ORDER — OXYCODONE HCL 5 MG PO TABS: 5.0000 mg | ORAL_TABLET | ORAL | Status: DC | PRN

## 2021-07-29 MED ORDER — SODIUM CHLORIDE 0.9 % IV SOLN: 250.0000 mL | INTRAVENOUS | Status: DC | PRN

## 2021-07-29 MED ORDER — HYDROMORPHONE HCL 1 MG/ML IJ SOLN: 0.5000 mg | INTRAMUSCULAR | Status: DC | PRN

## 2021-07-29 MED ORDER — SODIUM CHLORIDE 0.9 % IV SOLN
25.0000 mg | Freq: Once | INTRAVENOUS | Status: AC
  Administered 2021-07-29: 25 mg via INTRAVENOUS
  Filled 2021-07-29: qty 1

## 2021-07-29 MED ORDER — DIPHENHYDRAMINE HCL 50 MG/ML IJ SOLN: 50.0000 mg | Freq: Once | INTRAMUSCULAR | Status: DC | PRN

## 2021-07-29 MED ORDER — MORPHINE SULFATE (PF) 4 MG/ML IV SOLN
2.0000 mg | INTRAVENOUS | Status: DC | PRN
Start: 2021-07-29 — End: 2021-07-29

## 2021-07-29 MED ORDER — HEPARIN SODIUM (PORCINE) 1000 UNIT/ML IJ SOLN
INTRAMUSCULAR | Status: DC | PRN
  Administered 2021-07-29: 2000 [IU] via INTRAVENOUS

## 2021-07-29 MED ORDER — CEFAZOLIN SODIUM-DEXTROSE 2-4 GM/100ML-% IV SOLN: 2.0000 g | Freq: Once | INTRAVENOUS | Status: AC

## 2021-07-29 MED ORDER — SODIUM CHLORIDE 0.9 % IV SOLN: INTRAVENOUS | Status: AC

## 2021-07-29 MED ORDER — PROMETHAZINE HCL 25 MG/ML IJ SOLN
INTRAMUSCULAR | Status: AC
  Filled 2021-07-29: qty 1

## 2021-07-29 MED ORDER — HYDRALAZINE HCL 20 MG/ML IJ SOLN: 5.0000 mg | INTRAMUSCULAR | Status: DC | PRN

## 2021-07-29 MED ORDER — SODIUM CHLORIDE 0.9 % IV SOLN: INTRAVENOUS | Status: DC

## 2021-07-29 MED ORDER — SODIUM CHLORIDE 0.9% FLUSH: 3.0000 mL | Freq: Two times a day (BID) | INTRAVENOUS | Status: DC

## 2021-07-29 MED ORDER — LABETALOL HCL 5 MG/ML IV SOLN: 10.0000 mg | INTRAVENOUS | Status: DC | PRN

## 2021-07-29 MED ORDER — HYDROMORPHONE HCL 1 MG/ML IJ SOLN
INTRAMUSCULAR | Status: AC
  Filled 2021-07-29: qty 1

## 2021-07-29 MED ORDER — HEPARIN SODIUM (PORCINE) 1000 UNIT/ML IJ SOLN
INTRAMUSCULAR | Status: AC
  Filled 2021-07-29: qty 1

## 2021-07-29 MED ORDER — ONDANSETRON HCL 4 MG/2ML IJ SOLN
INTRAMUSCULAR | Status: AC
  Administered 2021-07-29: 4 mg via INTRAVENOUS
  Filled 2021-07-29: qty 2

## 2021-07-29 MED ORDER — MIDAZOLAM HCL 2 MG/2ML IJ SOLN
INTRAMUSCULAR | Status: DC | PRN
  Administered 2021-07-29: 2 mg via INTRAVENOUS
  Administered 2021-07-29: 1 mg via INTRAVENOUS
  Administered 2021-07-29 (×2): .5 mg via INTRAVENOUS
  Administered 2021-07-29: 1 mg via INTRAVENOUS

## 2021-07-29 SURGICAL SUPPLY — 29 items
BLOCK BEAD 500-700 (Vascular Products) ×2 IMPLANT
BLOCK BEAD 700-900 (Vascular Products) ×2 IMPLANT
CANNULA 5F STIFF (CANNULA) ×2 IMPLANT
CATH ANGIO 5F PIGTAIL 65CM (CATHETERS) ×2 IMPLANT
CATH BEACON 5 .035 40 KMP TP (CATHETERS) ×1 IMPLANT
CATH BEACON 5 .035 65 C2 TIP (CATHETERS) ×2 IMPLANT
CATH BEACON 5 .038 40 KMP TP (CATHETERS) ×1
CATH KUMPE SOFT-VU 5FR 65 (CATHETERS) ×2 IMPLANT
CATH LANTERN 025 150CM 90TIP (MICROCATHETER) ×2 IMPLANT
CATH MICROCATH PRGRT 2.8F 110 (CATHETERS) ×1 IMPLANT
CATH TEMPO 5F RIM 65CM (CATHETERS) ×2 IMPLANT
COIL 400 COMPLEX SOFT 3X15CM (Vascular Products) ×2 IMPLANT
COIL 400 COMPLEX SOFT 4X15CM (Vascular Products) ×4 IMPLANT
COVER PROBE U/S 5X48 (MISCELLANEOUS) ×2 IMPLANT
DEVICE STARCLOSE SE CLOSURE (Vascular Products) ×2 IMPLANT
DEVICE TORQUE .025-.038 (MISCELLANEOUS) ×2 IMPLANT
GLIDEWIRE ADV .035X180CM (WIRE) ×2 IMPLANT
GLIDEWIRE STIFF .35X180X3 HYDR (WIRE) ×2 IMPLANT
GUIDEWIRE ADV .018X180CM (WIRE) ×2 IMPLANT
GUIDEWIRE ANGLED .035 180CM (WIRE) ×2 IMPLANT
HANDLE DETACHMENT COIL (MISCELLANEOUS) ×2 IMPLANT
MICROCATH PROGREAT 2.8F 110 CM (CATHETERS) ×2
PACK ANGIOGRAPHY (CUSTOM PROCEDURE TRAY) ×2 IMPLANT
SHEATH BALKIN 6FR (SHEATH) ×2 IMPLANT
SHEATH BRITE TIP 5FRX11 (SHEATH) ×2 IMPLANT
SYR MEDRAD MARK 7 150ML (SYRINGE) ×2 IMPLANT
TUBING CONTRAST HIGH PRESS 72 (TUBING) ×4 IMPLANT
WIRE G 018X200 V18 (WIRE) ×2 IMPLANT
WIRE GUIDERIGHT .035X150 (WIRE) ×2 IMPLANT

## 2021-07-29 NOTE — Op Note (Signed)
[facility]  Percutaneous Study/Intervention Procedural Note   Date of Surgery: 07/29/2021,10:51 AM  Surgeon:Tashon Capp, Dolores Lory   Pre-operative Diagnosis: Mental menorrhagia; anemia of blood loss secondary to uterine fibroids  Post-operative diagnosis:  Same  Procedure(s) Performed:  1.  Abdominal aortogram  2.  Selective injection of the right hypogastric artery and uterine artery third order catheter placement  3.  Selective injection of the left hypogastric artery and left uterine artery third order catheter placement  4.  Embolization of the right uterine artery using a combination of PVC beads with Ruby coils  5.  StarClose right common femoral artery   Anesthesia: Conscious sedation was administered by the interventional radiology RN under my direct supervision. IV Versed plus fentanyl were utilized. Continuous ECG, pulse oximetry and blood pressure was monitored throughout the entire procedure. Conscious sedation was administered for a total of 1 hour 45 minutes.  Sheath: Right uterine artery embolization performed through a 5 French 11 cm Pinnacle sheath; left hypogastric angiography via a 6 [place] Balkan sheath  Contrast: 105 cc   Fluoroscopy Time: 29.6 minutes  Indications: Patient has profound anemia secondary to menometrorrhagia from her uterine fibroids.  She has been offered hysterectomy as well as embolization.  She is elected proceed with embolization risks and benefits of been reviewed all questions answered patient agrees to proceed.  Procedure:  Lauren Hensley a 55 y.o. female who was identified and appropriate procedural time out was performed.  The patient was then placed supine on the table and prepped and draped in the usual sterile fashion.  Ultrasound was used to evaluate the right common femoral artery.  It was echolucent and pulsatile indicating it is patent .  An ultrasound image was acquired for the permanent record.  A micropuncture  needle was used to access the right common femoral artery under direct ultrasound guidance.  The microwire was then advanced under fluoroscopic guidance without difficulty followed by the micro-sheath.  A 0.035 J wire was advanced without resistance and a 5Fr sheath was placed.    Pigtail catheter is advanced up a J-wire and positioned in the distal aorta and AP projection of the aortic bifurcation and iliac arteries is obtained.  Stiff angled Glidewire is then advanced through the pigtail and the pigtail catheter is exchanged for a rim catheter.  I positioned the detector in the LAO projection.  Using the Glidewire and the rim catheter I selected the right internal iliac artery.  Glidewire was removed and a prograde catheter that was then advanced.  Several images through the prograde catheter were then utilized to demonstrate the origin of the right uterine artery which was then selected with the prograde catheter and wire.  Hand-injection of contrast verified catheter positioning within the mid to distal right uterine artery.  I began with 2 cc of the medium sized PVC beads 500 to 700 m in size.  Follow-up imaging demonstrated a significant decrease in flow.  I then added 1 cc of the large sized PVC beads 700 to 900 m in size.  Again, follow-up imaging demonstrated further reduction in flow but the main right uterine artery remained patent therefore positioned the prograde in the mid to distal right uterine artery and deployed a 3 mm x 15 mm soft Ruby coil.  Followed by two 4 mm x 15 cm soft Ruby coils.  Follow-up imaging from the rim catheter in the proximal right internal iliac artery now demonstrated excellent flow through the main trunk as well  as the 2 large pelvic branches.  There is now none visualization of the right uterine artery.  Given this excellent result I elected to move forward with treatment of the left side.  Glidewire was then reintroduced into the rim catheter and the rim catheter was  negotiated up into the distal aorta where the rim catheter was then used to select the left common iliac.  Hand-injection of contrast in the RAO projection through the rim catheter clearly demonstrated the internal iliac on the left.  Using a combination of an advantage wire per the prograde catheter a stiff angled Glidewire a floppy Glidewire Kumpe catheter as well as a C2 catheter I was able to select the third order branches of the internal and to isolate the origin of the left uterine artery however this was a near total lateral projection and given the imaging quality as well as the marked tortuosity of the uterine artery I was then able to gain access into the uterine artery and therefore I could not safely proceed with embolization after numerous attempts I elected to terminate the procedure being mindful of her contrast load as well as radiation exposure.  If the patient agrees, I will plan for a follow-up attempt in several weeks   Findings:  Initial imaging from the distal aorta demonstrated normal aortic bifurcation with normal common internal and external iliac artery anatomy.  Even on the initial AP view very large tortuous uterine arteries are identified bilaterally.  Selective injection of the right uterine artery demonstrates the enlarged uterus in association with the numerous large blushes from the fibroids.  Following embolization as described above there is now no flow in the right uterine artery.  Selective injection of the left uterine artery demonstrates the enlarged uterus in association with the numerous large blushes from the fibroids.    Successful right sided uterine artery embolization    Disposition: Patient was taken to the recovery room in stable condition having tolerated the procedure well.  Lauren Hensley 07/29/2021,10:51 AM

## 2021-07-29 NOTE — Progress Notes (Signed)
Dr. Delana Meyer came by bedside to speak with pt. Pt. Remains nauseated. HR 47-60, variable. MD made aware. New orders received for Phenergan 25 mg IVPB. See MAR. Pt. Taking small sips of gingerale and broth.

## 2021-07-29 NOTE — Progress Notes (Signed)
Multiple IV attempts without success(by 3 different RN's) MD made aware . No CBC per MD. Pt. States she had an Iron infusion yesterday for her anemia.

## 2021-07-29 NOTE — Progress Notes (Signed)
Pt. Med. With zofran 4 mg slow IVP for c/o nausea (post dilaudid)... Pt. HOB set up 45 degrees. Pt. HR down to 53, then immed. Up to 60-70.

## 2021-07-29 NOTE — H&P (Signed)
[facility]   MRN : [id number]  Lauren Hensley is a 55 y.o. ([birth year]) female who presents with chief complaint of bleeding fibroids.  History of Present Illness:   Patient presents to [facility] today for uterine fibroid embolization.  She was last seen in the office June 23, 2021.  Since that time she has required further iron transfusions and continues to be anemic.  She continues to have significant vaginal bleeding.  This situation has been and increasing problem and is associated with persistent irregular vaginal bleeding  to the point she continues to be chronically anemic.  At this point hysterectomy has been recommended but the patient does not wish to proceed with surgery and therefore embolization was brought to our attention.  Transvaginal ultrasound was performed 04/25/2021 which demonstrates at least 3 masses consistent with fibroids measuring up to 5.3 cm x 3.7 cm x 4.6 cm in size they appear to be submucosal.  MRI of the pelvis with and without contrast was subsequently performed May 07, 2021 which showed 6-7 uterine fibroids ranging in size from 5.9 cm in maximal diameter to less than 1 cm.    No outpatient medications have been marked as taking for the 07/29/21 encounter [facility] Encounter).    Past Medical History:  Diagnosis Date   Anemia    Hypertension     Past Surgical History:  Procedure Laterality Date   COLONOSCOPY WITH PROPOFOL N/A 02/08/2021   Procedure: COLONOSCOPY WITH PROPOFOL;  Surgeon: Lin Landsman, MD;  Location: [facility] ENDOSCOPY;  Service: Gastroenterology;  Laterality: N/A;   ESOPHAGOGASTRODUODENOSCOPY N/A 02/08/2021   Procedure: ESOPHAGOGASTRODUODENOSCOPY (EGD);  Surgeon: Lin Landsman, MD;  Location: [facility] ENDOSCOPY;  Service: Gastroenterology;  Laterality: N/A;   GIVENS CAPSULE STUDY N/A 02/09/2021   Procedure: GIVENS CAPSULE STUDY;  Surgeon: Lesly Rubenstein, MD;  Location: [facility]  ENDOSCOPY;  Service: Endoscopy;  Laterality: N/A;    Social History Social History   Tobacco Use   Smoking status: Never   Smokeless tobacco: Never  Vaping Use   Vaping Use: Never used  Substance Use Topics   Alcohol use: Not Currently    Comment: 6-7 months ago   Drug use: Not Currently    Types: Marijuana, Cocaine    Family History Family History  Problem Relation Age of Onset   Diabetes Mother    COPD Mother    Heart Problems Mother    Emphysema Father    Hypercholesterolemia Brother    Diabetes Maternal Aunt    Obesity Maternal Aunt    Arthritis Maternal Aunt     No Known Allergies   REVIEW OF SYSTEMS (Negative unless checked)  Constitutional: [] Weight loss  [] Fever  [] Chills Cardiac: [] Chest pain   [] Chest pressure   [] Palpitations   [] Shortness of breath when laying flat   [] Shortness of breath with exertion. Vascular:  [] Pain in legs with walking   [] Pain in legs at rest  [] History of DVT   [] Phlebitis   [] Swelling in legs   [] Varicose veins   [] Non-healing ulcers Pulmonary:   [] Uses home oxygen   [] Productive cough   [] Hemoptysis   [] Wheeze  [] COPD   [] Asthma Neurologic:  [] Dizziness   [] Seizures   [] History of stroke   [] History of TIA  [] Aphasia   [] Vissual changes   [] Weakness or numbness in arm   [] Weakness or numbness in leg Musculoskeletal:   [] Joint swelling   [] Joint pain   [] Low back pain Hematologic:  [] Easy bruising  []   Easy bleeding   [] Hypercoagulable state   [x] Anemic Gastrointestinal:  [] Diarrhea   [] Vomiting  [] Gastroesophageal reflux/heartburn   [] Difficulty swallowing. Genitourinary:  [] Chronic kidney disease   [] Difficult urination  [] Frequent urination   [] Blood in urine Skin:  [] Rashes   [] Ulcers  Psychological:  [] History of anxiety   []  History of major depression.  Physical Examination  Vitals:   07/29/21 0724  BP: 133/73  Pulse: 73  Resp: 18  Temp: 98 F (36.7 C)  TempSrc: Oral  SpO2: 100%  Weight: 136.1 kg  Height: 5\' 7"   (1.702 m)   Body mass index is 46.99 kg/m. Gen: WD/WN, NAD Head: [place]/AT, No temporalis wasting.  Ear/Nose/Throat: Hearing grossly intact, nares w/o erythema or drainage Eyes: PER, EOMI, sclera nonicteric.  Neck: Supple, no masses.  No bruit or JVD.  Pulmonary:  Good air movement, no audible wheezing, no use of accessory muscles.  Cardiac: RRR, normal S1, S2, no Murmurs. Vascular:   Vessel Right Left  Radial Palpable Palpable  Gastrointestinal: soft, non-distended. No guarding/no peritoneal signs.  Musculoskeletal: M/S 5/5 throughout.  No visible deformity.  Neurologic: CN 2-12 intact. Pain and light touch intact in extremities.  Symmetrical.  Speech is fluent. Motor exam as listed above. Psychiatric: Judgment intact, Mood & affect appropriate for pt's clinical situation. Dermatologic: No rashes or ulcers noted.  No changes consistent with cellulitis.   CBC Lab Results  Component Value Date   WBC 3.9 (L) 07/07/2021   HGB 7.5 (L) 07/07/2021   HCT 24.7 (L) 07/07/2021   MCV 71.6 (L) 07/07/2021   PLT 263 07/07/2021    BMET    Component Value Date/Time   NA 136 03/13/2021 1519   K 4.0 03/13/2021 1519   CL 105 03/13/2021 1519   CO2 25 03/13/2021 1519   GLUCOSE 82 03/13/2021 1519   BUN 15 03/13/2021 1519   CREATININE 0.75 03/13/2021 1519   CALCIUM 8.9 03/13/2021 1519   GFRNONAA >60 03/13/2021 1519   CrCl cannot be calculated (Patient's most recent lab result is older than the maximum 21 days allowed.).  COAG Lab Results  Component Value Date   INR 1.3 (H) 02/01/2021    Radiology No results found.   Assessment/Plan 1. Vaginal bleeding The patient has a history of painful vaginal bleeding with multiple documented fibroids.  Transvaginal ultrasound was performed 04/25/2021 which demonstrates at least 3 masses consistent with fibroids measuring up to 5.3 cm x 3.7 cm x 4.6 cm in size they appear to be submucosal.  I have personally reviewed the MRI.  The MRI of the  pelvis with and without contrast was subsequently performed May 07, 2021 which showed 6-7 uterine fibroids ranging in size from 5.9 cm in maximal diameter to less than 1 cm.  She has been offered hysterectomy but was seeking a less invasive starting point for treatment of her uterine fibroids.  Given this embolization was recommended.  I have discussed the risks and benefits as well as alternative therapies.  I have discussed the procedure itself as well as the intra procedural and post procedural course.  All questions have been answered.  Patient is to see her OB/GYN this Friday and after a final discussion with her OB/GYN will decide on moving forward with embolization.   2. Fibroids, submucosal The patient has a history of painful vaginal bleeding with multiple documented fibroids.  Transvaginal ultrasound was performed 04/25/2021 which demonstrates at least 3 masses consistent with fibroids measuring up to 5.3 cm x 3.7 cm x  4.6 cm in size they appear to be submucosal.  I have personally reviewed the MRI.  The MRI of the pelvis with and without contrast was subsequently performed May 07, 2021 which showed 6-7 uterine fibroids ranging in size from 5.9 cm in maximal diameter to less than 1 cm.  She has been offered hysterectomy but was seeking a less invasive starting point for treatment of her uterine fibroids.  Given this embolization was recommended.  I have discussed the risks and benefits as well as alternative therapies.  I have discussed the procedure itself as well as the intra procedural and post procedural course.  All questions have been answered.  Patient is to see her OB/GYN this Friday and after a final discussion with her OB/GYN will decide on moving forward with embolization.   3. Cardiomyopathy, nonischemic ([place]) Continue cardiac and antihypertensive medications as already ordered and reviewed, no changes at this time.   Continue statin as ordered and reviewed, no changes  at this time   Nitrates PRN for chest pain    [name], MD  07/29/2021 8:11 AM

## 2021-07-29 NOTE — Interval H&P Note (Signed)
History and Physical Interval Note:  07/29/2021 8:18 AM  Lauren Hensley  has presented today for surgery, with the diagnosis of Uterine fibroid embolization  Uterine bleeding.  The various methods of treatment have been discussed with the patient and family. After consideration of risks, benefits and other options for treatment, the patient has consented to  Procedure(s): EMBOLIZATION (N/A) as a surgical intervention.  The patient's history has been reviewed, patient examined, no change in status, stable for surgery.  I have reviewed the patient's chart and labs.  Questions were answered to the patient's satisfaction.     [name]

## 2021-07-30 ENCOUNTER — Other Ambulatory Visit: Payer: Self-pay | Admitting: *Deleted

## 2021-07-30 ENCOUNTER — Encounter: Payer: Self-pay | Admitting: Vascular Surgery

## 2021-07-30 DIAGNOSIS — D500 Iron deficiency anemia secondary to blood loss (chronic): Secondary | ICD-10-CM

## 2021-07-30 DIAGNOSIS — Z9889 Other specified postprocedural states: Secondary | ICD-10-CM

## 2021-07-30 LAB — CBC
HCT: 21.3 % — ABNORMAL LOW (ref 36.0–46.0)
Hemoglobin: 6.3 g/dL — ABNORMAL LOW (ref 12.0–15.0)
MCH: 22 pg — ABNORMAL LOW (ref 26.0–34.0)
MCHC: 29.6 g/dL — ABNORMAL LOW (ref 30.0–36.0)
MCV: 74.5 fL — ABNORMAL LOW (ref 80.0–100.0)
Platelets: 224 10*3/uL (ref 150–400)
RBC: 2.86 MIL/uL — ABNORMAL LOW (ref 3.87–5.11)
RDW: 20.6 % — ABNORMAL HIGH (ref 11.5–15.5)
WBC: 7.6 10*3/uL (ref 4.0–10.5)
nRBC: 0.4 % — ABNORMAL HIGH (ref 0.0–0.2)

## 2021-07-30 LAB — HEMOGLOBIN AND HEMATOCRIT, BLOOD
HCT: 21.8 % — ABNORMAL LOW (ref 36.0–46.0)
Hemoglobin: 6.6 g/dL — ABNORMAL LOW (ref 12.0–15.0)

## 2021-07-30 LAB — PREPARE RBC (CROSSMATCH)

## 2021-07-30 MED ORDER — ACETAMINOPHEN 500 MG PO TABS: 1000.0000 mg | ORAL_TABLET | Freq: Four times a day (QID) | ORAL | Status: DC | PRN

## 2021-07-30 MED ORDER — KETOROLAC TROMETHAMINE 30 MG/ML IJ SOLN
30.0000 mg | Freq: Four times a day (QID) | INTRAMUSCULAR | Status: DC
  Administered 2021-07-30 – 2021-07-31 (×4): 30 mg via INTRAVENOUS
  Filled 2021-07-30 (×4): qty 1

## 2021-07-30 MED ORDER — ACETAMINOPHEN 325 MG PO TABS
650.0000 mg | ORAL_TABLET | Freq: Once | ORAL | Status: AC
  Administered 2021-07-30: 650 mg via ORAL
  Filled 2021-07-30: qty 2

## 2021-07-30 MED ORDER — FUROSEMIDE 10 MG/ML IJ SOLN
20.0000 mg | Freq: Once | INTRAMUSCULAR | Status: AC
  Administered 2021-07-30: 20 mg via INTRAVENOUS
  Filled 2021-07-30: qty 4

## 2021-07-30 MED ORDER — COVID-19 MRNA VAC-TRIS(PFIZER) 30 MCG/0.3ML IM SUSP
0.3000 mL | Freq: Once | INTRAMUSCULAR | Status: AC
  Administered 2021-07-31: 0.3 mL via INTRAMUSCULAR
  Filled 2021-07-30 (×2): qty 0.3

## 2021-07-30 MED ORDER — METOCLOPRAMIDE HCL 10 MG PO TABS: 10.0000 mg | ORAL_TABLET | Freq: Four times a day (QID) | ORAL | Status: DC | PRN

## 2021-07-30 MED ORDER — HEPARIN SODIUM (PORCINE) 5000 UNIT/ML IJ SOLN
5000.0000 [IU] | Freq: Three times a day (TID) | INTRAMUSCULAR | Status: DC
  Administered 2021-07-30 – 2021-07-31 (×3): 5000 [IU] via SUBCUTANEOUS
  Filled 2021-07-30 (×3): qty 1

## 2021-07-30 MED ORDER — INFLUENZA VAC SPLIT QUAD 0.5 ML IM SUSY
0.5000 mL | PREFILLED_SYRINGE | INTRAMUSCULAR | Status: AC
  Administered 2021-07-31: 0.5 mL via INTRAMUSCULAR
  Filled 2021-07-30 (×3): qty 0.5

## 2021-07-30 MED ORDER — DIPHENHYDRAMINE HCL 50 MG/ML IJ SOLN
25.0000 mg | Freq: Once | INTRAMUSCULAR | Status: AC
  Administered 2021-07-30: 25 mg via INTRAVENOUS
  Filled 2021-07-30: qty 1

## 2021-07-30 MED ORDER — SODIUM CHLORIDE 0.9% IV SOLUTION: Freq: Once | INTRAVENOUS | Status: AC

## 2021-07-30 NOTE — Progress Notes (Addendum)
[facility] Daily Progress Note  07/29/21:             1.  Abdominal aortogram             2.  Selective injection of the right hypogastric artery and uterine artery third order catheter placement             3.  Selective injection of the left hypogastric artery and left uterine artery third order catheter placement             4.  Embolization of the right uterine artery using a combination of PVC beads with Ruby coils             5.  StarClose right common femoral artery  Subjective: Patient with continued and significant cramping this AM.  No acute issues overnight.  Objective: Vitals:   07/29/21 1621 07/29/21 2014 07/30/21 0424 07/30/21 0744  BP: (!) 163/82 (!) 128/52 138/68 111/62  Pulse: 75 76 63 66  Resp: 18 16 16 18   Temp: 98.3 F (36.8 C) 98.9 F (37.2 C) 98 F (36.7 C) 99.2 F (37.3 C)  TempSrc: Oral Oral Oral Oral  SpO2: 100% 100% 100% 99%  Weight:      Height:        Intake/Output Summary (Last 24 hours) at 07/30/2021 1248 Last data filed at 07/30/2021 1021 Gross per 24 hour  Intake 444 ml  Output --  Net 444 ml   Physical Exam: A&Ox3, NAD CV: RRR Pulmonary: CTA Bilaterally Abdomen: Soft, Nontender, Nondistended Right groin:  Access site: PAD removed.  Dressing is clean dry and intact.  No swelling or drainage. Vascular: Warm distally in toes.  Minimal edema.   Laboratory: CBC    Component Value Date/Time   WBC 3.9 (L) 07/07/2021 1223   HGB 7.5 (L) 07/07/2021 1223   HCT 24.7 (L) 07/07/2021 1223   PLT 263 07/07/2021 1223   BMET    Component Value Date/Time   NA 136 03/13/2021 1519   K 4.0 03/13/2021 1519   CL 105 03/13/2021 1519   CO2 25 03/13/2021 1519   GLUCOSE 82 03/13/2021 1519   BUN 6 07/29/2021 0807   CREATININE 0.90 07/29/2021 0807   CALCIUM 8.9 03/13/2021 1519   GFRNONAA >60 07/29/2021 4174   Assessment/Planning: The patient is a 55 year old female who presented for a scheduled uterine embolization secondary to  menomenorrhagia from uterine fibroids status post embolization - POD#1  1) Patient with continued and significant cramping overnight into this afternoon.  Patient notes that she is in too much discomfort to be discharged home. This is understandable due to the nature of her procedure.  Reassess again in the AM.  We will add Toradol to pain regimen.  AM BMP. 2) CBC pending for today. 3) OK to be out of bed and ambulating. I encouraged this. 4) Some continued nausea.  Zofran and 2 doses of Reglan ordered.  Addendum: Patient CBC came back with a hemoglobin of 6.3. Will transfuse 1 unit packed red blood cells.  2 units on hold. Follow-up posttransfusion CBC.  As well as AM CBC.  Discussed with Dr. Eber Hong Contrell Ballentine PA-C 07/30/2021 12:48 PM

## 2021-07-31 ENCOUNTER — Other Ambulatory Visit: Payer: Self-pay

## 2021-07-31 ENCOUNTER — Encounter: Payer: Self-pay | Admitting: Internal Medicine

## 2021-07-31 DIAGNOSIS — R109 Unspecified abdominal pain: Secondary | ICD-10-CM | POA: Diagnosis present

## 2021-07-31 DIAGNOSIS — D259 Leiomyoma of uterus, unspecified: Secondary | ICD-10-CM | POA: Diagnosis present

## 2021-07-31 LAB — CBC
HCT: 25.8 % — ABNORMAL LOW (ref 36.0–46.0)
Hemoglobin: 7.7 g/dL — ABNORMAL LOW (ref 12.0–15.0)
MCH: 22.3 pg — ABNORMAL LOW (ref 26.0–34.0)
MCHC: 29.8 g/dL — ABNORMAL LOW (ref 30.0–36.0)
MCV: 74.8 fL — ABNORMAL LOW (ref 80.0–100.0)
Platelets: 200 10*3/uL (ref 150–400)
RBC: 3.45 MIL/uL — ABNORMAL LOW (ref 3.87–5.11)
RDW: 20.9 % — ABNORMAL HIGH (ref 11.5–15.5)
WBC: 7.8 10*3/uL (ref 4.0–10.5)
nRBC: 0 % (ref 0.0–0.2)

## 2021-07-31 LAB — BASIC METABOLIC PANEL
Anion gap: 4 — ABNORMAL LOW (ref 5–15)
BUN: 6 mg/dL (ref 6–20)
CO2: 22 mmol/L (ref 22–32)
Calcium: 8.2 mg/dL — ABNORMAL LOW (ref 8.9–10.3)
Chloride: 110 mmol/L (ref 98–111)
Creatinine, Ser: 0.79 mg/dL (ref 0.44–1.00)
GFR, Estimated: >60 mL/min (ref >60)
Glucose, Bld: 101 mg/dL — ABNORMAL HIGH (ref 70–99)
Potassium: 3.7 mmol/L (ref 3.5–5.1)
Sodium: 136 mmol/L (ref 135–145)

## 2021-07-31 MED ORDER — OXYCODONE HCL 5 MG PO TABS
5.0000 mg | ORAL_TABLET | Freq: Four times a day (QID) | ORAL | 0 refills | Status: DC | PRN
Start: 2021-07-31 — End: 2021-07-31

## 2021-07-31 MED ORDER — OXYCODONE HCL 5 MG PO TABS: 5.0000 mg | ORAL_TABLET | Freq: Four times a day (QID) | ORAL | 0 refills | Status: DC | PRN

## 2021-07-31 MED ORDER — MEDROXYPROGESTERONE ACETATE 10 MG PO TABS
20.0000 mg | ORAL_TABLET | Freq: Three times a day (TID) | ORAL | 3 refills | Status: DC
  Filled 2021-07-31: qty 120, 20d supply, fill #0

## 2021-07-31 NOTE — Discharge Instructions (Signed)
You may shower.  Please keep your groins clean and dry. Please do not engage in strenuous activity or lifting greater than 10 pounds for at least 2 weeks.

## 2021-07-31 NOTE — Discharge Summary (Signed)
[facility]    Discharge Summary  Patient ID:  Lauren Hensley MRN: [id number] DOB/AGE: 1966/01/19 55 y.o.  Admit date: 07/29/2021 Discharge date: 07/31/2021 Date of Surgery: 07/29/2021 Surgeon: Surgeon(s): Schnier, Dolores Lory, MD  Admission Diagnosis: Abdominal pain with radiation to back [R10.9] Status post embolization of uterine artery [Z98.890] Abdominal pain [R10.9] Uterine fibroid [D25.9]  Discharge Diagnoses:  Abdominal pain with radiation to back [R10.9] Status post embolization of uterine artery [Z98.890] Abdominal pain [R10.9] Uterine fibroid [D25.9]  Secondary Diagnoses: Past Medical History:  Diagnosis Date   Anemia    Hypertension    Procedure(s): 07/29/21:             1.  Abdominal aortogram             2.  Selective injection of the right hypogastric artery and uterine artery third order catheter placement             3.  Selective injection of the left hypogastric artery and left uterine artery third order catheter placement             4.  Embolization of the right uterine artery using a combination of PVC beads with Ruby coils             5.  StarClose right common femoral artery  Discharged Condition: Good  HPI / Hospital Course:  Patient has profound anemia secondary to menometrorrhagia from her uterine fibroids.  She has been offered hysterectomy as well as embolization.  She is elected proceed with embolization risks and benefits of been reviewed all questions answered patient agrees to proceed. On 07/29/21, the patient underwent:              1.  Abdominal aortogram             2.  Selective injection of the right hypogastric artery and uterine artery third order catheter placement             3.  Selective injection of the left hypogastric artery and left uterine artery third order catheter placement             4.  Embolization of the right uterine artery using a combination of PVC beads with Ruby coils             5.   StarClose right common femoral artery  The patient tolerated the procedure well was transferred from the angiography suite to the surgical floor for pain control and anemia status post the procedure.  Follow-up labs are notable for hemoglobin of 6.3.  The patient received 2 units packed red blood cells and responded appropriately.  During her brief stay, her diet was advanced, she was urinating on her own, her discomfort was controlled with the use of p.o. pain medication and she was ambulating at baseline.  Day of discharge, the patient was afebrile with stable vital signs and essentially unremarkable physical exam.  Physical Exam:  A&Ox3, NAD CV: RRR Pulmonary: CTA Bilaterally Abdomen: Soft, Nontender, Nondistended Right groin:             Access site: PAD removed.  Dressing is clean dry and intact.  No swelling or drainage. Vascular: Warm distally in toes.  Minimal edema.  Labs: As below  Complications: None  Consults: None  Significant Diagnostic Studies: CBC Lab Results  Component Value Date   WBC 7.8 07/31/2021   HGB 7.7 (L) 07/31/2021   HCT 25.8 (L) 07/31/2021   MCV 74.8 (L)  07/31/2021   PLT 200 07/31/2021   BMET    Component Value Date/Time   NA 136 07/31/2021 0109   K 3.7 07/31/2021 0109   CL 110 07/31/2021 0109   CO2 22 07/31/2021 0109   GLUCOSE 101 (H) 07/31/2021 0109   BUN 6 07/31/2021 0109   CREATININE 0.79 07/31/2021 0109   CALCIUM 8.2 (L) 07/31/2021 0109   GFRNONAA >60 07/31/2021 0109   COAG Lab Results  Component Value Date   INR 1.3 (H) 02/01/2021   Disposition:  Discharge to :Home Discharge Instructions     Call MD for:  redness, tenderness, or signs of infection (pain, swelling, bleeding, redness, odor or green/yellow discharge around incision site)   Complete by: As directed    Call MD for:  severe or increased pain, loss or decreased feeling  in affected limb(s)   Complete by: As directed    Call MD for:  temperature >100.5   Complete by: As  directed    Discharge instructions   Complete by: As directed    Okay to shower after 36 hours.  Please remove the bandage from the right groin before showering and replace with a Band-Aid daily as needed   Driving Restrictions   Complete by: As directed    No driving for 36 hours   Lifting restrictions   Complete by: As directed    No lifting for 1 week   No dressing needed   Complete by: As directed    Replace only if drainage present      Allergies as of 07/31/2021   No Known Allergies      Medication List     TAKE these medications    medroxyPROGESTERone 10 MG tablet Commonly known as: PROVERA Take 2 tablets (20 mg total) by mouth 3 (three) times daily.   oxyCODONE 5 MG immediate release tablet Commonly known as: Oxy IR/ROXICODONE Take 1-2 tablets (5-10 mg total) by mouth every 6 (six) hours as needed for moderate pain.       Verbal and written Discharge instructions given to the patient. Wound care per Discharge AVS  Follow-up Information     Schnier, Dolores Lory, MD Follow up in 2 week(s).   Specialties: Vascular Surgery, Cardiology, Radiology, Vascular Surgery Why: follow up after procedure no studies [facility] Nov. 17 th at 1:45 PM Contact information: [place] [address] [phone]                Signed: Sela Hua, PA-C 07/31/2021, 12:39 PM

## 2021-07-31 NOTE — Plan of Care (Signed)

## 2021-07-31 NOTE — Plan of Care (Signed)

## 2021-07-31 NOTE — TOC CM/SW Note (Signed)
Patient has orders to discharge home today. Chart reviewed. PCP is Adrian Prows, MD. On room air. No wounds. No TOC needs identified. CSW signing off.  Dayton Scrape, [place]

## 2021-08-01 LAB — BPAM RBC
Blood Product Expiration Date: 202211202359
Blood Product Expiration Date: 202212072359
Blood Product Expiration Date: 202212072359
ISSUE DATE / TIME: 202211021719
ISSUE DATE / TIME: 202211022240
Unit Type and Rh: 5100
Unit Type and Rh: 5100
Unit Type and Rh: 7300

## 2021-08-01 LAB — TYPE AND SCREEN
ABO/RH(D): B POS
Antibody Screen: NEGATIVE
Unit division: 0
Unit division: 0
Unit division: 0

## 2021-08-04 ENCOUNTER — Ambulatory Visit: Payer: Medicaid Other

## 2021-08-05 ENCOUNTER — Inpatient Hospital Stay (HOSPITAL_BASED_OUTPATIENT_CLINIC_OR_DEPARTMENT_OTHER): Payer: Medicaid Other | Admitting: Internal Medicine

## 2021-08-05 ENCOUNTER — Encounter: Payer: Self-pay | Admitting: Internal Medicine

## 2021-08-05 ENCOUNTER — Other Ambulatory Visit: Payer: Self-pay

## 2021-08-05 ENCOUNTER — Inpatient Hospital Stay: Payer: Medicaid Other

## 2021-08-05 ENCOUNTER — Inpatient Hospital Stay: Payer: Medicaid Other | Attending: Nurse Practitioner

## 2021-08-05 ENCOUNTER — Telehealth: Payer: Self-pay | Admitting: *Deleted

## 2021-08-05 DIAGNOSIS — D500 Iron deficiency anemia secondary to blood loss (chronic): Secondary | ICD-10-CM

## 2021-08-05 DIAGNOSIS — R5383 Other fatigue: Secondary | ICD-10-CM | POA: Insufficient documentation

## 2021-08-05 DIAGNOSIS — Z8349 Family history of other endocrine, nutritional and metabolic diseases: Secondary | ICD-10-CM | POA: Insufficient documentation

## 2021-08-05 DIAGNOSIS — Z79899 Other long term (current) drug therapy: Secondary | ICD-10-CM | POA: Insufficient documentation

## 2021-08-05 DIAGNOSIS — R42 Dizziness and giddiness: Secondary | ICD-10-CM | POA: Insufficient documentation

## 2021-08-05 DIAGNOSIS — Z833 Family history of diabetes mellitus: Secondary | ICD-10-CM | POA: Insufficient documentation

## 2021-08-05 DIAGNOSIS — M2550 Pain in unspecified joint: Secondary | ICD-10-CM | POA: Insufficient documentation

## 2021-08-05 DIAGNOSIS — Z8261 Family history of arthritis: Secondary | ICD-10-CM | POA: Insufficient documentation

## 2021-08-05 DIAGNOSIS — D509 Iron deficiency anemia, unspecified: Secondary | ICD-10-CM

## 2021-08-05 DIAGNOSIS — N920 Excessive and frequent menstruation with regular cycle: Secondary | ICD-10-CM | POA: Insufficient documentation

## 2021-08-05 DIAGNOSIS — Z836 Family history of other diseases of the respiratory system: Secondary | ICD-10-CM | POA: Insufficient documentation

## 2021-08-05 DIAGNOSIS — D649 Anemia, unspecified: Secondary | ICD-10-CM

## 2021-08-05 DIAGNOSIS — D563 Thalassemia minor: Secondary | ICD-10-CM | POA: Insufficient documentation

## 2021-08-05 LAB — CBC WITH DIFFERENTIAL/PLATELET
Abs Immature Granulocytes: 0.05 10*3/uL (ref 0.00–0.07)
Basophils Absolute: 0.1 10*3/uL (ref 0.0–0.1)
Basophils Relative: 2 %
Eosinophils Absolute: 0.1 10*3/uL (ref 0.0–0.5)
Eosinophils Relative: 1 %
HCT: 34.7 % — ABNORMAL LOW (ref 36.0–46.0)
Hemoglobin: 10.2 g/dL — ABNORMAL LOW (ref 12.0–15.0)
Immature Granulocytes: 1 %
Lymphocytes Relative: 31 %
Lymphs Abs: 1.3 10*3/uL (ref 0.7–4.0)
MCH: 22.1 pg — ABNORMAL LOW (ref 26.0–34.0)
MCHC: 29.4 g/dL — ABNORMAL LOW (ref 30.0–36.0)
MCV: 75.1 fL — ABNORMAL LOW (ref 80.0–100.0)
Monocytes Absolute: 0.3 10*3/uL (ref 0.1–1.0)
Monocytes Relative: 6 %
Neutro Abs: 2.5 10*3/uL (ref 1.7–7.7)
Neutrophils Relative %: 59 %
Platelets: 218 10*3/uL (ref 150–400)
RBC: 4.62 MIL/uL (ref 3.87–5.11)
RDW: 20.8 % — ABNORMAL HIGH (ref 11.5–15.5)
WBC: 4.3 10*3/uL (ref 4.0–10.5)
nRBC: 0 % (ref 0.0–0.2)

## 2021-08-05 LAB — IRON AND TIBC
Iron: 53 ug/dL (ref 28–170)
Saturation Ratios: 13 % (ref 10.4–31.8)
TIBC: 416 ug/dL (ref 250–450)
UIBC: 363 ug/dL

## 2021-08-05 LAB — FERRITIN: Ferritin: 46 ng/mL (ref 11–307)

## 2021-08-05 NOTE — Progress Notes (Signed)
Lau[facility] OFFICE PROGRESS NOTE  Patient Care Team: Homero Fellers, MD as PCP - General (Obstetrics and Gynecology) Rico Junker, RN as Registered Nurse Theodore Demark, RN as Registered Nurse  Cancer Staging No matching staging information was found for the patient.   Oncology History   No history exists.   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   #Heavy menstrual cycles-fibroids [Dr Shuman]   INTERVAL HISTORY: Ambulating independently.  Lauren Hensley 55 y.o.  female pleasant patient history of iron deficient anemia/beta thalassemia-other history of fibroids/heavy menstrual cycle is here for follow-up.  Patient interim has been evaluated by vascular,Dr.Dew.  However patient is still awaiting repeat embolization.  Patient's hemoglobin was noted to be 6.6 in the hospital.  Received 2 units of PRBC.  Patient feels tired.  Uterine bleeding is improved.  Review of Systems  Constitutional:  Positive for malaise/fatigue. Negative for chills, diaphoresis, fever and weight loss.  HENT:  Negative for nosebleeds and sore throat.   Eyes:  Negative for double vision.  Respiratory:  Negative for cough, hemoptysis, sputum production, shortness of breath and wheezing.   Cardiovascular:  Negative for chest pain, palpitations, orthopnea and leg swelling.  Gastrointestinal:  Negative for abdominal pain, blood in stool, constipation, diarrhea, heartburn, melena, nausea and vomiting.  Genitourinary:  Negative for dysuria, frequency and urgency.  Musculoskeletal:  Positive for joint pain. Negative for back pain.  Skin: Negative.  Negative for itching and rash.  Neurological:  Positive for dizziness. Negative for tingling, focal weakness, weakness and headaches.  Endo/Heme/Allergies:  Does not bruise/bleed easily.  Psychiatric/Behavioral:  Negative for depression. The patient is not nervous/anxious  and does not have insomnia.      PAST MEDICAL HISTORY :  Past Medical History:  Diagnosis Date   Anemia    Hypertension     PAST SURGICAL HISTORY :   Past Surgical History:  Procedure Laterality Date   COLONOSCOPY WITH PROPOFOL N/A 02/08/2021   Procedure: COLONOSCOPY WITH PROPOFOL;  Surgeon: Lin Landsman, MD;  Location: [facility] ENDOSCOPY;  Service: Gastroenterology;  Laterality: N/A;   EMBOLIZATION N/A 07/29/2021   Procedure: EMBOLIZATION;  Surgeon: Katha Cabal, MD;  Location: [place] [facility];  Service: Cardiovascular;  Laterality: N/A;   ESOPHAGOGASTRODUODENOSCOPY N/A 02/08/2021   Procedure: ESOPHAGOGASTRODUODENOSCOPY (EGD);  Surgeon: Lin Landsman, MD;  Location: [facility] ENDOSCOPY;  Service: Gastroenterology;  Laterality: N/A;   GIVENS CAPSULE STUDY N/A 02/09/2021   Procedure: GIVENS CAPSULE STUDY;  Surgeon: Lesly Rubenstein, MD;  Location: [facility] ENDOSCOPY;  Service: Endoscopy;  Laterality: N/A;    FAMILY HISTORY :   Family History  Problem Relation Age of Onset   Diabetes Mother    COPD Mother    Heart Problems Mother    Emphysema Father    Hypercholesterolemia Brother    Diabetes Maternal Aunt    Obesity Maternal Aunt    Arthritis Maternal Aunt     SOCIAL HISTORY:   Social History   Tobacco Use   Smoking status: Never   Smokeless tobacco: Never  Vaping Use   Vaping Use: Never used  Substance Use Topics   Alcohol use: Not Currently    Comment: 6-7 months ago   Drug use: Not Currently    Types: Marijuana, Cocaine    ALLERGIES:  has No Known Allergies.  MEDICATIONS:  Current Outpatient Medications  Medication Sig Dispense Refill   medroxyPROGESTERone (PROVERA) 10 MG tablet Take 2 tablets (20  mg total) by mouth 3 (three) times daily. 30 tablet 3   oxyCODONE (OXY IR/ROXICODONE) 5 MG immediate release tablet Take 1-2 tablets (5-10 mg total) by mouth every 6 (six) hours as needed for moderate pain or severe pain. 50 tablet 0   No current  facility-administered medications for this visit.    PHYSICAL EXAMINATION:  BP (!) 143/84   Pulse 63   Temp (!) 96.8 F (36 C) (Tympanic)   Resp 20   Ht 5\' 7"  (1.702 m)   Wt (!) 312 lb (141.5 kg)   LMP 03/03/2021   BMI 48.87 kg/m   Filed Weights   08/05/21 1341  Weight: (!) 312 lb (141.5 kg)    Physical Exam Vitals and nursing note reviewed.  HENT:     Head: Normocephalic and atraumatic.     Mouth/Throat:     Pharynx: Oropharynx is clear.  Eyes:     Extraocular Movements: Extraocular movements intact.     Pupils: Pupils are equal, round, and reactive to light.  Cardiovascular:     Rate and Rhythm: Normal rate and regular rhythm.  Pulmonary:     Comments: Decreased breath sounds bilaterally.  Abdominal:     Palpations: Abdomen is soft.  Musculoskeletal:        General: Normal range of motion.     Cervical back: Normal range of motion.  Skin:    General: Skin is warm.  Neurological:     General: No focal deficit present.     Mental Status: She is alert and oriented to person, place, and time.  Psychiatric:        Behavior: Behavior normal.        Judgment: Judgment normal.       LABORATORY DATA:  I have reviewed the data as listed    Component Value Date/Time   NA 136 07/31/2021 0109   K 3.7 07/31/2021 0109   CL 110 07/31/2021 0109   CO2 22 07/31/2021 0109   GLUCOSE 101 (H) 07/31/2021 0109   BUN 6 07/31/2021 0109   CREATININE 0.79 07/31/2021 0109   CALCIUM 8.2 (L) 07/31/2021 0109   PROT 7.3 03/13/2021 1519   ALBUMIN 3.9 03/13/2021 1519   AST 15 03/13/2021 1519   ALT 11 03/13/2021 1519   ALKPHOS 33 (L) 03/13/2021 1519   BILITOT 0.9 03/13/2021 1519   GFRNONAA >60 07/31/2021 0109    No results found for: SPEP, UPEP  Lab Results  Component Value Date   WBC 4.3 08/05/2021   NEUTROABS 2.5 08/05/2021   HGB 10.2 (L) 08/05/2021   HCT 34.7 (L) 08/05/2021   MCV 75.1 (L) 08/05/2021   PLT 218 08/05/2021      Chemistry      Component Value  Date/Time   NA 136 07/31/2021 0109   K 3.7 07/31/2021 0109   CL 110 07/31/2021 0109   CO2 22 07/31/2021 0109   BUN 6 07/31/2021 0109   CREATININE 0.79 07/31/2021 0109      Component Value Date/Time   CALCIUM 8.2 (L) 07/31/2021 0109   ALKPHOS 33 (L) 03/13/2021 1519   AST 15 03/13/2021 1519   ALT 11 03/13/2021 1519   BILITOT 0.9 03/13/2021 1519       RADIOGRAPHIC STUDIES: I have personally reviewed the radiological images as listed and agreed with the findings in the report. No results found.   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  Hemoglobin today 10.5 [2/p 2 units in hospital].  Proceed with IV Venofer weekly x4.     #Menorrhagia-fibroids s/p gynecology evaluation. S/p partial embolization [Dr.Dew]; awaiting repeat embolization on left side.   # DISPOSITION # venofer today # weekly venofer x4/ :needs transportation # follow up in 4 weeks- NP; labs- cbc; possible- venofer- Dr.B   No orders of the defined types were placed in this encounter.  All questions were answered. The patient knows to call the clinic with any problems, questions or concerns.      Cammie Sickle, MD 08/05/2021 2:12 PM

## 2021-08-05 NOTE — Progress Notes (Signed)
87- Today's lab work is in process and results are pending. Per MD, Dr. Rogue Bussing, order: proceed with scheduled Venofer at this time.  1458- Unable to administer Venofer infusion today due to inability to obtain peripheral IV access. Staff attempted 6 peripheral IV sticks. Patient reports this has been an ongoing issue and she is open to other options such an implanted port or PICC line placement. MD, Dr. Rogue Bussing, notified and aware.   1520- Patient discharging to home at this time. Patient informed that MD team will follow up with her regarding possible implanted port placement. Patient instructed to keep follow-up appointments as scheduled and MD team will be in contact with her regarding any changes. Patient verbalized understanding.

## 2021-08-05 NOTE — Assessment & Plan Note (Addendum)
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  Hemoglobin today 10.5 [2/p 2 units in hospital].  Proceed with IV Venofer weekly x4.    #Menorrhagia-fibroids s/p gynecology evaluation. S/p partial embolization [Dr.Dew]; awaiting repeat embolization on left side.   # DISPOSITION # venofer today # weekly venofer x4/ :needs transportation # follow up in 4 weeks- NP; labs- cbc; possible- venofer- Dr.B

## 2021-08-05 NOTE — Telephone Encounter (Signed)
dr b spoke with Dr. Delana Meyer. we will hold off for now on port a cath and further iv iron treatment. Hgb may improve since recent embolozation. Colette- pls cnl the future infusion next week and 11/22 change the apt to a lab only.  I will call the patient.

## 2021-08-05 NOTE — Telephone Encounter (Signed)
Roxie called patient. She is aware of plan of care

## 2021-08-06 LAB — PREPARE RBC (CROSSMATCH)

## 2021-08-12 ENCOUNTER — Ambulatory Visit: Payer: Medicaid Other

## 2021-08-14 ENCOUNTER — Other Ambulatory Visit: Payer: Self-pay

## 2021-08-14 ENCOUNTER — Encounter (INDEPENDENT_AMBULATORY_CARE_PROVIDER_SITE_OTHER): Payer: Self-pay | Admitting: Vascular Surgery

## 2021-08-14 ENCOUNTER — Ambulatory Visit (INDEPENDENT_AMBULATORY_CARE_PROVIDER_SITE_OTHER): Payer: Self-pay | Admitting: Vascular Surgery

## 2021-08-14 VITALS — BP 140/85 | HR 73 | Resp 16 | Wt 303.0 lb

## 2021-08-14 DIAGNOSIS — D259 Leiomyoma of uterus, unspecified: Secondary | ICD-10-CM

## 2021-08-14 DIAGNOSIS — N939 Abnormal uterine and vaginal bleeding, unspecified: Secondary | ICD-10-CM

## 2021-08-14 NOTE — H&P (View-Only) (Signed)
MRN : [id number]  Lauren Hensley is a 55 y.o. ([birth year]) female who presents with chief complaint of follow up.  History of Present Illness:   The patient returns to the office for followup and review status post angiogram with intervention. The patient notes improvement in her bleeding.  In fact, her Hgb is about 10 and they are telling her the plan is to stop the infusion and just start iron pills.  There have been no significant changes to the patient's overall health care.  The patient denies history of DVT, PE or superficial thrombophlebitis. The patient denies recent episodes of angina or shortness of breath.    Current Meds  Medication Sig   medroxyPROGESTERone (PROVERA) 10 MG tablet Take 2 tablets (20 mg total) by mouth 3 (three) times daily.   oxyCODONE (OXY IR/ROXICODONE) 5 MG immediate release tablet Take 1-2 tablets (5-10 mg total) by mouth every 6 (six) hours as needed for moderate pain or severe pain.    Past Medical History:  Diagnosis Date   Anemia    Hypertension     Past Surgical History:  Procedure Laterality Date   COLONOSCOPY WITH PROPOFOL N/A 02/08/2021   Procedure: COLONOSCOPY WITH PROPOFOL;  Surgeon: Lin Landsman, MD;  Location: [facility] ENDOSCOPY;  Service: Gastroenterology;  Laterality: N/A;   EMBOLIZATION N/A 07/29/2021   Procedure: EMBOLIZATION;  Surgeon: Katha Cabal, MD;  Location: [place] [facility];  Service: Cardiovascular;  Laterality: N/A;   ESOPHAGOGASTRODUODENOSCOPY N/A 02/08/2021   Procedure: ESOPHAGOGASTRODUODENOSCOPY (EGD);  Surgeon: Lin Landsman, MD;  Location: [facility] ENDOSCOPY;  Service: Gastroenterology;  Laterality: N/A;   GIVENS CAPSULE STUDY N/A 02/09/2021   Procedure: GIVENS CAPSULE STUDY;  Surgeon: Lesly Rubenstein, MD;  Location: [facility] ENDOSCOPY;  Service: Endoscopy;  Laterality: N/A;    Social History Social History   Tobacco Use   Smoking status: Never   Smokeless tobacco: Never  Vaping Use   Vaping  Use: Never used  Substance Use Topics   Alcohol use: Not Currently    Comment: 6-7 months ago   Drug use: Not Currently    Types: Marijuana, Cocaine    Family History Family History  Problem Relation Age of Onset   Diabetes Mother    COPD Mother    Heart Problems Mother    Emphysema Father    Hypercholesterolemia Brother    Diabetes Maternal Aunt    Obesity Maternal Aunt    Arthritis Maternal Aunt     No Known Allergies   REVIEW OF SYSTEMS (Negative unless checked)  Constitutional: [] Weight loss  [] Fever  [] Chills Cardiac: [] Chest pain   [] Chest pressure   [] Palpitations   [] Shortness of breath when laying flat   [] Shortness of breath with exertion. Vascular:  [] Pain in legs with walking   [] Pain in legs at rest  [] History of DVT   [] Phlebitis   [] Swelling in legs   [] Varicose veins   [] Non-healing ulcers Pulmonary:   [] Uses home oxygen   [] Productive cough   [] Hemoptysis   [] Wheeze  [] COPD   [] Asthma Neurologic:  [] Dizziness   [] Seizures   [] History of stroke   [] History of TIA  [] Aphasia   [] Vissual changes   [] Weakness or numbness in arm   [] Weakness or numbness in leg Musculoskeletal:   [] Joint swelling   [] Joint pain   [] Low back pain Hematologic:  [] Easy bruising  [] Easy bleeding   [] Hypercoagulable state   [] Anemic Gastrointestinal:  [] Diarrhea   [] Vomiting  [] Gastroesophageal reflux/heartburn   []   Difficulty swallowing. Genitourinary:  [] Chronic kidney disease   [] Difficult urination  [] Frequent urination   [] Blood in urine Skin:  [] Rashes   [] Ulcers  Psychological:  [] History of anxiety   []  History of major depression.  Physical Examination  Vitals:   08/14/21 1407  BP: 140/85  Pulse: 73  Resp: 16  Weight: (!) 303 lb (137.4 kg)   Body mass index is 47.46 kg/m. Gen: WD/WN, NAD Head: [place]/AT, No temporalis wasting.  Ear/Nose/Throat: Hearing grossly intact, nares w/o erythema or drainage Eyes: PER, EOMI, sclera nonicteric.  Neck: Supple, no masses.  No bruit  or JVD.  Pulmonary:  Good air movement, no audible wheezing, no use of accessory muscles.  Cardiac: RRR, normal S1, S2, no Murmurs. Vascular:   Vessel Right Left  Radial Palpable Palpable  Gastrointestinal: soft, non-distended. No guarding/no peritoneal signs.  Musculoskeletal: M/S 5/5 throughout.  No visible deformity.  Neurologic: CN 2-12 intact. Pain and light touch intact in extremities.  Symmetrical.  Speech is fluent. Motor exam as listed above. Psychiatric: Judgment intact, Mood & affect appropriate for pt's clinical situation. Dermatologic: No rashes or ulcers noted.  No changes consistent with cellulitis.   CBC Lab Results  Component Value Date   WBC 4.3 08/05/2021   HGB 10.2 (L) 08/05/2021   HCT 34.7 (L) 08/05/2021   MCV 75.1 (L) 08/05/2021   PLT 218 08/05/2021    BMET    Component Value Date/Time   NA 136 07/31/2021 0109   K 3.7 07/31/2021 0109   CL 110 07/31/2021 0109   CO2 22 07/31/2021 0109   GLUCOSE 101 (H) 07/31/2021 0109   BUN 6 07/31/2021 0109   CREATININE 0.79 07/31/2021 0109   CALCIUM 8.2 (L) 07/31/2021 0109   GFRNONAA >60 07/31/2021 0109   Estimated Creatinine Clearance: 118 mL/min (by C-G formula based on SCr of 0.79 mg/dL).  COAG Lab Results  Component Value Date   INR 1.3 (H) 02/01/2021    Radiology PERIPHERAL VASCULAR CATHETERIZATION  Result Date: 07/29/2021 See surgical note for result.    Assessment/Plan 1. Uterine leiomyoma, unspecified location The patient has a history of painful vaginal bleeding with multiple documented fibroids.  Her vaginal bleeding is much improved.  I have reviewed the angiogram from 07/29/2021 with the patient.    Previous transvaginal ultrasound was performed 04/25/2021 which demonstrates at least 3 masses consistent with fibroids measuring up to 5.3 cm x 3.7 cm x 4.6 cm in size they appear to be submucosal.  The previous MRI of the pelvis with and without contrast was subsequently performed May 07, 2021  which showed 6-7 uterine fibroids ranging in size from 5.9 cm in maximal diameter to less than 1 cm.  I have recommended that we reangio the left side with the hope for embolization  I have discussed the risks and benefits as well as alternative therapies.  I have discussed the procedure itself as well as the intra procedural and post procedural course.  All questions have been answered.  Patient is to see her OB/GYN this Friday and after a final discussion with her OB/GYN will decide on moving forward with embolization.  2. Vaginal bleeding See #1    [name], MD  08/14/2021 2:36 PM

## 2021-08-14 NOTE — Progress Notes (Signed)
MRN : [id number]  Lauren Hensley is a 55 y.o. (1966-08-23) female who presents with chief complaint of follow up.  History of Present Illness:   The patient returns to the office for followup and review status post angiogram with intervention. The patient notes improvement in her bleeding.  In fact, her Hgb is about 10 and they are telling her the plan is to stop the infusion and just start iron pills.  There have been no significant changes to the patient's overall health care.  The patient denies history of DVT, PE or superficial thrombophlebitis. The patient denies recent episodes of angina or shortness of breath.    Current Meds  Medication Sig   medroxyPROGESTERone (PROVERA) 10 MG tablet Take 2 tablets (20 mg total) by mouth 3 (three) times daily.   oxyCODONE (OXY IR/ROXICODONE) 5 MG immediate release tablet Take 1-2 tablets (5-10 mg total) by mouth every 6 (six) hours as needed for moderate pain or severe pain.    Past Medical History:  Diagnosis Date   Anemia    Hypertension     Past Surgical History:  Procedure Laterality Date   COLONOSCOPY WITH PROPOFOL N/A 02/08/2021   Procedure: COLONOSCOPY WITH PROPOFOL;  Surgeon: Lin Landsman, MD;  Location: [facility] ENDOSCOPY;  Service: Gastroenterology;  Laterality: N/A;   EMBOLIZATION N/A 07/29/2021   Procedure: EMBOLIZATION;  Surgeon: Katha Cabal, MD;  Location: [place] [facility] LAB;  Service: Cardiovascular;  Laterality: N/A;   ESOPHAGOGASTRODUODENOSCOPY N/A 02/08/2021   Procedure: ESOPHAGOGASTRODUODENOSCOPY (EGD);  Surgeon: Lin Landsman, MD;  Location: [facility] ENDOSCOPY;  Service: Gastroenterology;  Laterality: N/A;   GIVENS CAPSULE STUDY N/A 02/09/2021   Procedure: GIVENS CAPSULE STUDY;  Surgeon: Lesly Rubenstein, MD;  Location: [facility] ENDOSCOPY;  Service: Endoscopy;  Laterality: N/A;    Social History Social History   Tobacco Use   Smoking status: Never   Smokeless tobacco: Never  Vaping Use   Vaping  Use: Never used  Substance Use Topics   Alcohol use: Not Currently    Comment: 6-7 months ago   Drug use: Not Currently    Types: Marijuana, Cocaine    Family History Family History  Problem Relation Age of Onset   Diabetes Mother    COPD Mother    Heart Problems Mother    Emphysema Father    Hypercholesterolemia Brother    Diabetes Maternal Aunt    Obesity Maternal Aunt    Arthritis Maternal Aunt     No Known Allergies   REVIEW OF SYSTEMS (Negative unless checked)  Constitutional: [] Weight loss  [] Fever  [] Chills Cardiac: [] Chest pain   [] Chest pressure   [] Palpitations   [] Shortness of breath when laying flat   [] Shortness of breath with exertion. Vascular:  [] Pain in legs with walking   [] Pain in legs at rest  [] History of DVT   [] Phlebitis   [] Swelling in legs   [] Varicose veins   [] Non-healing ulcers Pulmonary:   [] Uses home oxygen   [] Productive cough   [] Hemoptysis   [] Wheeze  [] COPD   [] Asthma Neurologic:  [] Dizziness   [] Seizures   [] History of stroke   [] History of TIA  [] Aphasia   [] Vissual changes   [] Weakness or numbness in arm   [] Weakness or numbness in leg Musculoskeletal:   [] Joint swelling   [] Joint pain   [] Low back pain Hematologic:  [] Easy bruising  [] Easy bleeding   [] Hypercoagulable state   [] Anemic Gastrointestinal:  [] Diarrhea   [] Vomiting  [] Gastroesophageal reflux/heartburn   []   Difficulty swallowing. Genitourinary:  [] Chronic kidney disease   [] Difficult urination  [] Frequent urination   [] Blood in urine Skin:  [] Rashes   [] Ulcers  Psychological:  [] History of anxiety   []  History of major depression.  Physical Examination  Vitals:   08/14/21 1407  BP: 140/85  Pulse: 73  Resp: 16  Weight: (!) 303 lb (137.4 kg)   Body mass index is 47.46 kg/m. Gen: WD/WN, NAD Head: [place]/AT, No temporalis wasting.  Ear/Nose/Throat: Hearing grossly intact, nares w/o erythema or drainage Eyes: PER, EOMI, sclera nonicteric.  Neck: Supple, no masses.  No bruit  or JVD.  Pulmonary:  Good air movement, no audible wheezing, no use of accessory muscles.  Cardiac: RRR, normal S1, S2, no Murmurs. Vascular:   Vessel Right Left  Radial Palpable Palpable  Gastrointestinal: soft, non-distended. No guarding/no peritoneal signs.  Musculoskeletal: M/S 5/5 throughout.  No visible deformity.  Neurologic: CN 2-12 intact. Pain and light touch intact in extremities.  Symmetrical.  Speech is fluent. Motor exam as listed above. Psychiatric: Judgment intact, Mood & affect appropriate for pt's clinical situation. Dermatologic: No rashes or ulcers noted.  No changes consistent with cellulitis.   CBC Lab Results  Component Value Date   WBC 4.3 08/05/2021   HGB 10.2 (L) 08/05/2021   HCT 34.7 (L) 08/05/2021   MCV 75.1 (L) 08/05/2021   PLT 218 08/05/2021    BMET    Component Value Date/Time   NA 136 07/31/2021 0109   K 3.7 07/31/2021 0109   CL 110 07/31/2021 0109   CO2 22 07/31/2021 0109   GLUCOSE 101 (H) 07/31/2021 0109   BUN 6 07/31/2021 0109   CREATININE 0.79 07/31/2021 0109   CALCIUM 8.2 (L) 07/31/2021 0109   GFRNONAA >60 07/31/2021 0109   Estimated Creatinine Clearance: 118 mL/min (by C-G formula based on SCr of 0.79 mg/dL).  COAG Lab Results  Component Value Date   INR 1.3 (H) 02/01/2021    Radiology PERIPHERAL VASCULAR CATHETERIZATION  Result Date: 07/29/2021 See surgical note for result.    Assessment/Plan 1. Uterine leiomyoma, unspecified location The patient has a history of painful vaginal bleeding with multiple documented fibroids.  Her vaginal bleeding is much improved.  I have reviewed the angiogram from 07/29/2021 with the patient.    Previous transvaginal ultrasound was performed 04/25/2021 which demonstrates at least 3 masses consistent with fibroids measuring up to 5.3 cm x 3.7 cm x 4.6 cm in size they appear to be submucosal.  The previous MRI of the pelvis with and without contrast was subsequently performed May 07, 2021  which showed 6-7 uterine fibroids ranging in size from 5.9 cm in maximal diameter to less than 1 cm.  I have recommended that we reangio the left side with the hope for embolization  I have discussed the risks and benefits as well as alternative therapies.  I have discussed the procedure itself as well as the intra procedural and post procedural course.  All questions have been answered.  Patient is to see her OB/GYN this Friday and after a final discussion with her OB/GYN will decide on moving forward with embolization.  2. Vaginal bleeding See #1    [name], MD  08/14/2021 2:36 PM

## 2021-08-15 ENCOUNTER — Telehealth (INDEPENDENT_AMBULATORY_CARE_PROVIDER_SITE_OTHER): Payer: Self-pay

## 2021-08-15 NOTE — Telephone Encounter (Signed)
I attempted to contact the patient to scheduled a left uterine artery embolization with Dr. Delana Meyer and a message was left for a return call.

## 2021-08-18 ENCOUNTER — Telehealth (INDEPENDENT_AMBULATORY_CARE_PROVIDER_SITE_OTHER): Payer: Self-pay

## 2021-08-18 NOTE — Telephone Encounter (Signed)
I attempted to contact the patient to schedule a UFE and a message was left for a return call.

## 2021-08-19 ENCOUNTER — Ambulatory Visit: Payer: Medicaid Other

## 2021-08-19 ENCOUNTER — Other Ambulatory Visit: Payer: Self-pay

## 2021-08-19 ENCOUNTER — Inpatient Hospital Stay: Payer: Medicaid Other

## 2021-08-19 DIAGNOSIS — D649 Anemia, unspecified: Secondary | ICD-10-CM

## 2021-08-19 LAB — CBC WITH DIFFERENTIAL/PLATELET
Abs Immature Granulocytes: 0.01 10*3/uL (ref 0.00–0.07)
Basophils Absolute: 0.1 10*3/uL (ref 0.0–0.1)
Basophils Relative: 2 %
Eosinophils Absolute: 0.1 10*3/uL (ref 0.0–0.5)
Eosinophils Relative: 1 %
HCT: 29.2 % — ABNORMAL LOW (ref 36.0–46.0)
Hemoglobin: 8.8 g/dL — ABNORMAL LOW (ref 12.0–15.0)
Immature Granulocytes: 0 %
Lymphocytes Relative: 41 %
Lymphs Abs: 1.8 10*3/uL (ref 0.7–4.0)
MCH: 22.3 pg — ABNORMAL LOW (ref 26.0–34.0)
MCHC: 30.1 g/dL (ref 30.0–36.0)
MCV: 73.9 fL — ABNORMAL LOW (ref 80.0–100.0)
Monocytes Absolute: 0.4 10*3/uL (ref 0.1–1.0)
Monocytes Relative: 9 %
Neutro Abs: 2.1 10*3/uL (ref 1.7–7.7)
Neutrophils Relative %: 47 %
Platelets: 227 10*3/uL (ref 150–400)
RBC: 3.95 MIL/uL (ref 3.87–5.11)
RDW: 18.7 % — ABNORMAL HIGH (ref 11.5–15.5)
WBC: 4.4 10*3/uL (ref 4.0–10.5)
nRBC: 0 % (ref 0.0–0.2)

## 2021-08-19 LAB — IRON AND TIBC
Iron: 33 ug/dL (ref 28–170)
Saturation Ratios: 8 % — ABNORMAL LOW (ref 10.4–31.8)
TIBC: 423 ug/dL (ref 250–450)
UIBC: 390 ug/dL

## 2021-08-19 LAB — FERRITIN: Ferritin: 12 ng/mL (ref 11–307)

## 2021-08-26 ENCOUNTER — Inpatient Hospital Stay: Payer: Medicaid Other

## 2021-08-26 ENCOUNTER — Other Ambulatory Visit: Payer: Self-pay

## 2021-08-26 VITALS — BP 139/90 | HR 70 | Temp 96.8°F | Resp 18

## 2021-08-26 DIAGNOSIS — D500 Iron deficiency anemia secondary to blood loss (chronic): Secondary | ICD-10-CM

## 2021-08-26 MED ORDER — SODIUM CHLORIDE 0.9 % IV SOLN
Freq: Once | INTRAVENOUS | Status: AC
  Filled 2021-08-26: qty 250

## 2021-08-26 MED ORDER — IRON SUCROSE 20 MG/ML IV SOLN
200.0000 mg | Freq: Once | INTRAVENOUS | Status: AC
  Administered 2021-08-26: 200 mg via INTRAVENOUS

## 2021-08-26 MED ORDER — SODIUM CHLORIDE 0.9 % IV SOLN: 200.0000 mg | Freq: Once | INTRAVENOUS | Status: DC

## 2021-08-27 ENCOUNTER — Encounter: Payer: Self-pay | Admitting: Internal Medicine

## 2021-08-28 ENCOUNTER — Telehealth (INDEPENDENT_AMBULATORY_CARE_PROVIDER_SITE_OTHER): Payer: Self-pay

## 2021-08-28 NOTE — Telephone Encounter (Signed)
Patient returned my call and is now scheduled with Dr. Delana Meyer for a left UFE on 09/02/21 with a 9:00 am arrival time to the MM. Pre-procedure instructions were discussed and will be mailed.

## 2021-09-02 ENCOUNTER — Ambulatory Visit: Payer: Medicaid Other

## 2021-09-02 ENCOUNTER — Other Ambulatory Visit: Payer: Self-pay

## 2021-09-02 ENCOUNTER — Encounter: Payer: Self-pay | Admitting: Vascular Surgery

## 2021-09-02 ENCOUNTER — Encounter
Admission: RE | Disposition: A | Discharge status: Home / Self Care | Payer: Medicaid Other | Source: Home / Self Care | Attending: Vascular Surgery

## 2021-09-02 ENCOUNTER — Other Ambulatory Visit (INDEPENDENT_AMBULATORY_CARE_PROVIDER_SITE_OTHER): Payer: Self-pay | Admitting: Nurse Practitioner

## 2021-09-02 ENCOUNTER — Inpatient Hospital Stay
Admission: RE | Admit: 2021-09-02 | Discharge: 2021-09-04 | DRG: 749 | Disposition: A | Discharge status: Home / Self Care | Payer: Medicaid Other | Attending: Vascular Surgery | Admitting: Vascular Surgery

## 2021-09-02 ENCOUNTER — Encounter: Payer: Self-pay | Admitting: Registered Nurse

## 2021-09-02 ENCOUNTER — Ambulatory Visit: Payer: Medicaid Other | Admitting: Nurse Practitioner

## 2021-09-02 ENCOUNTER — Other Ambulatory Visit: Payer: Medicaid Other

## 2021-09-02 DIAGNOSIS — N946 Dysmenorrhea, unspecified: Secondary | ICD-10-CM | POA: Diagnosis present

## 2021-09-02 DIAGNOSIS — D259 Leiomyoma of uterus, unspecified: Secondary | ICD-10-CM

## 2021-09-02 DIAGNOSIS — D62 Acute posthemorrhagic anemia: Secondary | ICD-10-CM | POA: Diagnosis present

## 2021-09-02 DIAGNOSIS — Z833 Family history of diabetes mellitus: Secondary | ICD-10-CM

## 2021-09-02 DIAGNOSIS — Z6841 Body Mass Index (BMI) 40.0 and over, adult: Secondary | ICD-10-CM

## 2021-09-02 DIAGNOSIS — N938 Other specified abnormal uterine and vaginal bleeding: Secondary | ICD-10-CM

## 2021-09-02 DIAGNOSIS — Z79899 Other long term (current) drug therapy: Secondary | ICD-10-CM

## 2021-09-02 DIAGNOSIS — I10 Essential (primary) hypertension: Secondary | ICD-10-CM | POA: Diagnosis present

## 2021-09-02 DIAGNOSIS — N920 Excessive and frequent menstruation with regular cycle: Secondary | ICD-10-CM | POA: Diagnosis present

## 2021-09-02 DIAGNOSIS — N939 Abnormal uterine and vaginal bleeding, unspecified: Secondary | ICD-10-CM

## 2021-09-02 DIAGNOSIS — Z825 Family history of asthma and other chronic lower respiratory diseases: Secondary | ICD-10-CM

## 2021-09-02 DIAGNOSIS — Z20822 Contact with and (suspected) exposure to covid-19: Secondary | ICD-10-CM | POA: Diagnosis present

## 2021-09-02 DIAGNOSIS — Z83438 Family history of other disorder of lipoprotein metabolism and other lipidemia: Secondary | ICD-10-CM

## 2021-09-02 HISTORY — PX: OTHER SURGICAL HISTORY: SHX169

## 2021-09-02 HISTORY — PX: EMBOLIZATION (CATH LAB): CATH118239

## 2021-09-02 LAB — FERRITIN: Ferritin: 29 ng/mL (ref 11–307)

## 2021-09-02 LAB — CREATININE, SERUM
Creatinine, Ser: 0.83 mg/dL (ref 0.44–1.00)
GFR, Estimated: >60 mL/min (ref >60)

## 2021-09-02 LAB — BUN: BUN: 16 mg/dL (ref 6–20)

## 2021-09-02 LAB — GLUCOSE, CAPILLARY: Glucose-Capillary: 105 mg/dL — ABNORMAL HIGH (ref 70–99)

## 2021-09-02 SURGERY — EMBOLIZATION
Anesthesia: Moderate Sedation

## 2021-09-02 MED ORDER — HEPARIN SODIUM (PORCINE) 1000 UNIT/ML IJ SOLN
INTRAMUSCULAR | Status: DC | PRN
  Administered 2021-09-02: 2000 [IU] via INTRAVENOUS

## 2021-09-02 MED ORDER — IODIXANOL 320 MG/ML IV SOLN
INTRAVENOUS | Status: DC | PRN
  Administered 2021-09-02: 10 mL

## 2021-09-02 MED ORDER — MIDAZOLAM HCL 2 MG/ML PO SYRP: 8.0000 mg | ORAL_SOLUTION | Freq: Once | ORAL | Status: DC | PRN

## 2021-09-02 MED ORDER — OXYCODONE-ACETAMINOPHEN 5-325 MG PO TABS
2.0000 | ORAL_TABLET | Freq: Once | ORAL | Status: AC
  Administered 2021-09-02: 2 via ORAL

## 2021-09-02 MED ORDER — OXYCODONE HCL 5 MG PO TABS: 5.0000 mg | ORAL_TABLET | ORAL | Status: DC | PRN

## 2021-09-02 MED ORDER — SODIUM CHLORIDE 0.9% FLUSH: 3.0000 mL | INTRAVENOUS | Status: DC | PRN

## 2021-09-02 MED ORDER — SODIUM CHLORIDE 0.9% FLUSH: 3.0000 mL | Freq: Two times a day (BID) | INTRAVENOUS | Status: DC

## 2021-09-02 MED ORDER — OXYCODONE HCL 5 MG PO TABS
5.0000 mg | ORAL_TABLET | ORAL | Status: DC | PRN
  Administered 2021-09-02: 5 mg via ORAL
  Filled 2021-09-02: qty 1
  Filled 2021-09-02: qty 2

## 2021-09-02 MED ORDER — SODIUM CHLORIDE 0.9 % IV SOLN: INTRAVENOUS | Status: AC

## 2021-09-02 MED ORDER — SODIUM CHLORIDE 0.9 % IV SOLN: 250.0000 mL | INTRAVENOUS | Status: DC | PRN

## 2021-09-02 MED ORDER — MORPHINE SULFATE (PF) 2 MG/ML IV SOLN: 2.0000 mg | Freq: Once | INTRAVENOUS | Status: DC

## 2021-09-02 MED ORDER — MEDROXYPROGESTERONE ACETATE 10 MG PO TABS
20.0000 mg | ORAL_TABLET | Freq: Three times a day (TID) | ORAL | Status: DC
  Administered 2021-09-02 – 2021-09-04 (×7): 20 mg via ORAL
  Filled 2021-09-02 (×8): qty 2

## 2021-09-02 MED ORDER — ONDANSETRON HCL 4 MG/2ML IJ SOLN
INTRAMUSCULAR | Status: AC
  Administered 2021-09-02: 4 mg
  Filled 2021-09-02: qty 2

## 2021-09-02 MED ORDER — MORPHINE SULFATE (PF) 2 MG/ML IV SOLN
INTRAVENOUS | Status: AC
  Administered 2021-09-02: 2 mg via INTRAVENOUS
  Filled 2021-09-02: qty 1

## 2021-09-02 MED ORDER — MORPHINE SULFATE (PF) 4 MG/ML IV SOLN: 2.0000 mg | INTRAVENOUS | Status: DC | PRN

## 2021-09-02 MED ORDER — METHYLPREDNISOLONE SODIUM SUCC 125 MG IJ SOLR: 125.0000 mg | Freq: Once | INTRAMUSCULAR | Status: DC | PRN

## 2021-09-02 MED ORDER — MIDAZOLAM HCL 2 MG/2ML IJ SOLN
INTRAMUSCULAR | Status: DC | PRN
  Administered 2021-09-02: 1 mg via INTRAVENOUS
  Administered 2021-09-02: 2 mg via INTRAVENOUS

## 2021-09-02 MED ORDER — MORPHINE SULFATE (PF) 2 MG/ML IV SOLN
INTRAVENOUS | Status: AC
  Filled 2021-09-02: qty 1

## 2021-09-02 MED ORDER — SODIUM CHLORIDE 0.9% FLUSH
3.0000 mL | Freq: Two times a day (BID) | INTRAVENOUS | Status: DC
  Administered 2021-09-03 – 2021-09-04 (×3): 3 mL via INTRAVENOUS

## 2021-09-02 MED ORDER — ONDANSETRON HCL 4 MG/2ML IJ SOLN: 4.0000 mg | Freq: Four times a day (QID) | INTRAMUSCULAR | Status: DC | PRN

## 2021-09-02 MED ORDER — SODIUM CHLORIDE 0.9 % IV SOLN: INTRAVENOUS | Status: DC

## 2021-09-02 MED ORDER — FAMOTIDINE 20 MG PO TABS: 40.0000 mg | ORAL_TABLET | Freq: Once | ORAL | Status: DC | PRN

## 2021-09-02 MED ORDER — CEFAZOLIN SODIUM-DEXTROSE 2-4 GM/100ML-% IV SOLN: 2.0000 g | Freq: Once | INTRAVENOUS | Status: AC

## 2021-09-02 MED ORDER — KETOROLAC TROMETHAMINE 30 MG/ML IJ SOLN
30.0000 mg | Freq: Four times a day (QID) | INTRAMUSCULAR | Status: AC
  Administered 2021-09-02 – 2021-09-03 (×3): 30 mg via INTRAVENOUS
  Filled 2021-09-02 (×4): qty 1

## 2021-09-02 MED ORDER — FENTANYL CITRATE PF 50 MCG/ML IJ SOSY
PREFILLED_SYRINGE | INTRAMUSCULAR | Status: AC
  Filled 2021-09-02: qty 2

## 2021-09-02 MED ORDER — ACETAMINOPHEN 325 MG PO TABS
650.0000 mg | ORAL_TABLET | ORAL | Status: DC | PRN
  Administered 2021-09-03 – 2021-09-04 (×2): 650 mg via ORAL
  Filled 2021-09-02 (×2): qty 2

## 2021-09-02 MED ORDER — HYDROMORPHONE HCL 1 MG/ML IJ SOLN
INTRAMUSCULAR | Status: AC
  Filled 2021-09-02: qty 0.5

## 2021-09-02 MED ORDER — MORPHINE SULFATE (PF) 2 MG/ML IV SOLN
2.0000 mg | Freq: Once | INTRAVENOUS | Status: AC
  Administered 2021-09-02: 2 mg via INTRAVENOUS

## 2021-09-02 MED ORDER — DIPHENHYDRAMINE HCL 50 MG/ML IJ SOLN: 50.0000 mg | Freq: Once | INTRAMUSCULAR | Status: DC | PRN

## 2021-09-02 MED ORDER — HEPARIN SODIUM (PORCINE) 1000 UNIT/ML IJ SOLN
INTRAMUSCULAR | Status: AC
  Filled 2021-09-02: qty 10

## 2021-09-02 MED ORDER — ACETAMINOPHEN 325 MG PO TABS: 650.0000 mg | ORAL_TABLET | ORAL | Status: DC | PRN

## 2021-09-02 MED ORDER — CEFAZOLIN SODIUM-DEXTROSE 2-4 GM/100ML-% IV SOLN
INTRAVENOUS | Status: AC
  Administered 2021-09-02: 2 g via INTRAVENOUS
  Filled 2021-09-02: qty 100

## 2021-09-02 MED ORDER — ONDANSETRON HCL 4 MG/2ML IJ SOLN
4.0000 mg | Freq: Four times a day (QID) | INTRAMUSCULAR | Status: DC | PRN
  Administered 2021-09-02: 4 mg via INTRAVENOUS
  Filled 2021-09-02: qty 2

## 2021-09-02 MED ORDER — OXYCODONE-ACETAMINOPHEN 5-325 MG PO TABS
ORAL_TABLET | ORAL | Status: AC
  Filled 2021-09-02: qty 2

## 2021-09-02 MED ORDER — MIDAZOLAM HCL 5 MG/5ML IJ SOLN
INTRAMUSCULAR | Status: AC
  Filled 2021-09-02: qty 5

## 2021-09-02 MED ORDER — HYDROMORPHONE HCL 1 MG/ML IJ SOLN: 1.0000 mg | Freq: Once | INTRAMUSCULAR | Status: DC | PRN

## 2021-09-02 MED ORDER — FENTANYL CITRATE (PF) 100 MCG/2ML IJ SOLN
INTRAMUSCULAR | Status: DC | PRN
  Administered 2021-09-02: 50 ug via INTRAVENOUS
  Administered 2021-09-02: 25 ug via INTRAVENOUS

## 2021-09-02 SURGICAL SUPPLY — 21 items
BLOCK BEAD 500-700 (Vascular Products) ×2 IMPLANT
CATH ANGIO 5F PIGTAIL 65CM (CATHETERS) ×2 IMPLANT
CATH BEACON 5 .035 65 KMP TIP (CATHETERS) ×2 IMPLANT
CATH MICROCATH PRGRT 2.8F 110 (CATHETERS) ×1 IMPLANT
CATH TEMPO 5F RIM 65CM (CATHETERS) ×2 IMPLANT
COIL 400 COMPLEX SOFT 6X20CM (Vascular Products) ×2 IMPLANT
COIL 400 COMPLEX SOFT 8X35CM (Vascular Products) ×2 IMPLANT
COVER PROBE U/S 5X48 (MISCELLANEOUS) ×2 IMPLANT
DEVICE STARCLOSE SE CLOSURE (Vascular Products) ×4 IMPLANT
DEVICE TORQUE (MISCELLANEOUS) ×2 IMPLANT
GLIDEWIRE ADV .014X300CM (WIRE) ×2 IMPLANT
GLIDEWIRE STIFF .35X180X3 HYDR (WIRE) ×2 IMPLANT
GUIDEWIRE ANGLED .035 180CM (WIRE) ×2 IMPLANT
HANDLE DETACHMENT COIL (MISCELLANEOUS) ×2 IMPLANT
MICROCATH PROGREAT 2.8F 110 CM (CATHETERS) ×2
NEEDLE ENTRY 21GA 7CM ECHOTIP (NEEDLE) ×2 IMPLANT
PACK ANGIOGRAPHY (CUSTOM PROCEDURE TRAY) ×2 IMPLANT
SET INTRO CAPELLA COAXIAL (SET/KITS/TRAYS/PACK) ×2 IMPLANT
SHEATH BALKIN 5.5FR (SHEATH) ×2 IMPLANT
SHEATH BRITE TIP 5FRX11 (SHEATH) ×4 IMPLANT
WIRE GUIDERIGHT .035X150 (WIRE) ×2 IMPLANT

## 2021-09-02 NOTE — Op Note (Signed)
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  Introduction catheter into left internal iliac artery second-order catheter placement left femoral approach  2.  Introduction catheter into left uterine artery third order catheter placement right femoral artery approach  3.  Embolization left uterine artery with a total of 3 cc of medium sized PVC beads  4.  Embolization of the left uterine artery with 2 Ruby coils a 6 x 20 soft and an 8 x 35 soft  5.  Ultrasound-guided access to both the right and left common femoral arteries  6.  StarClose both right and left common femoral arteries    Anesthesia: Conscious sedation was administered by the interventional radiology RN under my direct supervision. IV Versed plus fentanyl were utilized. Continuous ECG, pulse oximetry and blood pressure was monitored throughout the entire procedure. Conscious sedation was administered for a total of 54 minutes.  Sheath: 5 French sheath left common femoral artery retrograde; 5.5 [place] Balkan sheath right common femoral artery retrograde  Contrast: 55 cc   Fluoroscopy Time: 7.7 minutes  Indications: Patient is status post successful embolization of the right uterine artery.  At the time of the initial procedure access to the left inner uterine artery was not achieved.  She has continued to bleed and is now presenting for treatment of the left uterine artery.  Risks and benefits of been reviewed in the office all questions have been answered patient agrees to proceed  Procedure:  Lauren Hensley a 55 y.o. female who was identified and appropriate procedural time out was performed.  The patient was then placed  supine on the table and prepped and draped in the usual sterile fashion.  Ultrasound was used to evaluate the left common femoral artery.  It was echolucent and pulsatile indicating it is patent .  An ultrasound image was acquired for the permanent record.  A micropuncture needle was used to access the left common femoral artery under direct ultrasound guidance.  The microwire was then advanced under fluoroscopic guidance without difficulty followed by the micro-sheath.  A 0.035 J wire was advanced without resistance and a 5Fr sheath was placed.  A rim catheter was then advanced over a floppy Glidewire.  Hand-injection contrast was then performed in the mid common iliac artery.  The catheter was then used to select internal iliac artery on the left.  Next the prograde catheter was advanced through the rim down into the left internal iliac artery.  Hand-injection through the prograde catheter demonstrated the origin of the uterine artery.  Again noted is a 90 degree takeoff with a subsequent hairpin turn within millimeters of the origin.  Multiple attempts at accessing the left uterine artery with the prograde catheter and wire as well as with a 0.014 advantage wire were unsuccessful and I subsequently advanced the 014 wire distally into the internal iliac in preparation to use a balloon to block the main trunk and allow access of the uterine artery without prolapsing into the main internal iliac.  Because of this I then turned my attention to the right common femoral artery.  Ultrasound was returned to the sterile field.  Ultrasound was used to evaluate the right common femoral artery.  It was echolucent and pulsatile indicating it is patent .  An ultrasound image was acquired for the permanent record.  A micropuncture needle was used to access the left common femoral artery under direct ultrasound guidance.  The microwire was then advanced under fluoroscopic guidance without difficulty followed by the  micro-sheath.  A 0.035 J wire was advanced without resistance and a 5Fr sheath was placed.  Rim catheter and stiff angled Glidewire were then advanced up to the bifurcation the bifurcation was selected and then the stiff angle Glidewire negotiated across the aortic bifurcation and into the internal iliac.  Once proper purchase had been achieved a 5.5 [place] Balkan sheath was advanced up and over the aortic bifurcation from the right groin and position with the tip of the sheath approximately 3 cm into the left internal iliac.  I then selected a Kumpe catheter and performed magnified imaging of the left internal from the right side in a very steep almost lateral view.  I then used a Kumpe catheter to engage the ostia of the uterine artery.  The Glidewire would not track across the hairpin turn similar to my findings at the previous attempt however, on this occasion the prograde catheter advanced with the support of the Kumpe catheter and I was able to negotiate the prograde well into the uterine artery 8 to 10 cm.  I was then able to get the Kumpe catheter to track about 4 to 5 cm into the uterine artery.  Having achieved a stable platform for intervention I move forward with the procedure.  Hand-injection of contrast and a magnified image in a slightly less steep oblique was then performed and this verified the uterine artery and its anatomy  I began with 2 cc of the medium sized PVC beads 500 to 700 m in size.  Follow-up imaging demonstrated a significant decrease in flow.  I then added 1 cc more of the medium sized PVC beads.  Again, follow-up imaging demonstrated further reduction in flow but the main right uterine artery remained patent therefore positioned the prograde in the mid to distal right uterine artery and deployed a single 6 mm x 20 mm Ruby coil.  Followed by an 8 mm x 35 cm soft Ruby coil.  Follow-up imaging from the Kumpe catheter in the proximal left uterine artery now demonstrated excellent flow  through the main trunk as well as the 2 large pelvic branches.  There is now virtually no visualization of the left uterine artery.    Findings:  Initial imaging from previous angio showed the distal aorta demonstrated normal aortic bifurcation with normal common internal and external iliac artery anatomy.  Even on the initial AP view very large tortuous uterine arteries are identified bilaterally.  Selective injection of the left uterine artery demonstrates the enlarged uterus in association with the blushes from the fibroids.  Following embolization as described above there is now no flow in the left uterine artery.  Successful left uterine artery embolization    Disposition: Patient was taken to the recovery room in stable condition having tolerated the procedure well.  Belenda Cruise Ily Denno 09/02/2021,12:04 PM

## 2021-09-02 NOTE — Interval H&P Note (Signed)
History and Physical Interval Note:  09/02/2021 9:20 AM  Lauren Hensley  has presented today for surgery, with the diagnosis of LT Uterine Embolization   Vaginal bleeding  Uterine fibroids.  The various methods of treatment have been discussed with the patient and family. After consideration of risks, benefits and other options for treatment, the patient has consented to  Procedure(s): EMBOLIZATION (N/A) as a surgical intervention.  The patient's history has been reviewed, patient examined, no change in status, stable for surgery.  I have reviewed the patient's chart and labs.  Questions were answered to the patient's satisfaction.     [name]

## 2021-09-03 ENCOUNTER — Encounter: Payer: Self-pay | Admitting: Vascular Surgery

## 2021-09-03 ENCOUNTER — Other Ambulatory Visit: Payer: Self-pay | Admitting: *Deleted

## 2021-09-03 DIAGNOSIS — D649 Anemia, unspecified: Secondary | ICD-10-CM

## 2021-09-03 DIAGNOSIS — N938 Other specified abnormal uterine and vaginal bleeding: Principal | ICD-10-CM

## 2021-09-03 DIAGNOSIS — D500 Iron deficiency anemia secondary to blood loss (chronic): Secondary | ICD-10-CM

## 2021-09-03 LAB — BASIC METABOLIC PANEL
Anion gap: 3 — ABNORMAL LOW (ref 5–15)
BUN: 16 mg/dL (ref 6–20)
CO2: 23 mmol/L (ref 22–32)
Calcium: 8.3 mg/dL — ABNORMAL LOW (ref 8.9–10.3)
Chloride: 106 mmol/L (ref 98–111)
Creatinine, Ser: 0.79 mg/dL (ref 0.44–1.00)
GFR, Estimated: >60 mL/min (ref >60)
Glucose, Bld: 91 mg/dL (ref 70–99)
Potassium: 4 mmol/L (ref 3.5–5.1)
Sodium: 132 mmol/L — ABNORMAL LOW (ref 135–145)

## 2021-09-03 LAB — CBC
HCT: 21.8 % — ABNORMAL LOW (ref 36.0–46.0)
Hemoglobin: 6.5 g/dL — ABNORMAL LOW (ref 12.0–15.0)
MCH: 21.7 pg — ABNORMAL LOW (ref 26.0–34.0)
MCHC: 29.8 g/dL — ABNORMAL LOW (ref 30.0–36.0)
MCV: 72.9 fL — ABNORMAL LOW (ref 80.0–100.0)
Platelets: 176 10*3/uL (ref 150–400)
RBC: 2.99 MIL/uL — ABNORMAL LOW (ref 3.87–5.11)
RDW: 18.4 % — ABNORMAL HIGH (ref 11.5–15.5)
WBC: 4.6 10*3/uL (ref 4.0–10.5)
nRBC: 0 % (ref 0.0–0.2)

## 2021-09-03 LAB — HEMOGLOBIN AND HEMATOCRIT, BLOOD
HCT: 28 % — ABNORMAL LOW (ref 36.0–46.0)
Hemoglobin: 8.9 g/dL — ABNORMAL LOW (ref 12.0–15.0)

## 2021-09-03 LAB — PREPARE RBC (CROSSMATCH)

## 2021-09-03 MED ORDER — SODIUM CHLORIDE 0.9% IV SOLUTION: Freq: Once | INTRAVENOUS | Status: AC

## 2021-09-03 MED ORDER — DIPHENHYDRAMINE HCL 25 MG PO CAPS
25.0000 mg | ORAL_CAPSULE | Freq: Four times a day (QID) | ORAL | Status: DC | PRN
  Administered 2021-09-03: 25 mg via ORAL
  Filled 2021-09-03: qty 1

## 2021-09-03 MED ORDER — ACETAMINOPHEN 325 MG PO TABS
650.0000 mg | ORAL_TABLET | Freq: Once | ORAL | Status: AC
  Administered 2021-09-03: 650 mg via ORAL
  Filled 2021-09-03: qty 2

## 2021-09-03 MED ORDER — FUROSEMIDE 10 MG/ML IJ SOLN
20.0000 mg | Freq: Once | INTRAMUSCULAR | Status: AC
  Administered 2021-09-03: 20 mg via INTRAVENOUS
  Filled 2021-09-03: qty 4

## 2021-09-03 MED ORDER — DIPHENHYDRAMINE HCL 50 MG/ML IJ SOLN
25.0000 mg | Freq: Once | INTRAMUSCULAR | Status: AC
  Administered 2021-09-03: 25 mg via INTRAVENOUS
  Filled 2021-09-03: qty 1

## 2021-09-03 MED ORDER — KETOROLAC TROMETHAMINE 30 MG/ML IJ SOLN
30.0000 mg | Freq: Four times a day (QID) | INTRAMUSCULAR | Status: AC
  Administered 2021-09-03 – 2021-09-04 (×4): 30 mg via INTRAVENOUS
  Filled 2021-09-03 (×4): qty 1

## 2021-09-03 NOTE — Progress Notes (Signed)
[facility] Daily Progress Note  09/02/21:             1.  Introduction catheter into left internal iliac artery second-order catheter placement left femoral approach             2.  Introduction catheter into left uterine artery third order catheter placement right femoral artery approach             3.  Embolization left uterine artery with a total of 3 cc of medium sized PVC beads             4.  Embolization of the left uterine artery with 2 Ruby coils a 6 x 20 soft and an 8 x 35 soft             5.  Ultrasound-guided access to both the right and left common femoral arteries             6.  StarClose both right and left common femoral arteries  Subjective: Patient c/o continued pain and menstrual cramps throughout the evening and into today. No acute issues overnight.    Objective: Vitals:   09/03/21 1218 09/03/21 1307 09/03/21 1320 09/03/21 1328  BP: 113/61 (!) 122/56 134/73 134/68  Pulse: (!) 57 (!) 55 (!) 59 60  Resp: 18 18 18 20   Temp: 98.1 F (36.7 C) 97.6 F (36.4 C) 97.6 F (36.4 C) 97.7 F (36.5 C)  TempSrc: Oral Axillary Axillary Oral  SpO2: 100% 100% 100% 100%  Weight:      Height:        Intake/Output Summary (Last 24 hours) at 09/03/2021 1441 Last data filed at 09/03/2021 1356 Gross per 24 hour  Intake 2016.94 ml  Output --  Net 2016.94 ml   Physical Exam: A&Ox3, NAD CV: RRR Pulmonary: CTA Bilaterally Abdomen: Soft, Nontender, Nondistended Right Groin:  Access Site: clean and dry. No swelling or drainage.  Vascular: Warm distally to toes   Laboratory: CBC    Component Value Date/Time   WBC 4.6 09/03/2021 0533   HGB 6.5 (L) 09/03/2021 0533   HCT 21.8 (L) 09/03/2021 0533   PLT 176 09/03/2021 0533   BMET    Component Value Date/Time   NA 132 (L) 09/03/2021 0533   K 4.0 09/03/2021 0533   CL 106 09/03/2021 0533   CO2 23 09/03/2021 0533   GLUCOSE 91 09/03/2021 0533   BUN 16 09/03/2021 0533   CREATININE 0.79 09/03/2021 0533    CALCIUM 8.3 (L) 09/03/2021 0533   GFRNONAA >60 09/03/2021 0533   Assessment/Planning: The patient is 55 year old female s/p uterine embolization - POD#1  1) Hbg is 6.5 this AM. Will transfuse two units PRBC. Post-transfusion CBC pending. 2) Continued but expected pain due to the nature of the procedure. 3) OK to ambulate 4) d/c home tomorrow  Discussed with Dr. Eber Hong Jaleah Lefevre PA-C 09/03/2021 2:41 PM

## 2021-09-03 NOTE — Plan of Care (Signed)
  Problem: Clinical Measurements: Goal: Ability to maintain clinical measurements within normal limits will improve Outcome: Progressing Goal: Will remain free from infection Outcome: Progressing Goal: Diagnostic test results will improve Outcome: Progressing Goal: Respiratory complications will improve Outcome: Progressing Goal: Cardiovascular complication will be avoided Outcome: Progressing   Problem: Safety: Goal: Ability to remain free from injury will improve Outcome: Progressing   Pt is involved in and agrees with the plan of care,

## 2021-09-04 LAB — BPAM RBC
Blood Product Expiration Date: 202301072359
Blood Product Expiration Date: 202301072359
ISSUE DATE / TIME: 202212071258
ISSUE DATE / TIME: 202212071712
Unit Type and Rh: 7300
Unit Type and Rh: 7300

## 2021-09-04 LAB — TYPE AND SCREEN
ABO/RH(D): B POS
Antibody Screen: NEGATIVE
Unit division: 0
Unit division: 0

## 2021-09-04 LAB — CBC
HCT: 26.6 % — ABNORMAL LOW (ref 36.0–46.0)
Hemoglobin: 8.3 g/dL — ABNORMAL LOW (ref 12.0–15.0)
MCH: 22.7 pg — ABNORMAL LOW (ref 26.0–34.0)
MCHC: 31.2 g/dL (ref 30.0–36.0)
MCV: 72.9 fL — ABNORMAL LOW (ref 80.0–100.0)
Platelets: 177 10*3/uL (ref 150–400)
RBC: 3.65 MIL/uL — ABNORMAL LOW (ref 3.87–5.11)
RDW: 20.2 % — ABNORMAL HIGH (ref 11.5–15.5)
WBC: 6 10*3/uL (ref 4.0–10.5)
nRBC: 0 % (ref 0.0–0.2)

## 2021-09-04 LAB — BASIC METABOLIC PANEL
Anion gap: 4 — ABNORMAL LOW (ref 5–15)
BUN: 19 mg/dL (ref 6–20)
CO2: 22 mmol/L (ref 22–32)
Calcium: 8.2 mg/dL — ABNORMAL LOW (ref 8.9–10.3)
Chloride: 108 mmol/L (ref 98–111)
Creatinine, Ser: 1.01 mg/dL — ABNORMAL HIGH (ref 0.44–1.00)
GFR, Estimated: >60 mL/min (ref >60)
Glucose, Bld: 95 mg/dL (ref 70–99)
Potassium: 4 mmol/L (ref 3.5–5.1)
Sodium: 134 mmol/L — ABNORMAL LOW (ref 135–145)

## 2021-09-04 MED ORDER — OXYCODONE HCL 5 MG PO TABS: 5.0000 mg | ORAL_TABLET | Freq: Four times a day (QID) | ORAL | 0 refills | Status: DC | PRN

## 2021-09-04 NOTE — Discharge Instructions (Signed)
You may shower. Keep you groins clean and dry.  Please do not lift greater than 10 pounds for two weeks.

## 2021-09-04 NOTE — Plan of Care (Signed)
VSS, patient alert and oriented.  Discharge instructions reviewed, IV removed, and patient discharged to home with brother.

## 2021-09-04 NOTE — Discharge Summary (Signed)
[facility]    Discharge Summary  Patient ID:  Lauren Hensley MRN: [id number] DOB/AGE: [birth year] 55 y.o.  Admit date: 09/02/2021 Discharge date: 09/04/2021 Date of Surgery: 09/02/2021 Surgeon: Surgeon(s): Schnier, Dolores Lory, MD  Admission Diagnosis: Uterine bleeding, dysfunctional [N93.8]  Discharge Diagnoses:  Uterine bleeding, dysfunctional [N93.8]  Secondary Diagnoses: Past Medical History:  Diagnosis Date   Anemia    Hypertension    Procedure(s): 09/02/21:             1.  Introduction catheter into left internal iliac artery second-order catheter placement left femoral approach             2.  Introduction catheter into left uterine artery third order catheter placement right femoral artery approach             3.  Embolization left uterine artery with a total of 3 cc of medium sized PVC beads             4.  Embolization of the left uterine artery with 2 Ruby coils a 6 x 20 soft and an 8 x 35 soft             5.  Ultrasound-guided access to both the right and left common femoral arteries             6.  StarClose both right and left common femoral arteries  Discharged Condition: Good  HPI / Hospital Course:  Patient is status post successful embolization of the right uterine artery.  At the time of the initial procedure access to the left inner uterine artery was not achieved.  She has continued to bleed and is now presenting for treatment of the left uterine artery.  Risks and benefits of been reviewed in the office all questions have been answered patient agrees to proceed. On 09/02/21, the patient underwent:              1.  Introduction catheter into left internal iliac artery second-order catheter placement left femoral approach             2.  Introduction catheter into left uterine artery third order catheter placement right femoral artery approach             3.  Embolization left uterine artery with a total of 3 cc of medium sized PVC  beads             4.  Embolization of the left uterine artery with 2 Ruby coils a 6 x 20 soft and an 8 x 35 soft             5.  Ultrasound-guided access to both the right and left common femoral arteries             6.  StarClose both right and left common femoral arteries  The patient tolerated the procedure and was transferred from the angiography suite to the surgical floor without issue.  Patient's night of surgery was unremarkable.  Patient received 2 units of packed red blood cells on postop day #1 for a mixture of menorrhagia secondary to uterine fibroids.  Follow-up CBC was appropriate.  During her brief stay, her diet was advanced, she was urinating on her own, her discomfort was controlled through the use of p.o. pain medication and she was ambulating at baseline.  Day of discharge she was afebrile with stable vital signs.  Physical Exam:  Alert and oriented x3, no acute distress Cardiovascular: Regular rate  and rhythm Pulmonary: Clear to auscultation bilaterally Abdomen: Nontender, nondistended, positive bowel sounds Right groin: Access site clean dry and intact Extremity: Warm distally to toes  Labs: As below  Complications: None  Consults: None  Significant Diagnostic Studies: CBC Lab Results  Component Value Date   WBC 6.0 09/04/2021   HGB 8.3 (L) 09/04/2021   HCT 26.6 (L) 09/04/2021   MCV 72.9 (L) 09/04/2021   PLT 177 09/04/2021   BMET    Component Value Date/Time   NA 134 (L) 09/04/2021 0354   K 4.0 09/04/2021 0354   CL 108 09/04/2021 0354   CO2 22 09/04/2021 0354   GLUCOSE 95 09/04/2021 0354   BUN 19 09/04/2021 0354   CREATININE 1.01 (H) 09/04/2021 0354   CALCIUM 8.2 (L) 09/04/2021 0354   GFRNONAA >60 09/04/2021 0354   COAG Lab Results  Component Value Date   INR 1.3 (H) 02/01/2021   Disposition:  Discharge to :Home  Allergies as of 09/04/2021   No Known Allergies      Medication List     TAKE these medications    medroxyPROGESTERone 10  MG tablet Commonly known as: PROVERA Take 2 tablets (20 mg total) by mouth 3 (three) times daily.   oxyCODONE 5 MG immediate release tablet Commonly known as: Oxy IR/ROXICODONE Take 1-2 tablets (5-10 mg total) by mouth every 6 (six) hours as needed for moderate pain or severe pain.       Verbal and written Discharge instructions given to the patient. Wound care per Discharge AVS  Follow-up Information     Schnier, Dolores Lory, MD Follow up in 1 month(s).   Specialties: Vascular Surgery, Cardiology, Radiology, Vascular Surgery Why: Can see Schnier or Arna Medici. Embolization follow up. No studies. Contact information: [place] [address] [phone]                Signed: Sela Hua, PA-C 09/04/2021, 2:17 PM

## 2021-09-04 NOTE — TOC Transition Note (Signed)
Transition of Care [facility]) - CM/SW Discharge Note   Patient Details  Name: Lauren Hensley MRN: [id number] Date of Birth: [birth year]  [facility] CM/SW Contact:  Candie Chroman, LCSW Phone Number: 09/04/2021, 2:30 PM   Clinical Narrative:   Patient has orders to discharge home today. CSW met with patient. No supports at bedside. CSW introduced role and inquired about patient not having a PCP. Patient confirmed. Gave packet for free/low-cost healthcare in [facility] and intake paperwork for [facility]. Per AVS, no changes made to medications. No further concerns. Patient's ride will be here around 6:00. CSW signing off.  Final next level of care: Home/Self Care Barriers to Discharge: No Barriers Identified   Patient Goals and CMS Choice        Discharge Placement                Patient to be transferred to facility by: Family will pick her up around 6:00   Patient and family notified of of transfer: 09/04/21  Discharge Plan and Services     Post Acute Care Choice: NA                               Social Determinants of Health (SDOH) Interventions     Readmission Risk Interventions No flowsheet data found.

## 2021-09-08 ENCOUNTER — Inpatient Hospital Stay: Payer: Self-pay

## 2021-09-08 ENCOUNTER — Other Ambulatory Visit: Payer: Self-pay

## 2021-09-08 ENCOUNTER — Encounter: Payer: Self-pay | Admitting: Nurse Practitioner

## 2021-09-08 ENCOUNTER — Inpatient Hospital Stay: Payer: Self-pay | Attending: Nurse Practitioner | Admitting: Nurse Practitioner

## 2021-09-08 VITALS — BP 135/76 | HR 72 | Temp 96.7°F | Wt 307.7 lb

## 2021-09-08 DIAGNOSIS — Z836 Family history of other diseases of the respiratory system: Secondary | ICD-10-CM | POA: Insufficient documentation

## 2021-09-08 DIAGNOSIS — N920 Excessive and frequent menstruation with regular cycle: Secondary | ICD-10-CM | POA: Insufficient documentation

## 2021-09-08 DIAGNOSIS — M2550 Pain in unspecified joint: Secondary | ICD-10-CM | POA: Insufficient documentation

## 2021-09-08 DIAGNOSIS — D500 Iron deficiency anemia secondary to blood loss (chronic): Secondary | ICD-10-CM

## 2021-09-08 DIAGNOSIS — R42 Dizziness and giddiness: Secondary | ICD-10-CM | POA: Insufficient documentation

## 2021-09-08 DIAGNOSIS — Z833 Family history of diabetes mellitus: Secondary | ICD-10-CM | POA: Insufficient documentation

## 2021-09-08 DIAGNOSIS — D649 Anemia, unspecified: Secondary | ICD-10-CM

## 2021-09-08 DIAGNOSIS — D563 Thalassemia minor: Secondary | ICD-10-CM | POA: Insufficient documentation

## 2021-09-08 DIAGNOSIS — R5383 Other fatigue: Secondary | ICD-10-CM | POA: Insufficient documentation

## 2021-09-08 DIAGNOSIS — Z79899 Other long term (current) drug therapy: Secondary | ICD-10-CM | POA: Insufficient documentation

## 2021-09-08 DIAGNOSIS — Z8261 Family history of arthritis: Secondary | ICD-10-CM | POA: Insufficient documentation

## 2021-09-08 DIAGNOSIS — Z8349 Family history of other endocrine, nutritional and metabolic diseases: Secondary | ICD-10-CM | POA: Insufficient documentation

## 2021-09-08 LAB — IRON AND TIBC
Iron: 45 ug/dL (ref 28–170)
Saturation Ratios: 12 % (ref 10.4–31.8)
TIBC: 377 ug/dL (ref 250–450)
UIBC: 332 ug/dL

## 2021-09-08 LAB — CBC WITH DIFFERENTIAL/PLATELET
Abs Immature Granulocytes: 0.01 10*3/uL (ref 0.00–0.07)
Basophils Absolute: 0.1 10*3/uL (ref 0.0–0.1)
Basophils Relative: 2 %
Eosinophils Absolute: 0.1 10*3/uL (ref 0.0–0.5)
Eosinophils Relative: 2 %
HCT: 30.2 % — ABNORMAL LOW (ref 36.0–46.0)
Hemoglobin: 9.2 g/dL — ABNORMAL LOW (ref 12.0–15.0)
Immature Granulocytes: 0 %
Lymphocytes Relative: 37 %
Lymphs Abs: 1.7 10*3/uL (ref 0.7–4.0)
MCH: 22.5 pg — ABNORMAL LOW (ref 26.0–34.0)
MCHC: 30.5 g/dL (ref 30.0–36.0)
MCV: 74 fL — ABNORMAL LOW (ref 80.0–100.0)
Monocytes Absolute: 0.3 10*3/uL (ref 0.1–1.0)
Monocytes Relative: 7 %
Neutro Abs: 2.4 10*3/uL (ref 1.7–7.7)
Neutrophils Relative %: 52 %
Platelets: 204 10*3/uL (ref 150–400)
RBC: 4.08 MIL/uL (ref 3.87–5.11)
RDW: 19.9 % — ABNORMAL HIGH (ref 11.5–15.5)
WBC: 4.6 10*3/uL (ref 4.0–10.5)
nRBC: 0 % (ref 0.0–0.2)

## 2021-09-08 LAB — FERRITIN: Ferritin: 21 ng/mL (ref 11–307)

## 2021-09-08 LAB — SAMPLE TO BLOOD BANK

## 2021-09-08 MED ORDER — SODIUM CHLORIDE 0.9 % IV SOLN
Freq: Once | INTRAVENOUS | Status: AC
  Filled 2021-09-08: qty 250

## 2021-09-08 MED ORDER — IRON SUCROSE 20 MG/ML IV SOLN
200.0000 mg | Freq: Once | INTRAVENOUS | Status: AC
  Administered 2021-09-08: 200 mg via INTRAVENOUS
  Filled 2021-09-08: qty 10

## 2021-09-08 MED ORDER — SODIUM CHLORIDE 0.9 % IV SOLN: 200.0000 mg | Freq: Once | INTRAVENOUS | Status: DC

## 2021-09-08 NOTE — Progress Notes (Signed)
Pt states had a procedure done 09/02/2021 for emobolization at [facility].

## 2021-09-08 NOTE — Patient Instructions (Signed)
[facility] [place]  Discharge Instructions: Thank you for choosing [facility] to provide your oncology and hematology care.  If you have a lab appointment with the [facility], please go directly to the [facility] and check in at the registration area.  Wear comfortable clothing and clothing appropriate for easy access to any Portacath or PICC line.   We strive to give you quality time with your provider. You may need to reschedule your appointment if you arrive late (15 or more minutes).  Arriving late affects you and other patients whose appointments are after yours.  Also, if you miss three or more appointments without notifying the office, you may be dismissed from the clinic at the provider's discretion.      For prescription refill requests, have your pharmacy contact our office and allow 72 hours for refills to be completed.    Today you received the following : Venofer   To help prevent nausea and vomiting after your treatment, we encourage you to take your nausea medication as directed.  BELOW ARE SYMPTOMS THAT SHOULD BE REPORTED IMMEDIATELY: *FEVER GREATER THAN 100.4 F (38 C) OR HIGHER *CHILLS OR SWEATING *NAUSEA AND VOMITING THAT IS NOT CONTROLLED WITH YOUR NAUSEA MEDICATION *UNUSUAL SHORTNESS OF BREATH *UNUSUAL BRUISING OR BLEEDING *URINARY PROBLEMS (pain or burning when urinating, or frequent urination) *BOWEL PROBLEMS (unusual diarrhea, constipation, pain near the anus) TENDERNESS IN MOUTH AND THROAT WITH OR WITHOUT PRESENCE OF ULCERS (sore throat, sores in mouth, or a toothache) UNUSUAL RASH, SWELLING OR PAIN  UNUSUAL VAGINAL DISCHARGE OR ITCHING   Items with * indicate a potential emergency and should be followed up as soon as possible or go to the Emergency Department if any problems should occur.  Please show the CHEMOTHERAPY ALERT CARD or IMMUNOTHERAPY ALERT CARD at check-in to the Emergency Department and triage  nurse.  Should you have questions after your visit or need to cancel or reschedule your appointment, please contact [facility] [place] [facility] [place]  [phone] and follow the prompts.  Office hours are 8:00 a.m. to 4:30 p.m. Monday - Friday. Please note that voicemails left after 4:00 p.m. may not be returned until the following business day.  We are closed weekends and major holidays. You have access to a nurse at all times for urgent questions. Please call the main number to the clinic [phone] and follow the prompts.  For any non-urgent questions, you may also contact your provider using MyChart. We now offer e-Visits for anyone 53 and older to request care online for non-urgent symptoms. For details visit [email or web address].   Also download the MyChart app! Go to the app store, search "MyChart", open the app, select , and log in with your MyChart username and password.  Due to Covid, a mask is required upon entering the hospital/clinic. If you do not have a mask, one will be given to you upon arrival. For doctor visits, patients may have 1 support person aged 53 or older with them. For treatment visits, patients cannot have anyone with them due to current Covid guidelines and our immunocompromised population.

## 2021-09-08 NOTE — Progress Notes (Signed)
[facility] OFFICE PROGRESS NOTE  Patient Care Team: Homero Fellers, MD as PCP - General (Obstetrics and Gynecology) Rico Junker, RN as Registered Nurse Theodore Demark, RN as Registered Nurse   Cancer Staging  No matching staging information was found for the patient.   Oncology History   No history exists.   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   #Heavy menstrual cycles-fibroids [Dr Shuman]   INTERVAL HISTORY: Ambulating independently.  Lauren Hensley 55 y.o.  female pleasant patient history of iron deficient anemia/beta thalassemia-other history of fibroids/heavy menstrual cycle is here for follow-up. She underwent a [place] with Dr. Delana Meyer on 09/02/21. Continues to bleed but it is lighter. Continues to be tired.   Review of Systems  Constitutional:  Positive for malaise/fatigue. Negative for chills, diaphoresis, fever and weight loss.  HENT:  Negative for nosebleeds and sore throat.   Eyes:  Negative for double vision.  Respiratory:  Negative for cough, hemoptysis, sputum production, shortness of breath and wheezing.   Cardiovascular:  Negative for chest pain, palpitations, orthopnea and leg swelling.  Gastrointestinal:  Negative for abdominal pain, blood in stool, constipation, diarrhea, heartburn, melena, nausea and vomiting.  Genitourinary:  Negative for dysuria, frequency and urgency.  Musculoskeletal:  Negative for back pain and joint pain.  Skin: Negative.  Negative for itching and rash.  Neurological:  Positive for dizziness. Negative for tingling, focal weakness, weakness and headaches.  Endo/Heme/Allergies:  Does not bruise/bleed easily.  Psychiatric/Behavioral:  Negative for depression. The patient is not nervous/anxious and does not have insomnia.      PAST MEDICAL HISTORY :  Past Medical History:  Diagnosis Date   Anemia    Hypertension     PAST SURGICAL  HISTORY :   Past Surgical History:  Procedure Laterality Date   COLONOSCOPY WITH PROPOFOL N/A 02/08/2021   Procedure: COLONOSCOPY WITH PROPOFOL;  Surgeon: Lin Landsman, MD;  Location: [facility] ENDOSCOPY;  Service: Gastroenterology;  Laterality: N/A;   EMBOLIZATION N/A 07/29/2021   Procedure: EMBOLIZATION;  Surgeon: Katha Cabal, MD;  Location: [facility] CV LAB;  Service: Cardiovascular;  Laterality: N/A;   EMBOLIZATION N/A 09/02/2021   Procedure: EMBOLIZATION;  Surgeon: Katha Cabal, MD;  Location: [facility] CV LAB;  Service: Cardiovascular;  Laterality: N/A;   Emobolization  09/02/2021   ESOPHAGOGASTRODUODENOSCOPY N/A 02/08/2021   Procedure: ESOPHAGOGASTRODUODENOSCOPY (EGD);  Surgeon: Lin Landsman, MD;  Location: [facility] ENDOSCOPY;  Service: Gastroenterology;  Laterality: N/A;   GIVENS CAPSULE STUDY N/A 02/09/2021   Procedure: GIVENS CAPSULE STUDY;  Surgeon: Lesly Rubenstein, MD;  Location: [facility] ENDOSCOPY;  Service: Endoscopy;  Laterality: N/A;    FAMILY HISTORY :   Family History  Problem Relation Age of Onset   Diabetes Mother    COPD Mother    Heart Problems Mother    Emphysema Father    Hypercholesterolemia Brother    Diabetes Maternal Aunt    Obesity Maternal Aunt    Arthritis Maternal Aunt     SOCIAL HISTORY:   Social History   Tobacco Use   Smoking status: Never   Smokeless tobacco: Never  Vaping Use   Vaping Use: Never used  Substance Use Topics   Alcohol use: Not Currently    Comment: 6-7 months ago   Drug use: Not Currently    Types: Marijuana, Cocaine    ALLERGIES:  has No Known Allergies.  MEDICATIONS:  Current Outpatient Medications  Medication Sig Dispense  Refill   oxyCODONE (OXY IR/ROXICODONE) 5 MG immediate release tablet Take 1-2 tablets (5-10 mg total) by mouth every 6 (six) hours as needed for moderate pain or severe pain. 50 tablet 0   medroxyPROGESTERone (PROVERA) 10 MG tablet Take 2 tablets (20 mg total) by mouth 3  (three) times daily. (Patient not taking: Reported on 09/08/2021) 30 tablet 3   No current facility-administered medications for this visit.    PHYSICAL EXAMINATION:  BP 135/76 (BP Location: Left Arm, Patient Position: Sitting, Cuff Size: Large)   Pulse 72   Temp (!) 96.7 F (35.9 C) (Tympanic)   Wt (!) 307 lb 11.2 oz (139.6 kg)   SpO2 100%   BMI 48.19 kg/m   Filed Weights   09/08/21 1402  Weight: (!) 307 lb 11.2 oz (139.6 kg)    Physical Exam Vitals and nursing note reviewed.  HENT:     Head: Normocephalic and atraumatic.     Mouth/Throat:     Pharynx: Oropharynx is clear.  Eyes:     Extraocular Movements: Extraocular movements intact.     Pupils: Pupils are equal, round, and reactive to light.  Cardiovascular:     Rate and Rhythm: Normal rate and regular rhythm.  Pulmonary:     Comments: Decreased breath sounds bilaterally.  Abdominal:     Palpations: Abdomen is soft.  Musculoskeletal:        General: Normal range of motion.     Cervical back: Normal range of motion.  Skin:    General: Skin is warm.  Neurological:     General: No focal deficit present.     Mental Status: She is alert and oriented to person, place, and time.  Psychiatric:        Behavior: Behavior normal.        Judgment: Judgment normal.       LABORATORY DATA:  I have reviewed the data as listed    Component Value Date/Time   NA 134 (L) 09/04/2021 0354   K 4.0 09/04/2021 0354   CL 108 09/04/2021 0354   CO2 22 09/04/2021 0354   GLUCOSE 95 09/04/2021 0354   BUN 19 09/04/2021 0354   CREATININE 1.01 (H) 09/04/2021 0354   CALCIUM 8.2 (L) 09/04/2021 0354   PROT 7.3 03/13/2021 1519   ALBUMIN 3.9 03/13/2021 1519   AST 15 03/13/2021 1519   ALT 11 03/13/2021 1519   ALKPHOS 33 (L) 03/13/2021 1519   BILITOT 0.9 03/13/2021 1519   GFRNONAA >60 09/04/2021 0354    No results found for: SPEP, UPEP  Lab Results  Component Value Date   WBC 4.6 09/08/2021   NEUTROABS 2.4 09/08/2021   HGB  9.2 (L) 09/08/2021   HCT 30.2 (L) 09/08/2021   MCV 74.0 (L) 09/08/2021   PLT 204 09/08/2021      Chemistry      Component Value Date/Time   NA 134 (L) 09/04/2021 0354   K 4.0 09/04/2021 0354   CL 108 09/04/2021 0354   CO2 22 09/04/2021 0354   BUN 19 09/04/2021 0354   CREATININE 1.01 (H) 09/04/2021 0354      Component Value Date/Time   CALCIUM 8.2 (L) 09/04/2021 0354   ALKPHOS 33 (L) 03/13/2021 1519   AST 15 03/13/2021 1519   ALT 11 03/13/2021 1519   BILITOT 0.9 03/13/2021 1519     Iron/TIBC/Ferritin/ %Sat    Component Value Date/Time   IRON 45 09/08/2021 1343   TIBC 377 09/08/2021 1343   FERRITIN 21 09/08/2021  1343   IRONPCTSAT 12 09/08/2021 1343     RADIOGRAPHIC STUDIES: I have personally reviewed the radiological images as listed and agreed with the findings in the report. No results found.   ASSESSMENT & PLAN:  No problem-specific Assessment & Plan notes found for this encounter. #Microcytic anemia -/B12 thalassemia minor however predominant iron deficient anemia secondary to heavy menstrual cycles [see below]; symptomatic.  Hemoglobin today 9.2. Ferritin and iron studies pending at time of visit but are improved with ferritin of 21 and iron sat of 12%. Plan for venofer x   4. If she has heavy bleeding advised to rtc sooner.    #Menorrhagia-fibroids s/p gynecology evaluation. s/p embolization with Dr. Delana Meyer. Menorrhagia managed by Dr. Gilman Schmidt. Per patient, menses have improved but still requires pad every 3 hours.    # DISPOSITION Venofer today Weekly Venofer x3 (total of 4 including today) 3 months lab/MD/poss Venofer  No orders of the defined types were placed in this encounter.  All questions were answered. The patient knows to call the clinic with any problems, questions or concerns.   Verlon Au, NP 09/08/2021

## 2021-09-15 ENCOUNTER — Other Ambulatory Visit: Payer: Self-pay

## 2021-09-15 ENCOUNTER — Inpatient Hospital Stay: Payer: Self-pay

## 2021-09-15 NOTE — Progress Notes (Signed)
Multiple unsuccessful attempts for IV site by 3 RN's.  Will reschedule pt for later in the week.  Pt verbalized understanding.  Emotional support given.

## 2021-09-15 NOTE — Patient Instructions (Signed)
[facility] CANCER CTR AT [place]  Discharge Instructions: Thank you for choosing [facility] to provide your oncology and hematology care.  If you have a lab appointment with the [facility], please go directly to the [facility] and check in at the registration area.  Wear comfortable clothing and clothing appropriate for easy access to any Portacath or PICC line.   We strive to give you quality time with your provider. You may need to reschedule your appointment if you arrive late (15 or more minutes).  Arriving late affects you and other patients whose appointments are after yours.  Also, if you miss three or more appointments without notifying the office, you may be dismissed from the clinic at the providers discretion.      For prescription refill requests, have your pharmacy contact our office and allow 72 hours for refills to be completed.    Today you received the following chemotherapy and/or immunotherapy agents VENOFER      To help prevent nausea and vomiting after your treatment, we encourage you to take your nausea medication as directed.  BELOW ARE SYMPTOMS THAT SHOULD BE REPORTED IMMEDIATELY: *FEVER GREATER THAN 100.4 F (38 C) OR HIGHER *CHILLS OR SWEATING *NAUSEA AND VOMITING THAT IS NOT CONTROLLED WITH YOUR NAUSEA MEDICATION *UNUSUAL SHORTNESS OF BREATH *UNUSUAL BRUISING OR BLEEDING *URINARY PROBLEMS (pain or burning when urinating, or frequent urination) *BOWEL PROBLEMS (unusual diarrhea, constipation, pain near the anus) TENDERNESS IN MOUTH AND THROAT WITH OR WITHOUT PRESENCE OF ULCERS (sore throat, sores in mouth, or a toothache) UNUSUAL RASH, SWELLING OR PAIN  UNUSUAL VAGINAL DISCHARGE OR ITCHING   Items with * indicate a potential emergency and should be followed up as soon as possible or go to the Emergency Department if any problems should occur.  Please show the CHEMOTHERAPY ALERT CARD or IMMUNOTHERAPY ALERT CARD at check-in to the  Emergency Department and triage nurse.  Should you have questions after your visit or need to cancel or reschedule your appointment, please contact [facility] CANCER [place] AT [place]  [phone] and follow the prompts.  Office hours are 8:00 a.m. to 4:30 p.m. Monday - Friday. Please note that voicemails left after 4:00 p.m. may not be returned until the following business day.  We are closed weekends and major holidays. You have access to a nurse at all times for urgent questions. Please call the main number to the clinic [phone] and follow the prompts.  For any non-urgent questions, you may also contact your provider using MyChart. We now offer e-Visits for anyone 64 and older to request care online for non-urgent symptoms. For details visit [email or web address].   Also download the MyChart app! Go to the app store, search "MyChart", open the app, select [facility], and log in with your MyChart username and password.   Iron Sucrose Injection What is this medication? IRON SUCROSE (EYE ern SOO krose) treats low levels of iron (iron deficiency anemia) in people with kidney disease. Iron is a mineral that plays an important role in making red blood cells, which carry oxygen from your lungs to the rest of your body. This medicine may be used for other purposes; ask your health care provider or pharmacist if you have questions. COMMON BRAND NAME(S): Venofer What should I tell my care team before I take this medication? They need to know if you have any of these conditions: Anemia not caused by low iron levels Heart disease High levels of iron in the blood Kidney  disease Liver disease An unusual or allergic reaction to iron, other medications, foods, dyes, or preservatives Pregnant or trying to get pregnant Breast-feeding How should I use this medication? This medication is for infusion into a vein. It is given in a hospital or clinic setting. Talk to your care team about the  use of this medication in children. While this medication may be prescribed for children as young as 2 years for selected conditions, precautions do apply. Overdosage: If you think you have taken too much of this medicine contact a poison control center or emergency room at once. NOTE: This medicine is only for you. Do not share this medicine with others. What if I miss a dose? It is important not to miss your dose. Call your care team if you are unable to keep an appointment. What may interact with this medication? Do not take this medication with any of the following: Deferoxamine Dimercaprol Other iron products This medication may also interact with the following: Chloramphenicol Deferasirox This list may not describe all possible interactions. Give your health care provider a list of all the medicines, herbs, non-prescription drugs, or dietary supplements you use. Also tell them if you smoke, drink alcohol, or use illegal drugs. Some items may interact with your medicine. What should I watch for while using this medication? Visit your care team regularly. Tell your care team if your symptoms do not start to get better or if they get worse. You may need blood work done while you are taking this medication. You may need to follow a special diet. Talk to your care team. Foods that contain iron include: whole grains/cereals, dried fruits, beans, or peas, leafy green vegetables, and organ meats (liver, kidney). What side effects may I notice from receiving this medication? Side effects that you should report to your care team as soon as possible: Allergic reactions--skin rash, itching, hives, swelling of the face, lips, tongue, or throat Low blood pressure--dizziness, feeling faint or lightheaded, blurry vision Shortness of breath Side effects that usually do not require medical attention (report to your care team if they continue or are bothersome): Flushing Headache Joint pain Muscle  pain Nausea Pain, redness, or irritation at injection site This list may not describe all possible side effects. Call your doctor for medical advice about side effects. You may report side effects to FDA at 1-800-FDA-1088. Where should I keep my medication? This medication is given in a hospital or clinic and will not be stored at home. NOTE: This sheet is a summary. It may not cover all possible information. If you have questions about this medicine, talk to your doctor, pharmacist, or health care provider.  2022 Elsevier/Gold Standard (2021-02-07 00:00:00)  Due to Covid, a mask is required upon entering the hospital/clinic. If you do not have a mask, one will be given to you upon arrival. For doctor visits, patients may have 1 support person aged 57 or older with them. For treatment visits, patients cannot have anyone with them due to current Covid guidelines and our immunocompromised population.

## 2021-09-18 ENCOUNTER — Inpatient Hospital Stay: Payer: Self-pay

## 2021-09-18 ENCOUNTER — Other Ambulatory Visit: Payer: Self-pay

## 2021-09-18 VITALS — BP 148/76 | HR 59 | Temp 97.4°F | Resp 18

## 2021-09-18 DIAGNOSIS — D500 Iron deficiency anemia secondary to blood loss (chronic): Secondary | ICD-10-CM

## 2021-09-18 MED ORDER — SODIUM CHLORIDE 0.9 % IV SOLN
Freq: Once | INTRAVENOUS | Status: AC
  Filled 2021-09-18: qty 250

## 2021-09-18 MED ORDER — IRON SUCROSE 20 MG/ML IV SOLN
200.0000 mg | Freq: Once | INTRAVENOUS | Status: AC
  Administered 2021-09-18: 14:00:00 200 mg via INTRAVENOUS
  Filled 2021-09-18: qty 10

## 2021-09-18 MED ORDER — SODIUM CHLORIDE 0.9 % IV SOLN: 200.0000 mg | Freq: Once | INTRAVENOUS | Status: DC

## 2021-09-24 ENCOUNTER — Other Ambulatory Visit: Payer: Self-pay

## 2021-09-24 ENCOUNTER — Inpatient Hospital Stay: Payer: Self-pay

## 2021-09-24 VITALS — BP 131/72 | HR 57 | Temp 97.5°F | Resp 16

## 2021-09-24 DIAGNOSIS — D500 Iron deficiency anemia secondary to blood loss (chronic): Secondary | ICD-10-CM

## 2021-09-24 MED ORDER — SODIUM CHLORIDE 0.9 % IV SOLN
Freq: Once | INTRAVENOUS | Status: AC
  Filled 2021-09-24: qty 250

## 2021-09-24 MED ORDER — SODIUM CHLORIDE 0.9 % IV SOLN: 200.0000 mg | Freq: Once | INTRAVENOUS | Status: DC

## 2021-09-24 MED ORDER — IRON SUCROSE 20 MG/ML IV SOLN
200.0000 mg | Freq: Once | INTRAVENOUS | Status: AC
  Administered 2021-09-24: 15:00:00 200 mg via INTRAVENOUS
  Filled 2021-09-24: qty 10

## 2021-09-24 NOTE — Patient Instructions (Signed)

## 2021-09-26 ENCOUNTER — Other Ambulatory Visit: Payer: Self-pay

## 2021-09-26 ENCOUNTER — Ambulatory Visit (INDEPENDENT_AMBULATORY_CARE_PROVIDER_SITE_OTHER): Payer: Self-pay | Admitting: Obstetrics and Gynecology

## 2021-09-26 ENCOUNTER — Encounter: Payer: Self-pay | Admitting: Obstetrics and Gynecology

## 2021-09-26 VITALS — BP 140/82 | Ht 69.0 in | Wt 323.4 lb

## 2021-09-26 DIAGNOSIS — D259 Leiomyoma of uterus, unspecified: Secondary | ICD-10-CM

## 2021-09-26 DIAGNOSIS — N938 Other specified abnormal uterine and vaginal bleeding: Secondary | ICD-10-CM

## 2021-09-26 NOTE — Progress Notes (Signed)
Patient ID: Lauren Hensley, female   DOB: 1966/06/03, 55 y.o.   MRN: [id number]  Reason for Consult: Gynecologic Exam   Referred by Adrian Prows R, *  Subjective:     HPI:  Lauren Hensley is a 55 y.o. female she is following up after uterine artery embolization for enlarged fibroid uterus.  She reports that her bleeding has significantly improved.  She has discontinued her progesterone.  She reports that she is generally having spotting at the moment.  She will go 3 to 4 hours without needing to change a menstrual pad.  She is very happy with the decrease in her vaginal bleeding.  She is feeling much better.  Gynecological History  No LMP recorded.  Past Medical History:  Diagnosis Date   Anemia    Hypertension    Family History  Problem Relation Age of Onset   Diabetes Mother    COPD Mother    Heart Problems Mother    Emphysema Father    Hypercholesterolemia Brother    Diabetes Maternal Aunt    Obesity Maternal Aunt    Arthritis Maternal Aunt    Past Surgical History:  Procedure Laterality Date   COLONOSCOPY WITH PROPOFOL N/A 02/08/2021   Procedure: COLONOSCOPY WITH PROPOFOL;  Surgeon: Lin Landsman, MD;  Location: [facility] ENDOSCOPY;  Service: Gastroenterology;  Laterality: N/A;   EMBOLIZATION N/A 07/29/2021   Procedure: EMBOLIZATION;  Surgeon: Katha Cabal, MD;  Location: [place] CV LAB;  Service: Cardiovascular;  Laterality: N/A;   EMBOLIZATION N/A 09/02/2021   Procedure: EMBOLIZATION;  Surgeon: Katha Cabal, MD;  Location: [facility] CV LAB;  Service: Cardiovascular;  Laterality: N/A;   Emobolization  09/02/2021   ESOPHAGOGASTRODUODENOSCOPY N/A 02/08/2021   Procedure: ESOPHAGOGASTRODUODENOSCOPY (EGD);  Surgeon: Lin Landsman, MD;  Location: [facility] ENDOSCOPY;  Service: Gastroenterology;  Laterality: N/A;   GIVENS CAPSULE STUDY N/A 02/09/2021   Procedure: GIVENS CAPSULE STUDY;  Surgeon: Lesly Rubenstein, MD;  Location: [facility] ENDOSCOPY;   Service: Endoscopy;  Laterality: N/A;    Short Social History:  Social History   Tobacco Use   Smoking status: Never   Smokeless tobacco: Never  Substance Use Topics   Alcohol use: Not Currently    Comment: 6-7 months ago    No Known Allergies  Current Outpatient Medications  Medication Sig Dispense Refill   medroxyPROGESTERone (PROVERA) 10 MG tablet Take 2 tablets (20 mg total) by mouth 3 (three) times daily. (Patient not taking: Reported on 09/08/2021) 30 tablet 3   No current facility-administered medications for this visit.    Review of Systems  Constitutional: Negative for chills, fatigue, fever and unexpected weight change.  HENT: Negative for trouble swallowing.  Eyes: Negative for loss of vision.  Respiratory: Negative for cough, shortness of breath and wheezing.  Cardiovascular: Negative for chest pain, leg swelling, palpitations and syncope.  GI: Negative for abdominal pain, blood in stool, diarrhea, nausea and vomiting.  GU: Negative for difficulty urinating, dysuria, frequency and hematuria.  Musculoskeletal: Negative for back pain, leg pain and joint pain.  Skin: Negative for rash.  Neurological: Negative for dizziness, headaches, light-headedness, numbness and seizures.  Psychiatric: Negative for behavioral problem, confusion, depressed mood and sleep disturbance.       Objective:  Objective   Vitals:   09/26/21 1351  BP: 140/82  Weight: (!) 323 lb 6.4 oz (146.7 kg)  Height: 5\' 9"  (1.753 m)   Body mass index is 47.76 kg/m.  Physical Exam Vitals and nursing note reviewed.  Exam conducted with a chaperone present.  Constitutional:      Appearance: Normal appearance.  HENT:     Head: Normocephalic and atraumatic.  Eyes:     Extraocular Movements: Extraocular movements intact.     Pupils: Pupils are equal, round, and reactive to light.  Cardiovascular:     Rate and Rhythm: Normal rate and regular rhythm.  Pulmonary:     Effort: Pulmonary effort  is normal.     Breath sounds: Normal breath sounds.  Abdominal:     General: Abdomen is flat.     Palpations: Abdomen is soft.  Musculoskeletal:     Cervical back: Normal range of motion.  Skin:    General: Skin is warm and dry.  Neurological:     General: No focal deficit present.     Mental Status: She is alert and oriented to person, place, and time.  Psychiatric:        Behavior: Behavior normal.        Thought Content: Thought content normal.        Judgment: Judgment normal.    Assessment/Plan:     55 year old following up regarding menorrhagia secondary to enlarged uterine fibroids.  Currently managed with uterine artery embolization.  Patient has had a significant reduction in bleeding.  We will plan to follow-up with the patient in 2 months to continue assess bleeding and consider other interventions if needed.   More than 10 minutes were spent face to face with the patient in the room, reviewing the medical record, labs and images, and coordinating care for the patient. The plan of management was discussed in detail and counseling was provided.     Adrian Prows MD [facility], [facility] 09/26/2021 2:06 PM

## 2021-10-02 ENCOUNTER — Inpatient Hospital Stay: Payer: Medicaid Other

## 2021-10-02 ENCOUNTER — Other Ambulatory Visit: Payer: Self-pay

## 2021-10-02 ENCOUNTER — Inpatient Hospital Stay: Payer: Medicaid Other | Attending: Nurse Practitioner

## 2021-10-02 VITALS — BP 145/82 | HR 72 | Temp 97.7°F | Resp 20

## 2021-10-02 DIAGNOSIS — D500 Iron deficiency anemia secondary to blood loss (chronic): Secondary | ICD-10-CM

## 2021-10-02 DIAGNOSIS — N920 Excessive and frequent menstruation with regular cycle: Secondary | ICD-10-CM | POA: Insufficient documentation

## 2021-10-02 DIAGNOSIS — Z8349 Family history of other endocrine, nutritional and metabolic diseases: Secondary | ICD-10-CM | POA: Insufficient documentation

## 2021-10-02 DIAGNOSIS — M2550 Pain in unspecified joint: Secondary | ICD-10-CM | POA: Insufficient documentation

## 2021-10-02 DIAGNOSIS — Z79899 Other long term (current) drug therapy: Secondary | ICD-10-CM | POA: Insufficient documentation

## 2021-10-02 DIAGNOSIS — R42 Dizziness and giddiness: Secondary | ICD-10-CM | POA: Insufficient documentation

## 2021-10-02 DIAGNOSIS — D563 Thalassemia minor: Secondary | ICD-10-CM | POA: Insufficient documentation

## 2021-10-02 DIAGNOSIS — Z8261 Family history of arthritis: Secondary | ICD-10-CM | POA: Insufficient documentation

## 2021-10-02 DIAGNOSIS — R5383 Other fatigue: Secondary | ICD-10-CM | POA: Insufficient documentation

## 2021-10-02 DIAGNOSIS — Z833 Family history of diabetes mellitus: Secondary | ICD-10-CM | POA: Insufficient documentation

## 2021-10-02 MED ORDER — SODIUM CHLORIDE 0.9 % IV SOLN
Freq: Once | INTRAVENOUS | Status: AC
  Filled 2021-10-02: qty 250

## 2021-10-02 MED ORDER — IRON SUCROSE 20 MG/ML IV SOLN
200.0000 mg | Freq: Once | INTRAVENOUS | Status: AC
  Administered 2021-10-02: 200 mg via INTRAVENOUS
  Filled 2021-10-02: qty 10

## 2021-10-02 MED ORDER — SODIUM CHLORIDE 0.9 % IV SOLN: 200.0000 mg | Freq: Once | INTRAVENOUS | Status: DC

## 2021-10-02 NOTE — Patient Instructions (Signed)

## 2021-10-23 ENCOUNTER — Encounter: Payer: Self-pay | Admitting: Internal Medicine

## 2021-11-28 ENCOUNTER — Other Ambulatory Visit: Payer: Self-pay

## 2021-11-28 ENCOUNTER — Ambulatory Visit (INDEPENDENT_AMBULATORY_CARE_PROVIDER_SITE_OTHER): Payer: Self-pay | Admitting: Obstetrics and Gynecology

## 2021-11-28 ENCOUNTER — Encounter: Payer: Self-pay | Admitting: Obstetrics and Gynecology

## 2021-11-28 VITALS — BP 130/70 | Ht 68.0 in | Wt 340.0 lb

## 2021-11-28 DIAGNOSIS — D250 Submucous leiomyoma of uterus: Secondary | ICD-10-CM

## 2021-11-28 DIAGNOSIS — N938 Other specified abnormal uterine and vaginal bleeding: Secondary | ICD-10-CM

## 2021-11-28 NOTE — Progress Notes (Signed)
? ?Patient ID: Lauren Hensley, female   DOB: [birth year], 56 y.o.   MRN: [id number] ? ?Reason for Consult: Follow-up ?  ?Referred by Adrian Prows R, * ? ?Subjective:  ?   ?HPI: ? ?Lauren Hensley is a 56 y.o. female.  She reports that she has had a significant improvement in her menstrual pattern.  She reports that she no longer has spotting or bleeding throughout the month.  She will have a normal menstrual cycle each month with 3 to 4 days of bleeding.  The bleeding is moderate to light.  She is very happy with this improvement in her menstrual cycle pattern.  He has follow-up with hematology planned for next Monday. ? ?Gynecological History ? ?Patient's last menstrual period was 11/25/2021. ? ?Past Medical History:  ?Diagnosis Date  ? Anemia   ? Hypertension   ? ?Family History  ?Problem Relation Age of Onset  ? Diabetes Mother   ? COPD Mother   ? Heart Problems Mother   ? Emphysema Father   ? Hypercholesterolemia Brother   ? Diabetes Maternal Aunt   ? Obesity Maternal Aunt   ? Arthritis Maternal Aunt   ? ?Past Surgical History:  ?Procedure Laterality Date  ? COLONOSCOPY WITH PROPOFOL N/A 02/08/2021  ? Procedure: COLONOSCOPY WITH PROPOFOL;  Surgeon: Lin Landsman, MD;  Location: [facility] ENDOSCOPY;  Service: Gastroenterology;  Laterality: N/A;  ? EMBOLIZATION N/A 07/29/2021  ? Procedure: EMBOLIZATION;  Surgeon: Katha Cabal, MD;  Location: [place] CV LAB;  Service: Cardiovascular;  Laterality: N/A;  ? EMBOLIZATION N/A 09/02/2021  ? Procedure: EMBOLIZATION;  Surgeon: Katha Cabal, MD;  Location: [place] [facility];  Service: Cardiovascular;  Laterality: N/A;  ? Emobolization  09/02/2021  ? ESOPHAGOGASTRODUODENOSCOPY N/A 02/08/2021  ? Procedure: ESOPHAGOGASTRODUODENOSCOPY (EGD);  Surgeon: Lin Landsman, MD;  Location: [facility] ENDOSCOPY;  Service: Gastroenterology;  Laterality: N/A;  ? GIVENS CAPSULE STUDY N/A 02/09/2021  ? Procedure: GIVENS CAPSULE STUDY;  Surgeon: Lesly Rubenstein, MD;   Location: [facility] ENDOSCOPY;  Service: Endoscopy;  Laterality: N/A;  ? ? ?Short Social History:  ?Social History  ? ?Tobacco Use  ? Smoking status: Never  ? Smokeless tobacco: Never  ?Substance Use Topics  ? Alcohol use: Not Currently  ?  Comment: 6-7 months ago  ? ? ?No Known Allergies ? ?Current Outpatient Medications  ?Medication Sig Dispense Refill  ? medroxyPROGESTERone (PROVERA) 10 MG tablet Take 2 tablets (20 mg total) by mouth 3 (three) times daily. (Patient not taking: Reported on 09/08/2021) 30 tablet 3  ? ?No current facility-administered medications for this visit.  ? ? ?Review of Systems  ?Constitutional: Negative for chills, fatigue, fever and unexpected weight change.  ?HENT: Negative for trouble swallowing.  ?Eyes: Negative for loss of vision.  ?Respiratory: Negative for cough, shortness of breath and wheezing.  ?Cardiovascular: Negative for chest pain, leg swelling, palpitations and syncope.  ?GI: Negative for abdominal pain, blood in stool, diarrhea, nausea and vomiting.  ?GU: Negative for difficulty urinating, dysuria, frequency and hematuria.  ?Musculoskeletal: Negative for back pain, leg pain and joint pain.  ?Skin: Negative for rash.  ?Neurological: Negative for dizziness, headaches, light-headedness, numbness and seizures.  ?Psychiatric: Negative for behavioral problem, confusion, depressed mood and sleep disturbance.   ? ?   ?Objective:  ?Objective  ? ?Vitals:  ? 11/28/21 1100  ?BP: 130/70  ?Weight: (!) 340 lb (154.2 kg)  ?Height: 5\' 8"  (1.727 m)  ? ?Body mass index is 51.7 kg/m?. ? ?Physical Exam ?Vitals and  nursing note reviewed. Exam conducted with a chaperone present.  ?Constitutional:   ?   Appearance: Normal appearance.  ?HENT:  ?   Head: Normocephalic and atraumatic.  ?Eyes:  ?   Extraocular Movements: Extraocular movements intact.  ?   Pupils: Pupils are equal, round, and reactive to light.  ?Cardiovascular:  ?   Rate and Rhythm: Normal rate and regular rhythm.  ?Pulmonary:  ?   Effort:  Pulmonary effort is normal.  ?   Breath sounds: Normal breath sounds.  ?Abdominal:  ?   General: Abdomen is flat.  ?   Palpations: Abdomen is soft.  ?Musculoskeletal:  ?   Cervical back: Normal range of motion.  ?Skin: ?   General: Skin is warm and dry.  ?Neurological:  ?   General: No focal deficit present.  ?   Mental Status: She is alert and oriented to person, place, and time.  ?Psychiatric:     ?   Behavior: Behavior normal.     ?   Thought Content: Thought content normal.     ?   Judgment: Judgment normal.  ? ? ?Assessment/Plan:  ?  ? ?56 year old status post fibroid embolization for menorrhagia. ?Has seen a significant improvement in her menstrual cycle pattern. ?We will plan to follow-up for gynecological care as needed or in 1 year. ? ?More than 5 minutes were spent face to face with the patient in the room, reviewing the medical record, labs and images, and coordinating care for the patient. The plan of management was discussed in detail and counseling was provided.  ? ? ?  ? ?Adrian Prows MD ?[facility], [facility] ?11/28/2021 ?11:12 AM ? ? ?

## 2021-12-02 ENCOUNTER — Other Ambulatory Visit: Payer: Self-pay | Admitting: *Deleted

## 2021-12-02 DIAGNOSIS — D649 Anemia, unspecified: Secondary | ICD-10-CM

## 2021-12-02 DIAGNOSIS — D500 Iron deficiency anemia secondary to blood loss (chronic): Secondary | ICD-10-CM

## 2021-12-09 ENCOUNTER — Inpatient Hospital Stay: Payer: Medicaid Other | Attending: Nurse Practitioner

## 2021-12-09 ENCOUNTER — Inpatient Hospital Stay (HOSPITAL_BASED_OUTPATIENT_CLINIC_OR_DEPARTMENT_OTHER): Payer: Self-pay | Admitting: Internal Medicine

## 2021-12-09 ENCOUNTER — Encounter: Payer: Self-pay | Admitting: Internal Medicine

## 2021-12-09 ENCOUNTER — Other Ambulatory Visit: Payer: Self-pay

## 2021-12-09 ENCOUNTER — Inpatient Hospital Stay: Payer: Medicaid Other

## 2021-12-09 DIAGNOSIS — D500 Iron deficiency anemia secondary to blood loss (chronic): Secondary | ICD-10-CM

## 2021-12-09 DIAGNOSIS — D649 Anemia, unspecified: Secondary | ICD-10-CM

## 2021-12-09 DIAGNOSIS — N920 Excessive and frequent menstruation with regular cycle: Secondary | ICD-10-CM | POA: Insufficient documentation

## 2021-12-09 LAB — CBC WITH DIFFERENTIAL/PLATELET
Abs Immature Granulocytes: 0 10*3/uL (ref 0.00–0.07)
Basophils Absolute: 0.1 10*3/uL (ref 0.0–0.1)
Basophils Relative: 1 %
Eosinophils Absolute: 0.1 10*3/uL (ref 0.0–0.5)
Eosinophils Relative: 2 %
HCT: 38.5 % (ref 36.0–46.0)
Hemoglobin: 11.7 g/dL — ABNORMAL LOW (ref 12.0–15.0)
Immature Granulocytes: 0 %
Lymphocytes Relative: 38 %
Lymphs Abs: 1.6 10*3/uL (ref 0.7–4.0)
MCH: 22.5 pg — ABNORMAL LOW (ref 26.0–34.0)
MCHC: 30.4 g/dL (ref 30.0–36.0)
MCV: 74.2 fL — ABNORMAL LOW (ref 80.0–100.0)
Monocytes Absolute: 0.4 10*3/uL (ref 0.1–1.0)
Monocytes Relative: 10 %
Neutro Abs: 2.1 10*3/uL (ref 1.7–7.7)
Neutrophils Relative %: 49 %
Platelets: 331 10*3/uL (ref 150–400)
RBC: 5.19 MIL/uL — ABNORMAL HIGH (ref 3.87–5.11)
RDW: 15.6 % — ABNORMAL HIGH (ref 11.5–15.5)
WBC: 4.2 10*3/uL (ref 4.0–10.5)
nRBC: 0 % (ref 0.0–0.2)

## 2021-12-09 LAB — FERRITIN: Ferritin: 10 ng/mL — ABNORMAL LOW (ref 11–307)

## 2021-12-09 LAB — IRON AND TIBC
Iron: 106 ug/dL (ref 28–170)
Saturation Ratios: 25 % (ref 10.4–31.8)
TIBC: 420 ug/dL (ref 250–450)
UIBC: 314 ug/dL

## 2021-12-09 LAB — SAMPLE TO BLOOD BANK

## 2021-12-09 MED ORDER — IRON SUCROSE 20 MG/ML IV SOLN: 200.0000 mg | Freq: Once | INTRAVENOUS | Status: DC

## 2021-12-09 MED ORDER — SODIUM CHLORIDE 0.9 % IV SOLN: 200.0000 mg | Freq: Once | INTRAVENOUS | Status: DC

## 2021-12-09 MED ORDER — SODIUM CHLORIDE 0.9 % IV SOLN
Freq: Once | INTRAVENOUS | Status: DC
  Filled 2021-12-09: qty 250

## 2021-12-09 NOTE — Patient Instructions (Signed)
#  Recommend gentle iron 1 pill a day; should not upset your stomach or cause constipation.  Talk to the pharmacist if you can find it/it is over-the-counter.  

## 2021-12-09 NOTE — Progress Notes (Signed)
Unable to obtain IV access today despite attempts by 3 nurses. Patient will reschedule. Dr. Rogue Bussing aware. ?

## 2021-12-09 NOTE — Progress Notes (Signed)
?[facility] ?OFFICE PROGRESS NOTE ? ?Patient Care Team: ?Lauren Fellers, MD as PCP - General (Obstetrics and Gynecology) ?Lauren Junker, RN as Registered Nurse ?Lauren Demark, RN as Registered Nurse ? ? Cancer Staging  ?No matching staging information was found for the patient. ? ? ?Oncology History  ? No history exists.  ? 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  ? ?#Heavy menstrual cycles-fibroids [Dr Shuman]  ? ?INTERVAL HISTORY: Ambulating independently. ? ?Lauren Hensley 56 y.o.  female pleasant patient history of iron deficient anemia/beta thalassemia-other history of fibroids/heavy menstrual cycle is here for follow-up. ? ?Patient interim has been evaluated by vascular, s/p repeat embolization.  Patient's menstrual cycles are significantly improved.  She continues to have mild menstrual bleeding. ? ? ?Review of Systems  ?Constitutional:  Positive for malaise/fatigue. Negative for chills, diaphoresis, fever and weight loss.  ?HENT:  Negative for nosebleeds and sore throat.   ?Eyes:  Negative for double vision.  ?Respiratory:  Negative for cough, hemoptysis, sputum production, shortness of breath and wheezing.   ?Cardiovascular:  Negative for chest pain, palpitations, orthopnea and leg swelling.  ?Gastrointestinal:  Negative for abdominal pain, blood in stool, constipation, diarrhea, heartburn, melena, nausea and vomiting.  ?Genitourinary:  Negative for dysuria, frequency and urgency.  ?Musculoskeletal:  Positive for joint pain. Negative for back pain.  ?Skin: Negative.  Negative for itching and rash.  ?Neurological:  Positive for dizziness. Negative for tingling, focal weakness, weakness and headaches.  ?Endo/Heme/Allergies:  Does not bruise/bleed easily.  ?Psychiatric/Behavioral:  Negative for depression. The patient is not nervous/anxious and does not have insomnia.   ?  ? ?PAST MEDICAL HISTORY :  ?Past  Medical History:  ?Diagnosis Date  ? Anemia   ? Hypertension   ? ? ?PAST SURGICAL HISTORY :   ?Past Surgical History:  ?Procedure Laterality Date  ? COLONOSCOPY WITH PROPOFOL N/A 02/08/2021  ? Procedure: COLONOSCOPY WITH PROPOFOL;  Surgeon: Lin Landsman, MD;  Location: [facility] ENDOSCOPY;  Service: Gastroenterology;  Laterality: N/A;  ? EMBOLIZATION N/A 07/29/2021  ? Procedure: EMBOLIZATION;  Surgeon: Katha Cabal, MD;  Location: [place] CV LAB;  Service: Cardiovascular;  Laterality: N/A;  ? EMBOLIZATION N/A 09/02/2021  ? Procedure: EMBOLIZATION;  Surgeon: Katha Cabal, MD;  Location: [place] [facility] LAB;  Service: Cardiovascular;  Laterality: N/A;  ? Emobolization  09/02/2021  ? ESOPHAGOGASTRODUODENOSCOPY N/A 02/08/2021  ? Procedure: ESOPHAGOGASTRODUODENOSCOPY (EGD);  Surgeon: Lin Landsman, MD;  Location: [facility] ENDOSCOPY;  Service: Gastroenterology;  Laterality: N/A;  ? GIVENS CAPSULE STUDY N/A 02/09/2021  ? Procedure: GIVENS CAPSULE STUDY;  Surgeon: Lesly Rubenstein, MD;  Location: [facility] ENDOSCOPY;  Service: Endoscopy;  Laterality: N/A;  ? ? ?FAMILY HISTORY :   ?Family History  ?Problem Relation Age of Onset  ? Diabetes Mother   ? COPD Mother   ? Heart Problems Mother   ? Emphysema Father   ? Hypercholesterolemia Brother   ? Diabetes Maternal Aunt   ? Obesity Maternal Aunt   ? Arthritis Maternal Aunt   ? ? ?SOCIAL HISTORY:   ?Social History  ? ?Tobacco Use  ? Smoking status: Never  ? Smokeless tobacco: Never  ?Vaping Use  ? Vaping Use: Never used  ?Substance Use Topics  ? Alcohol use: Not Currently  ?  Comment: 6-7 months ago  ? Drug use: Not Currently  ?  Types: Marijuana, Cocaine  ? ? ?ALLERGIES:  has No Known Allergies. ? ?MEDICATIONS:  ?  No current outpatient medications on file.  ? ?No current facility-administered medications for this visit.  ? ? ?PHYSICAL EXAMINATION: ? ?BP (!) 141/81 (BP Location: Left Arm, Patient Position: Sitting, Cuff Size: Large)   Pulse 65   Temp 98 ?F  (36.7 ?C) (Tympanic)   Ht '5\' 8"'$  (1.727 m)   Wt (!) 342 lb 12.8 oz (155.5 kg)   LMP 11/25/2021   SpO2 100%   BMI 52.12 kg/m?  ? Lauren Hensley Weights  ? 12/09/21 1436  ?Weight: (!) 342 lb 12.8 oz (155.5 kg)  ? ? ? ?Physical Exam ?Vitals and nursing note reviewed.  ?HENT:  ?   Head: Normocephalic and atraumatic.  ?   Mouth/Throat:  ?   Pharynx: Oropharynx is clear.  ?Eyes:  ?   Extraocular Movements: Extraocular movements intact.  ?   Pupils: Pupils are equal, round, and reactive to light.  ?Cardiovascular:  ?   Rate and Rhythm: Normal rate and regular rhythm.  ?Pulmonary:  ?   Comments: Decreased breath sounds bilaterally.  ?Abdominal:  ?   Palpations: Abdomen is soft.  ?Musculoskeletal:     ?   General: Normal range of motion.  ?   Cervical back: Normal range of motion.  ?Skin: ?   General: Skin is warm.  ?Neurological:  ?   General: No focal deficit present.  ?   Mental Status: She is alert and oriented to person, place, and time.  ?Psychiatric:     ?   Behavior: Behavior normal.     ?   Judgment: Judgment normal.  ? ? ? ? ? ?LABORATORY DATA:  ?I have reviewed the data as listed ?   ?Component Value Date/Time  ? NA 134 (L) 09/04/2021 0354  ? K 4.0 09/04/2021 0354  ? CL 108 09/04/2021 0354  ? CO2 22 09/04/2021 0354  ? GLUCOSE 95 09/04/2021 0354  ? BUN 19 09/04/2021 0354  ? CREATININE 1.01 (H) 09/04/2021 0354  ? CALCIUM 8.2 (L) 09/04/2021 0354  ? PROT 7.3 03/13/2021 1519  ? ALBUMIN 3.9 03/13/2021 1519  ? AST 15 03/13/2021 1519  ? ALT 11 03/13/2021 1519  ? ALKPHOS 33 (L) 03/13/2021 1519  ? BILITOT 0.9 03/13/2021 1519  ? GFRNONAA >60 09/04/2021 0354  ? ? ?No results found for: SPEP, UPEP ? ?Lab Results  ?Component Value Date  ? WBC 4.2 12/09/2021  ? NEUTROABS 2.1 12/09/2021  ? HGB 11.7 (L) 12/09/2021  ? HCT 38.5 12/09/2021  ? MCV 74.2 (L) 12/09/2021  ? PLT 331 12/09/2021  ? ? ?  Chemistry   ?   ?Component Value Date/Time  ? NA 134 (L) 09/04/2021 0354  ? K 4.0 09/04/2021 0354  ? CL 108 09/04/2021 0354  ? CO2 22 09/04/2021  0354  ? BUN 19 09/04/2021 0354  ? CREATININE 1.01 (H) 09/04/2021 0354  ?    ?Component Value Date/Time  ? CALCIUM 8.2 (L) 09/04/2021 0354  ? ALKPHOS 33 (L) 03/13/2021 1519  ? AST 15 03/13/2021 1519  ? ALT 11 03/13/2021 1519  ? BILITOT 0.9 03/13/2021 1519  ?  ? ? ? ?RADIOGRAPHIC STUDIES: ?I have personally reviewed the radiological images as listed and agreed with the findings in the report. ?No results found.  ? 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 S/p IV Venofer weekly x4.   ? ?# Today- Hb 11.2; proceed with  IV Venofer x1 today. ?  ?#Menorrhagia-fibroids s/p gynecology evaluation. S/p partial embolization left & right uterine artery  [Dr.Dew]-DC 2022]- improved.  ? ?needs transportation ?# DISPOSITION ?# venofer today: ?# follow up in 3 months- MD; labs- cbc;bmp; possible- venofer- Dr.B ? ? ?Orders Placed This Encounter  ?Procedures  ? Basic metabolic panel  ?  Standing Status:   Future  ?  Standing Expiration Date:   12/10/2022  ? CBC with Differential/Platelet  ?  Standing Status:   Future  ?  Standing Expiration Date:   12/10/2022  ? ?All questions were answered. The patient knows to call the clinic with any problems, questions or concerns.  ? ?  ? Cammie Sickle, MD ?12/09/2021 2:59 PM ? ? ?

## 2021-12-09 NOTE — Assessment & Plan Note (Addendum)
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 S/p IV Venofer weekly x4.   ? ?# Today- Hb 11.2; proceed with IV Venofer x1 today.  Recommend gentle iron.  ?? ?#Menorrhagia-fibroids s/p gynecology evaluation. S/p partial embolization left & right uterine artery  [Dr.Dew]-DC 2022]- improved.  ? ?needs transportation ?# DISPOSITION ?# venofer today: ?# follow up in 3 months- MD; labs- cbc;bmp; possible- venofer- Dr.B ? ?

## 2021-12-09 NOTE — Progress Notes (Signed)
Pt states she has had b/l leg pain mostly qhs x2 weeks. ? ? ?

## 2021-12-11 ENCOUNTER — Inpatient Hospital Stay: Payer: Medicaid Other

## 2021-12-11 ENCOUNTER — Other Ambulatory Visit: Payer: Self-pay

## 2021-12-11 VITALS — BP 135/79 | HR 65 | Temp 97.8°F

## 2021-12-11 MED ORDER — IRON SUCROSE 20 MG/ML IV SOLN
200.0000 mg | Freq: Once | INTRAVENOUS | Status: AC
  Administered 2021-12-11: 200 mg via INTRAVENOUS
  Filled 2021-12-11: qty 10

## 2021-12-11 MED ORDER — SODIUM CHLORIDE 0.9 % IV SOLN: 200.0000 mg | Freq: Once | INTRAVENOUS | Status: DC

## 2021-12-11 NOTE — Patient Instructions (Signed)
[facility]  Discharge Instructions: ?Thank you for choosing [facility] to provide your oncology and hematology care.  ?If you have a lab appointment with the Cancer Center, please go directly to the Cancer Center and check in at the registration area. ? ?Wear comfortable clothing and clothing appropriate for easy access to any Portacath or PICC line.  ? ?We strive to give you quality time with your provider. You may need to reschedule your appointment if you arrive late (15 or more minutes).  Arriving late affects you and other patients whose appointments are after yours.  Also, if you miss three or more appointments without notifying the office, you may be dismissed from the clinic at the provider?s discretion.    ?  ?For prescription refill requests, have your pharmacy contact our office and allow 72 hours for refills to be completed.   ? ?Today you received the following chemotherapy and/or immunotherapy agents VENOFER    ?  ?To help prevent nausea and vomiting after your treatment, we encourage you to take your nausea medication as directed. ? ?BELOW ARE SYMPTOMS THAT SHOULD BE REPORTED IMMEDIATELY: ?*FEVER GREATER THAN 100.4 F (38 ?C) OR HIGHER ?*CHILLS OR SWEATING ?*NAUSEA AND VOMITING THAT IS NOT CONTROLLED WITH YOUR NAUSEA MEDICATION ?*UNUSUAL SHORTNESS OF BREATH ?*UNUSUAL BRUISING OR BLEEDING ?*URINARY PROBLEMS (pain or burning when urinating, or frequent urination) ?*BOWEL PROBLEMS (unusual diarrhea, constipation, pain near the anus) ?TENDERNESS IN MOUTH AND THROAT WITH OR WITHOUT PRESENCE OF ULCERS (sore throat, sores in mouth, or a toothache) ?UNUSUAL RASH, SWELLING OR PAIN  ?UNUSUAL VAGINAL DISCHARGE OR ITCHING  ? ?Items with * indicate a potential emergency and should be followed up as soon as possible or go to the Emergency Department if any problems should occur. ? ?Please show the CHEMOTHERAPY ALERT CARD or IMMUNOTHERAPY ALERT CARD at check-in to the  Emergency Department and triage nurse. ? ?Should you have questions after your visit or need to cancel or reschedule your appointment, please contact [facility]  [phone] and follow the prompts.  Office hours are 8:00 a.m. to 4:30 p.m. Monday - Friday. Please note that voicemails left after 4:00 p.m. may not be returned until the following business day.  We are closed weekends and major holidays. You have access to a nurse at all times for urgent questions. Please call the main number to the clinic [phone] and follow the prompts. ? ?For any non-urgent questions, you may also contact your provider using MyChart. We now offer e-Visits for anyone 18 and older to request care online for non-urgent symptoms. For details visit [email or web address]. ?  ?Also download the MyChart app! Go to the app store, search "MyChart", open the app, select [facility], and log in with your MyChart username and password. ? ?Due to Covid, a mask is required upon entering the hospital/clinic. If you do not have a mask, one will be given to you upon arrival. For doctor visits, patients may have 1 support person aged 18 or older with them. For treatment visits, patients cannot have anyone with them due to current Covid guidelines and our immunocompromised population.  ? ?Iron Sucrose Injection ?What is this medication? ?IRON SUCROSE (EYE ern SOO krose) treats low levels of iron (iron deficiency anemia) in people with kidney disease. Iron is a mineral that plays an important role in making red blood cells, which carry oxygen from your lungs to the rest of your body. ?This medicine may   be used for other purposes; ask your health care provider or pharmacist if you have questions. ?COMMON BRAND NAME(S): Venofer ?What should I tell my care team before I take this medication? ?They need to know if you have any of these conditions: ?Anemia not caused by low iron levels ?Heart disease ?High levels of  iron in the blood ?Kidney disease ?Liver disease ?An unusual or allergic reaction to iron, other medications, foods, dyes, or preservatives ?Pregnant or trying to get pregnant ?Breast-feeding ?How should I use this medication? ?This medication is for infusion into a vein. It is given in a hospital or clinic setting. ?Talk to your care team about the use of this medication in children. While this medication may be prescribed for children as young as 2 years for selected conditions, precautions do apply. ?Overdosage: If you think you have taken too much of this medicine contact a poison control center or emergency room at once. ?NOTE: This medicine is only for you. Do not share this medicine with others. ?What if I miss a dose? ?It is important not to miss your dose. Call your care team if you are unable to keep an appointment. ?What may interact with this medication? ?Do not take this medication with any of the following: ?Deferoxamine ?Dimercaprol ?Other iron products ?This medication may also interact with the following: ?Chloramphenicol ?Deferasirox ?This list may not describe all possible interactions. Give your health care provider a list of all the medicines, herbs, non-prescription drugs, or dietary supplements you use. Also tell them if you smoke, drink alcohol, or use illegal drugs. Some items may interact with your medicine. ?What should I watch for while using this medication? ?Visit your care team regularly. Tell your care team if your symptoms do not start to get better or if they get worse. You may need blood work done while you are taking this medication. ?You may need to follow a special diet. Talk to your care team. Foods that contain iron include: whole grains/cereals, dried fruits, beans, or peas, leafy green vegetables, and organ meats (liver, kidney). ?What side effects may I notice from receiving this medication? ?Side effects that you should report to your care team as soon as  possible: ?Allergic reactions--skin rash, itching, hives, swelling of the face, lips, tongue, or throat ?Low blood pressure--dizziness, feeling faint or lightheaded, blurry vision ?Shortness of breath ?Side effects that usually do not require medical attention (report to your care team if they continue or are bothersome): ?Flushing ?Headache ?Joint pain ?Muscle pain ?Nausea ?Pain, redness, or irritation at injection site ?This list may not describe all possible side effects. Call your doctor for medical advice about side effects. You may report side effects to FDA at 1-800-FDA-1088. ?Where should I keep my medication? ?This medication is given in a hospital or clinic and will not be stored at home. ?NOTE: This sheet is a summary. It may not cover all possible information. If you have questions about this medicine, talk to your doctor, pharmacist, or health care provider. ?? 2022 Elsevier/Gold Standard (2021-02-07 00:00:00) ? ?

## 2022-01-29 ENCOUNTER — Other Ambulatory Visit: Payer: Self-pay

## 2022-03-10 ENCOUNTER — Inpatient Hospital Stay: Payer: Medicaid Other

## 2022-03-10 ENCOUNTER — Encounter: Payer: Self-pay | Admitting: Internal Medicine

## 2022-03-10 ENCOUNTER — Inpatient Hospital Stay: Payer: Medicaid Other | Attending: Nurse Practitioner | Admitting: Internal Medicine

## 2022-03-10 ENCOUNTER — Other Ambulatory Visit: Payer: Self-pay

## 2022-03-10 DIAGNOSIS — D500 Iron deficiency anemia secondary to blood loss (chronic): Secondary | ICD-10-CM | POA: Insufficient documentation

## 2022-03-10 DIAGNOSIS — N920 Excessive and frequent menstruation with regular cycle: Secondary | ICD-10-CM | POA: Insufficient documentation

## 2022-03-10 NOTE — Assessment & Plan Note (Signed)
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 S/p IV Venofer.    # MARCH 2023- Hb 11.2; s/p  IV Venofer in March 2023.  Today unable to draw labs because of poor IV access.  We will have patient come back after hydration next week for Venofer.  Recommend taking gentle iron once a day.   # Menorrhagia-fibroids s/p gynecology evaluation. S/p partial embolization left & right uterine artery  [Dr.Dew]-DC 2022]- STABLE.   # IV access: POOR discussed continue hydration.  We will have to consider either port placement if patient needs ongoing labs/infusions.  However suspect if menorrhagia improves she will not need any further IV iron infusions.  needs transportation # DISPOSITION # HOLD venofer today: # venofer x1 next week.  # follow up in 3 months- MD; labs- cbc;bmp; possible- venofer- Dr.B

## 2022-03-10 NOTE — Progress Notes (Unsigned)
[facility] OFFICE PROGRESS NOTE  Patient Care Team: Homero Fellers, MD (Inactive) as PCP - General (Obstetrics and Gynecology) Rico Junker, RN as Registered Nurse Theodore Demark, RN (Inactive) as Registered Nurse Cammie Sickle, MD as Consulting Physician (Oncology)   Cancer Staging  No matching staging information was found for the patient.   Oncology History   No history exists.   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   #Heavy menstrual cycles-fibroids [Dr Shuman]; DEC 2022- s/p uterine embolization.    INTERVAL HISTORY: Ambulating independently.  Alone.  Lauren Hensley 56 y.o.  female pleasant patient history of iron deficient anemia/beta thalassemia-other history of fibroids/heavy menstrual cycle is here for follow-up.  Patient continues to have mild intermittent menstrual cycles post embolization.  However not heavy.   She is not on oral iron.  Unfortunately stuck multiple times in the lab without any success.  Review of Systems  Constitutional:  Positive for malaise/fatigue. Negative for chills, diaphoresis, fever and weight loss.  HENT:  Negative for nosebleeds and sore throat.   Eyes:  Negative for double vision.  Respiratory:  Negative for cough, hemoptysis, sputum production, shortness of breath and wheezing.   Cardiovascular:  Negative for chest pain, palpitations, orthopnea and leg swelling.  Gastrointestinal:  Negative for abdominal pain, blood in stool, constipation, diarrhea, heartburn, melena, nausea and vomiting.  Genitourinary:  Negative for dysuria, frequency and urgency.  Musculoskeletal:  Positive for joint pain. Negative for back pain.  Skin: Negative.  Negative for itching and rash.  Neurological:  Positive for dizziness. Negative for tingling, focal weakness, weakness and headaches.  Endo/Heme/Allergies:  Does not bruise/bleed easily.   Psychiatric/Behavioral:  Negative for depression. The patient is not nervous/anxious and does not have insomnia.       PAST MEDICAL HISTORY :  Past Medical History:  Diagnosis Date   Anemia    Hypertension     PAST SURGICAL HISTORY :   Past Surgical History:  Procedure Laterality Date   COLONOSCOPY WITH PROPOFOL N/A 02/08/2021   Procedure: COLONOSCOPY WITH PROPOFOL;  Surgeon: Lin Landsman, MD;  Location: [facility] ENDOSCOPY;  Service: Gastroenterology;  Laterality: N/A;   EMBOLIZATION N/A 07/29/2021   Procedure: EMBOLIZATION;  Surgeon: Katha Cabal, MD;  Location: [place] CV LAB;  Service: Cardiovascular;  Laterality: N/A;   EMBOLIZATION N/A 09/02/2021   Procedure: EMBOLIZATION;  Surgeon: Katha Cabal, MD;  Location: [place] [facility] LAB;  Service: Cardiovascular;  Laterality: N/A;   Emobolization  09/02/2021   ESOPHAGOGASTRODUODENOSCOPY N/A 02/08/2021   Procedure: ESOPHAGOGASTRODUODENOSCOPY (EGD);  Surgeon: Lin Landsman, MD;  Location: [facility] ENDOSCOPY;  Service: Gastroenterology;  Laterality: N/A;   GIVENS CAPSULE STUDY N/A 02/09/2021   Procedure: GIVENS CAPSULE STUDY;  Surgeon: Lesly Rubenstein, MD;  Location: [facility] ENDOSCOPY;  Service: Endoscopy;  Laterality: N/A;    FAMILY HISTORY :   Family History  Problem Relation Age of Onset   Diabetes Mother    COPD Mother    Heart Problems Mother    Emphysema Father    Hypercholesterolemia Brother    Diabetes Maternal Aunt    Obesity Maternal Aunt    Arthritis Maternal Aunt     SOCIAL HISTORY:   Social History   Tobacco Use   Smoking status: Never   Smokeless tobacco: Never  Vaping Use   Vaping Use: Never used  Substance Use Topics   Alcohol use: Not Currently    Comment: 6-7  months ago   Drug use: Not Currently    Types: Marijuana, Cocaine    ALLERGIES:  has No Known Allergies.  MEDICATIONS:  No current outpatient medications on file.   No current facility-administered medications  for this visit.    PHYSICAL EXAMINATION:  BP 138/72   Pulse 82   Temp 98.7 F (37.1 C)   Resp 20   Ht '5\' 8"'$  (1.727 m)   Wt (!) 338 lb (153.3 kg)   SpO2 100%   BMI 51.39 kg/m   Filed Weights   03/10/22 1313  Weight: (!) 338 lb (153.3 kg)     Physical Exam Vitals and nursing note reviewed.  HENT:     Head: Normocephalic and atraumatic.     Mouth/Throat:     Pharynx: Oropharynx is clear.  Eyes:     Extraocular Movements: Extraocular movements intact.     Pupils: Pupils are equal, round, and reactive to light.  Cardiovascular:     Rate and Rhythm: Normal rate and regular rhythm.  Pulmonary:     Comments: Decreased breath sounds bilaterally.  Abdominal:     Palpations: Abdomen is soft.  Musculoskeletal:        General: Normal range of motion.     Cervical back: Normal range of motion.  Skin:    General: Skin is warm.  Neurological:     General: No focal deficit present.     Mental Status: She is alert and oriented to person, place, and time.  Psychiatric:        Behavior: Behavior normal.        Judgment: Judgment normal.        LABORATORY DATA:  I have reviewed the data as listed    Component Value Date/Time   NA 134 (L) 09/04/2021 0354   K 4.0 09/04/2021 0354   CL 108 09/04/2021 0354   CO2 22 09/04/2021 0354   GLUCOSE 95 09/04/2021 0354   BUN 19 09/04/2021 0354   CREATININE 1.01 (H) 09/04/2021 0354   CALCIUM 8.2 (L) 09/04/2021 0354   PROT 7.3 03/13/2021 1519   ALBUMIN 3.9 03/13/2021 1519   AST 15 03/13/2021 1519   ALT 11 03/13/2021 1519   ALKPHOS 33 (L) 03/13/2021 1519   BILITOT 0.9 03/13/2021 1519   GFRNONAA >60 09/04/2021 0354    No results found for: "SPEP", "UPEP"  Lab Results  Component Value Date   WBC 4.2 12/09/2021   NEUTROABS 2.1 12/09/2021   HGB 11.7 (L) 12/09/2021   HCT 38.5 12/09/2021   MCV 74.2 (L) 12/09/2021   PLT 331 12/09/2021      Chemistry      Component Value Date/Time   NA 134 (L) 09/04/2021 0354   K 4.0  09/04/2021 0354   CL 108 09/04/2021 0354   CO2 22 09/04/2021 0354   BUN 19 09/04/2021 0354   CREATININE 1.01 (H) 09/04/2021 0354      Component Value Date/Time   CALCIUM 8.2 (L) 09/04/2021 0354   ALKPHOS 33 (L) 03/13/2021 1519   AST 15 03/13/2021 1519   ALT 11 03/13/2021 1519   BILITOT 0.9 03/13/2021 1519       RADIOGRAPHIC STUDIES: I have personally reviewed the radiological images as listed and agreed with the findings in the report. No results found.   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 S/p IV Venofer.    #  MARCH 2023- Hb 11.2; s/p  IV Venofer in March 2023.  Today unable to draw labs because of poor IV access.  We will have patient come back after hydration next week for Venofer.  Recommend taking gentle iron once a day.    # Menorrhagia-fibroids s/p gynecology evaluation. S/p partial embolization left & right uterine artery  [Dr.Dew]-DC 2022]- STABLE.   # IV access: POOR discussed continue hydration.  We will have to consider either port placement if patient needs ongoing labs/infusions.  However suspect if menorrhagia improves she will not need any further IV iron infusions.  needs transportation # DISPOSITION # HOLD venofer today: # venofer x1 next week.  # follow up in 3 months- MD; labs- cbc;bmp; possible- venofer- Dr.B   Orders Placed This Encounter  Procedures   CBC with Differential    Standing Status:   Future    Standing Expiration Date:   3/84/5364   Basic metabolic panel    Standing Status:   Future    Standing Expiration Date:   03/10/2023   All questions were answered. The patient knows to call the clinic with any problems, questions or concerns.      Cammie Sickle, MD 03/11/2022 8:24 AM

## 2022-03-10 NOTE — Patient Instructions (Signed)
#  Recommend gentle iron 1 pill a day; should not upset your stomach or cause constipation.  Talk to the pharmacist if you can find it/it is over-the-counter.  

## 2022-03-10 NOTE — Progress Notes (Signed)
Patient states she can tell her Hbg is low because she is more short of breathe.

## 2022-03-11 ENCOUNTER — Encounter: Payer: Self-pay | Admitting: Internal Medicine

## 2022-03-16 ENCOUNTER — Inpatient Hospital Stay: Payer: Medicaid Other

## 2022-03-16 VITALS — BP 148/87 | HR 74 | Temp 96.9°F | Resp 18

## 2022-03-16 DIAGNOSIS — D500 Iron deficiency anemia secondary to blood loss (chronic): Secondary | ICD-10-CM

## 2022-03-16 MED ORDER — SODIUM CHLORIDE 0.9 % IV SOLN: 200.0000 mg | Freq: Once | INTRAVENOUS | Status: DC

## 2022-03-16 MED ORDER — IRON SUCROSE 20 MG/ML IV SOLN
200.0000 mg | Freq: Once | INTRAVENOUS | Status: AC
  Administered 2022-03-16: 200 mg via INTRAVENOUS
  Filled 2022-03-16: qty 10

## 2022-03-16 MED ORDER — SODIUM CHLORIDE 0.9 % IV SOLN
Freq: Once | INTRAVENOUS | Status: AC
  Filled 2022-03-16: qty 250

## 2022-03-16 NOTE — Patient Instructions (Signed)
[facility]  Discharge Instructions: Thank you for choosing [facility] to provide your oncology and hematology care.  If you have a lab appointment with the Cancer Center, please go directly to the Cancer Center and check in at the registration area.  Wear comfortable clothing and clothing appropriate for easy access to any Portacath or PICC line.   We strive to give you quality time with your provider. You may need to reschedule your appointment if you arrive late (15 or more minutes).  Arriving late affects you and other patients whose appointments are after yours.  Also, if you miss three or more appointments without notifying the office, you may be dismissed from the clinic at the provider's discretion.      For prescription refill requests, have your pharmacy contact our office and allow 72 hours for refills to be completed.    Today you received the following chemotherapy and/or immunotherapy agents VENOFER      To help prevent nausea and vomiting after your treatment, we encourage you to take your nausea medication as directed.  BELOW ARE SYMPTOMS THAT SHOULD BE REPORTED IMMEDIATELY: *FEVER GREATER THAN 100.4 F (38 C) OR HIGHER *CHILLS OR SWEATING *NAUSEA AND VOMITING THAT IS NOT CONTROLLED WITH YOUR NAUSEA MEDICATION *UNUSUAL SHORTNESS OF BREATH *UNUSUAL BRUISING OR BLEEDING *URINARY PROBLEMS (pain or burning when urinating, or frequent urination) *BOWEL PROBLEMS (unusual diarrhea, constipation, pain near the anus) TENDERNESS IN MOUTH AND THROAT WITH OR WITHOUT PRESENCE OF ULCERS (sore throat, sores in mouth, or a toothache) UNUSUAL RASH, SWELLING OR PAIN  UNUSUAL VAGINAL DISCHARGE OR ITCHING   Items with * indicate a potential emergency and should be followed up as soon as possible or go to the Emergency Department if any problems should occur.  Please show the CHEMOTHERAPY ALERT CARD or IMMUNOTHERAPY ALERT CARD at check-in to the  Emergency Department and triage nurse.  Should you have questions after your visit or need to cancel or reschedule your appointment, please contact [facility]  [phone] and follow the prompts.  Office hours are 8:00 a.m. to 4:30 p.m. Monday - Friday. Please note that voicemails left after 4:00 p.m. may not be returned until the following business day.  We are closed weekends and major holidays. You have access to a nurse at all times for urgent questions. Please call the main number to the clinic [phone] and follow the prompts.  For any non-urgent questions, you may also contact your provider using MyChart. We now offer e-Visits for anyone 18 and older to request care online for non-urgent symptoms. For details visit [email or web address].   Also download the MyChart app! Go to the app store, search "MyChart", open the app, select [facility], and log in with your MyChart username and password.  Masks are optional in the cancer centers. If you would like for your care team to wear a mask while they are taking care of you, please let them know. For doctor visits, patients may have with them one support person who is at least 54 years old. At this time, visitors are not allowed in the infusion area.   Iron Sucrose Injection What is this medication? IRON SUCROSE (EYE ern SOO krose) treats low levels of iron (iron deficiency anemia) in people with kidney disease. Iron is a mineral that plays an important role in making red blood cells, which carry oxygen from your lungs to the rest of your body. This medicine may   be used for other purposes; ask your health care provider or pharmacist if you have questions. COMMON BRAND NAME(S): Venofer What should I tell my care team before I take this medication? They need to know if you have any of these conditions: Anemia not caused by low iron levels Heart disease High levels of iron in the blood Kidney disease Liver  disease An unusual or allergic reaction to iron, other medications, foods, dyes, or preservatives Pregnant or trying to get pregnant Breast-feeding How should I use this medication? This medication is for infusion into a vein. It is given in a hospital or clinic setting. Talk to your care team about the use of this medication in children. While this medication may be prescribed for children as young as 2 years for selected conditions, precautions do apply. Overdosage: If you think you have taken too much of this medicine contact a poison control center or emergency room at once. NOTE: This medicine is only for you. Do not share this medicine with others. What if I miss a dose? It is important not to miss your dose. Call your care team if you are unable to keep an appointment. What may interact with this medication? Do not take this medication with any of the following: Deferoxamine Dimercaprol Other iron products This medication may also interact with the following: Chloramphenicol Deferasirox This list may not describe all possible interactions. Give your health care provider a list of all the medicines, herbs, non-prescription drugs, or dietary supplements you use. Also tell them if you smoke, drink alcohol, or use illegal drugs. Some items may interact with your medicine. What should I watch for while using this medication? Visit your care team regularly. Tell your care team if your symptoms do not start to get better or if they get worse. You may need blood work done while you are taking this medication. You may need to follow a special diet. Talk to your care team. Foods that contain iron include: whole grains/cereals, dried fruits, beans, or peas, leafy green vegetables, and organ meats (liver, kidney). What side effects may I notice from receiving this medication? Side effects that you should report to your care team as soon as possible: Allergic reactions--skin rash, itching, hives,  swelling of the face, lips, tongue, or throat Low blood pressure--dizziness, feeling faint or lightheaded, blurry vision Shortness of breath Side effects that usually do not require medical attention (report to your care team if they continue or are bothersome): Flushing Headache Joint pain Muscle pain Nausea Pain, redness, or irritation at injection site This list may not describe all possible side effects. Call your doctor for medical advice about side effects. You may report side effects to FDA at 1-800-FDA-1088. Where should I keep my medication? This medication is given in a hospital or clinic and will not be stored at home. NOTE: This sheet is a summary. It may not cover all possible information. If you have questions about this medicine, talk to your doctor, pharmacist, or health care provider.  2023 Elsevier/Gold Standard (2021-02-07 00:00:00)   

## 2022-04-01 ENCOUNTER — Telehealth: Payer: Self-pay | Admitting: Advanced Practice Midwife

## 2022-04-01 NOTE — Telephone Encounter (Signed)
Hello Lauren Hensley  Patient called in because she had her annual with Schuman in March. Patient try to get mammograph done but [facility] Poli said she need orders in. Thanks

## 2022-04-02 ENCOUNTER — Other Ambulatory Visit: Payer: Self-pay | Admitting: Advanced Practice Midwife

## 2022-04-02 DIAGNOSIS — Z1239 Encounter for other screening for malignant neoplasm of breast: Secondary | ICD-10-CM

## 2022-04-02 NOTE — Progress Notes (Signed)
Mammogram ordered

## 2022-06-09 ENCOUNTER — Encounter: Payer: Self-pay | Admitting: Internal Medicine

## 2022-06-09 ENCOUNTER — Inpatient Hospital Stay: Payer: 59

## 2022-06-09 ENCOUNTER — Inpatient Hospital Stay: Payer: 59 | Attending: Nurse Practitioner

## 2022-06-09 ENCOUNTER — Inpatient Hospital Stay (HOSPITAL_BASED_OUTPATIENT_CLINIC_OR_DEPARTMENT_OTHER): Payer: 59 | Admitting: Internal Medicine

## 2022-06-09 VITALS — BP 134/89 | HR 71

## 2022-06-09 DIAGNOSIS — D500 Iron deficiency anemia secondary to blood loss (chronic): Secondary | ICD-10-CM

## 2022-06-09 DIAGNOSIS — N920 Excessive and frequent menstruation with regular cycle: Secondary | ICD-10-CM | POA: Insufficient documentation

## 2022-06-09 LAB — CBC WITH DIFFERENTIAL/PLATELET
Abs Immature Granulocytes: 0.01 10*3/uL (ref 0.00–0.07)
Basophils Absolute: 0.1 10*3/uL (ref 0.0–0.1)
Basophils Relative: 2 %
Eosinophils Absolute: 0.1 10*3/uL (ref 0.0–0.5)
Eosinophils Relative: 1 %
HCT: 22.6 % — ABNORMAL LOW (ref 36.0–46.0)
Hemoglobin: 6.7 g/dL — ABNORMAL LOW (ref 12.0–15.0)
Immature Granulocytes: 0 %
Lymphocytes Relative: 45 %
Lymphs Abs: 1.7 10*3/uL (ref 0.7–4.0)
MCH: 20.1 pg — ABNORMAL LOW (ref 26.0–34.0)
MCHC: 29.6 g/dL — ABNORMAL LOW (ref 30.0–36.0)
MCV: 67.7 fL — ABNORMAL LOW (ref 80.0–100.0)
Monocytes Absolute: 0.4 10*3/uL (ref 0.1–1.0)
Monocytes Relative: 11 %
Neutro Abs: 1.5 10*3/uL — ABNORMAL LOW (ref 1.7–7.7)
Neutrophils Relative %: 41 %
Platelets: 173 10*3/uL (ref 150–400)
RBC: 3.34 MIL/uL — ABNORMAL LOW (ref 3.87–5.11)
RDW: 18.1 % — ABNORMAL HIGH (ref 11.5–15.5)
WBC: 3.8 10*3/uL — ABNORMAL LOW (ref 4.0–10.5)
nRBC: 0 % (ref 0.0–0.2)

## 2022-06-09 LAB — BASIC METABOLIC PANEL
Anion gap: 5 (ref 5–15)
BUN: 10 mg/dL (ref 6–20)
CO2: 23 mmol/L (ref 22–32)
Calcium: 8.6 mg/dL — ABNORMAL LOW (ref 8.9–10.3)
Chloride: 107 mmol/L (ref 98–111)
Creatinine, Ser: 0.61 mg/dL (ref 0.44–1.00)
GFR, Estimated: >60 mL/min (ref >60)
Glucose, Bld: 87 mg/dL (ref 70–99)
Potassium: 3.5 mmol/L (ref 3.5–5.1)
Sodium: 135 mmol/L (ref 135–145)

## 2022-06-09 MED ORDER — SODIUM CHLORIDE 0.9 % IV SOLN
Freq: Once | INTRAVENOUS | Status: AC
  Filled 2022-06-09: qty 250

## 2022-06-09 MED ORDER — SODIUM CHLORIDE 0.9 % IV SOLN
200.0000 mg | Freq: Once | INTRAVENOUS | Status: AC
  Administered 2022-06-09: 200 mg via INTRAVENOUS
  Filled 2022-06-09: qty 200

## 2022-06-09 NOTE — Assessment & Plan Note (Addendum)
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 S/p IV Venofer.    # MARCH 2023- Hb 11.2; s/p  IV Venofer in March 2023.  However today hemoglobin is 6.7.  MCV is low.  Recommend proceeding with Venofer weekly x4.  # Menorrhagia-fibroids s/p gynecology evaluation. S/p partial embolization left & right uterine artery  [Dr.Dew]-DC 2022]- STABLE.   # IV access: POOR discussed continue hydration.  We will have to consider either port placement if patient needs ongoing labs/infusions.  Patient understands need for hydration prior to IV access.  needs transportation # DISPOSITION # venofer today  # Venofer weekly x3   # follow up in 4 months- MD; labs- cbc;bmp; iron studies;ferritin; possible- venofer- Dr.B

## 2022-06-09 NOTE — Progress Notes (Signed)
[place] OFFICE PROGRESS NOTE  Patient Care Team: Homero Fellers, MD as PCP - General (Obstetrics and Gynecology) Rico Junker, RN as Registered Nurse Theodore Demark, RN (Inactive) as Registered Nurse Cammie Sickle, MD as Consulting Physician (Oncology)   Cancer Staging  No matching staging information was found for the patient.   Oncology History   No history exists.   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   #Heavy menstrual cycles-fibroids [Dr Shuman]; DEC 2022- s/p uterine embolization.    INTERVAL HISTORY: Ambulating independently.  Alone.  Lauren Hensley 56 y.o.  female pleasant patient history of iron deficient anemia/beta thalassemia-other history of fibroids/heavy menstrual cycle is here for follow-up.  Patient complains of mild to moderate fatigue. Patient continues to have mild intermittent menstrual cycles post embolization.  However not heavy.  Patient not taking oral iron; noncompliant.  Review of Systems  Constitutional:  Positive for malaise/fatigue. Negative for chills, diaphoresis, fever and weight loss.  HENT:  Negative for nosebleeds and sore throat.   Eyes:  Negative for double vision.  Respiratory:  Negative for cough, hemoptysis, sputum production, shortness of breath and wheezing.   Cardiovascular:  Negative for chest pain, palpitations, orthopnea and leg swelling.  Gastrointestinal:  Negative for abdominal pain, blood in stool, constipation, diarrhea, heartburn, melena, nausea and vomiting.  Genitourinary:  Negative for dysuria, frequency and urgency.  Musculoskeletal:  Positive for joint pain. Negative for back pain.  Skin: Negative.  Negative for itching and rash.  Neurological:  Positive for dizziness. Negative for tingling, focal weakness, weakness and headaches.  Endo/Heme/Allergies:  Does not bruise/bleed easily.  Psychiatric/Behavioral:   Negative for depression. The patient is not nervous/anxious and does not have insomnia.       PAST MEDICAL HISTORY :  Past Medical History:  Diagnosis Date   Anemia    Hypertension     PAST SURGICAL HISTORY :   Past Surgical History:  Procedure Laterality Date   COLONOSCOPY WITH PROPOFOL N/A 02/08/2021   Procedure: COLONOSCOPY WITH PROPOFOL;  Surgeon: Lin Landsman, MD;  Location: [facility] ENDOSCOPY;  Service: Gastroenterology;  Laterality: N/A;   EMBOLIZATION N/A 07/29/2021   Procedure: EMBOLIZATION;  Surgeon: Katha Cabal, MD;  Location: [place] CV LAB;  Service: Cardiovascular;  Laterality: N/A;   EMBOLIZATION N/A 09/02/2021   Procedure: EMBOLIZATION;  Surgeon: Katha Cabal, MD;  Location: [place] CV LAB;  Service: Cardiovascular;  Laterality: N/A;   Emobolization  09/02/2021   ESOPHAGOGASTRODUODENOSCOPY N/A 02/08/2021   Procedure: ESOPHAGOGASTRODUODENOSCOPY (EGD);  Surgeon: Lin Landsman, MD;  Location: [facility] ENDOSCOPY;  Service: Gastroenterology;  Laterality: N/A;   GIVENS CAPSULE STUDY N/A 02/09/2021   Procedure: GIVENS CAPSULE STUDY;  Surgeon: Lesly Rubenstein, MD;  Location: [facility] ENDOSCOPY;  Service: Endoscopy;  Laterality: N/A;    FAMILY HISTORY :   Family History  Problem Relation Age of Onset   Diabetes Mother    COPD Mother    Heart Problems Mother    Emphysema Father    Hypercholesterolemia Brother    Diabetes Maternal Aunt    Obesity Maternal Aunt    Arthritis Maternal Aunt     SOCIAL HISTORY:   Social History   Tobacco Use   Smoking status: Never   Smokeless tobacco: Never  Vaping Use   Vaping Use: Never used  Substance Use Topics   Alcohol use: Not Currently    Comment: 6-7 months ago   Drug use:  Not Currently    Types: Marijuana, Cocaine    ALLERGIES:  has No Known Allergies.  MEDICATIONS:  No current outpatient medications on file.   No current facility-administered medications for this visit.     PHYSICAL EXAMINATION:  BP 136/88 (BP Location: Left Arm, Patient Position: Sitting, Cuff Size: Large)   Pulse 67   Temp (!) 96.2 F (35.7 C) (Tympanic)   Ht '5\' 8"'$  (1.727 m)   Wt (!) 344 lb 6.4 oz (156.2 kg)   SpO2 100%   BMI 52.37 kg/m   Filed Weights   06/09/22 1302  Weight: (!) 344 lb 6.4 oz (156.2 kg)     Physical Exam Vitals and nursing note reviewed.  HENT:     Head: Normocephalic and atraumatic.     Mouth/Throat:     Pharynx: Oropharynx is clear.  Eyes:     Extraocular Movements: Extraocular movements intact.     Pupils: Pupils are equal, round, and reactive to light.  Cardiovascular:     Rate and Rhythm: Normal rate and regular rhythm.  Pulmonary:     Comments: Decreased breath sounds bilaterally.  Abdominal:     Palpations: Abdomen is soft.  Musculoskeletal:        General: Normal range of motion.     Cervical back: Normal range of motion.  Skin:    General: Skin is warm.  Neurological:     General: No focal deficit present.     Mental Status: She is alert and oriented to person, place, and time.  Psychiatric:        Behavior: Behavior normal.        Judgment: Judgment normal.        LABORATORY DATA:  I have reviewed the data as listed    Component Value Date/Time   NA 135 06/09/2022 1301   K 3.5 06/09/2022 1301   CL 107 06/09/2022 1301   CO2 23 06/09/2022 1301   GLUCOSE 87 06/09/2022 1301   BUN 10 06/09/2022 1301   CREATININE 0.61 06/09/2022 1301   CALCIUM 8.6 (L) 06/09/2022 1301   PROT 7.3 03/13/2021 1519   ALBUMIN 3.9 03/13/2021 1519   AST 15 03/13/2021 1519   ALT 11 03/13/2021 1519   ALKPHOS 33 (L) 03/13/2021 1519   BILITOT 0.9 03/13/2021 1519   GFRNONAA >60 06/09/2022 1301    No results found for: "SPEP", "UPEP"  Lab Results  Component Value Date   WBC 3.8 (L) 06/09/2022   NEUTROABS 1.5 (L) 06/09/2022   HGB 6.7 (L) 06/09/2022   HCT 22.6 (L) 06/09/2022   MCV 67.7 (L) 06/09/2022   PLT 173 06/09/2022      Chemistry       Component Value Date/Time   NA 135 06/09/2022 1301   K 3.5 06/09/2022 1301   CL 107 06/09/2022 1301   CO2 23 06/09/2022 1301   BUN 10 06/09/2022 1301   CREATININE 0.61 06/09/2022 1301      Component Value Date/Time   CALCIUM 8.6 (L) 06/09/2022 1301   ALKPHOS 33 (L) 03/13/2021 1519   AST 15 03/13/2021 1519   ALT 11 03/13/2021 1519   BILITOT 0.9 03/13/2021 1519       RADIOGRAPHIC STUDIES: I have personally reviewed the radiological images as listed and agreed with the findings in the report. No results found.   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 S/p IV Venofer.    # MARCH 2023- Hb 11.2; s/p  IV Venofer in March 2023.  Today unable to draw labs because of poor IV access.  We will have patient come back after hydration next week for Venofer.  Recommend taking gentle iron once a day.    # Menorrhagia-fibroids s/p gynecology evaluation. S/p partial embolization left & right uterine artery  [Dr.Dew]-DC 2022]- STABLE.   # IV access: POOR discussed continue hydration.  We will have to consider either port placement if patient needs ongoing labs/infusions.  However suspect if menorrhagia improves she will not need any further IV iron infusions.  needs transportation # DISPOSITION # HOLD venofer today: # venofer x1 next week.  # follow up in 3 months- MD; labs- cbc;bmp; possible- venofer- Dr.B   Orders Placed This Encounter  Procedures   CBC with Differential/Platelet    Standing Status:   Future    Standing Expiration Date:   1/63/8466   Basic metabolic panel    Standing Status:   Future    Standing Expiration Date:   06/10/2023   Iron and TIBC    Standing Status:   Future    Standing Expiration Date:   06/10/2023   Ferritin    Standing Status:   Future    Standing Expiration Date:   06/10/2023    All questions were answered. The patient knows to call the clinic with any problems, questions or concerns.      Cammie Sickle, MD 06/09/2022 8:48 PM

## 2022-06-09 NOTE — Progress Notes (Signed)
No concerns. 

## 2022-06-16 ENCOUNTER — Inpatient Hospital Stay
Admission: EM | Admit: 2022-06-16 | Discharge: 2022-06-19 | DRG: 291 | Disposition: A | Discharge status: Home / Self Care | Payer: 59 | Attending: Internal Medicine | Admitting: Internal Medicine

## 2022-06-16 ENCOUNTER — Emergency Department: Payer: 59

## 2022-06-16 ENCOUNTER — Other Ambulatory Visit: Payer: Self-pay

## 2022-06-16 DIAGNOSIS — E669 Obesity, unspecified: Secondary | ICD-10-CM | POA: Diagnosis present

## 2022-06-16 DIAGNOSIS — I517 Cardiomegaly: Secondary | ICD-10-CM | POA: Diagnosis not present

## 2022-06-16 DIAGNOSIS — D649 Anemia, unspecified: Secondary | ICD-10-CM | POA: Diagnosis present

## 2022-06-16 DIAGNOSIS — I428 Other cardiomyopathies: Secondary | ICD-10-CM

## 2022-06-16 DIAGNOSIS — I110 Hypertensive heart disease with heart failure: Principal | ICD-10-CM | POA: Diagnosis present

## 2022-06-16 DIAGNOSIS — I5023 Acute on chronic systolic (congestive) heart failure: Secondary | ICD-10-CM

## 2022-06-16 DIAGNOSIS — D509 Iron deficiency anemia, unspecified: Secondary | ICD-10-CM | POA: Diagnosis present

## 2022-06-16 DIAGNOSIS — R519 Headache, unspecified: Secondary | ICD-10-CM | POA: Diagnosis not present

## 2022-06-16 DIAGNOSIS — Z20822 Contact with and (suspected) exposure to covid-19: Secondary | ICD-10-CM | POA: Diagnosis present

## 2022-06-16 DIAGNOSIS — Z23 Encounter for immunization: Secondary | ICD-10-CM

## 2022-06-16 DIAGNOSIS — Z6841 Body Mass Index (BMI) 40.0 and over, adult: Secondary | ICD-10-CM

## 2022-06-16 DIAGNOSIS — M7989 Other specified soft tissue disorders: Secondary | ICD-10-CM | POA: Diagnosis present

## 2022-06-16 DIAGNOSIS — D62 Acute posthemorrhagic anemia: Secondary | ICD-10-CM | POA: Diagnosis present

## 2022-06-16 DIAGNOSIS — N938 Other specified abnormal uterine and vaginal bleeding: Secondary | ICD-10-CM | POA: Diagnosis present

## 2022-06-16 DIAGNOSIS — J811 Chronic pulmonary edema: Secondary | ICD-10-CM | POA: Diagnosis not present

## 2022-06-16 DIAGNOSIS — I071 Rheumatic tricuspid insufficiency: Secondary | ICD-10-CM

## 2022-06-16 DIAGNOSIS — I10 Essential (primary) hypertension: Secondary | ICD-10-CM | POA: Diagnosis not present

## 2022-06-16 DIAGNOSIS — Z8742 Personal history of other diseases of the female genital tract: Secondary | ICD-10-CM

## 2022-06-16 DIAGNOSIS — R600 Localized edema: Principal | ICD-10-CM

## 2022-06-16 DIAGNOSIS — M2578 Osteophyte, vertebrae: Secondary | ICD-10-CM | POA: Diagnosis not present

## 2022-06-16 DIAGNOSIS — I340 Nonrheumatic mitral (valve) insufficiency: Secondary | ICD-10-CM

## 2022-06-16 LAB — CBC
HCT: 22.7 % — ABNORMAL LOW (ref 36.0–46.0)
Hemoglobin: 6.3 g/dL — ABNORMAL LOW (ref 12.0–15.0)
MCH: 19.6 pg — ABNORMAL LOW (ref 26.0–34.0)
MCHC: 27.8 g/dL — ABNORMAL LOW (ref 30.0–36.0)
MCV: 70.5 fL — ABNORMAL LOW (ref 80.0–100.0)
Platelets: 161 10*3/uL (ref 150–400)
RBC: 3.22 MIL/uL — ABNORMAL LOW (ref 3.87–5.11)
RDW: 19.8 % — ABNORMAL HIGH (ref 11.5–15.5)
WBC: 4.5 10*3/uL (ref 4.0–10.5)
nRBC: 0 % (ref 0.0–0.2)

## 2022-06-16 LAB — IRON AND TIBC
Iron: 22 ug/dL — ABNORMAL LOW (ref 28–170)
Saturation Ratios: 6 % — ABNORMAL LOW (ref 10.4–31.8)
TIBC: 395 ug/dL (ref 250–450)
UIBC: 373 ug/dL

## 2022-06-16 LAB — COMPREHENSIVE METABOLIC PANEL
ALT: 12 U/L (ref 0–44)
AST: 25 U/L (ref 15–41)
Albumin: 3.6 g/dL (ref 3.5–5.0)
Alkaline Phosphatase: 34 U/L — ABNORMAL LOW (ref 38–126)
Anion gap: 6 (ref 5–15)
BUN: 10 mg/dL (ref 6–20)
CO2: 23 mmol/L (ref 22–32)
Calcium: 8.8 mg/dL — ABNORMAL LOW (ref 8.9–10.3)
Chloride: 109 mmol/L (ref 98–111)
Creatinine, Ser: 0.71 mg/dL (ref 0.44–1.00)
GFR, Estimated: >60 mL/min (ref >60)
Glucose, Bld: 82 mg/dL (ref 70–99)
Potassium: 3.7 mmol/L (ref 3.5–5.1)
Sodium: 138 mmol/L (ref 135–145)
Total Bilirubin: 0.7 mg/dL (ref 0.3–1.2)
Total Protein: 7.2 g/dL (ref 6.5–8.1)

## 2022-06-16 LAB — PREPARE RBC (CROSSMATCH)

## 2022-06-16 LAB — TSH: TSH: 2.742 u[IU]/mL (ref 0.350–4.500)

## 2022-06-16 LAB — FERRITIN: Ferritin: 16 ng/mL (ref 11–307)

## 2022-06-16 LAB — BRAIN NATRIURETIC PEPTIDE: B Natriuretic Peptide: 208.7 pg/mL — ABNORMAL HIGH (ref 0.0–100.0)

## 2022-06-16 MED ORDER — ENOXAPARIN SODIUM 80 MG/0.8ML IJ SOSY: 0.5000 mg/kg | PREFILLED_SYRINGE | INTRAMUSCULAR | Status: DC

## 2022-06-16 MED ORDER — ONDANSETRON HCL 4 MG PO TABS: 4.0000 mg | ORAL_TABLET | Freq: Four times a day (QID) | ORAL | Status: DC | PRN

## 2022-06-16 MED ORDER — ENOXAPARIN SODIUM 40 MG/0.4ML IJ SOSY: 40.0000 mg | PREFILLED_SYRINGE | INTRAMUSCULAR | Status: DC

## 2022-06-16 MED ORDER — ACETAMINOPHEN 325 MG PO TABS: 650.0000 mg | ORAL_TABLET | Freq: Four times a day (QID) | ORAL | Status: DC | PRN

## 2022-06-16 MED ORDER — ACETAMINOPHEN 650 MG RE SUPP: 650.0000 mg | Freq: Four times a day (QID) | RECTAL | Status: DC | PRN

## 2022-06-16 MED ORDER — MAGNESIUM HYDROXIDE 400 MG/5ML PO SUSP: 30.0000 mL | Freq: Every day | ORAL | Status: DC | PRN

## 2022-06-16 MED ORDER — TRAZODONE HCL 50 MG PO TABS: 25.0000 mg | ORAL_TABLET | Freq: Every evening | ORAL | Status: DC | PRN

## 2022-06-16 MED ORDER — FUROSEMIDE 40 MG PO TABS
20.0000 mg | ORAL_TABLET | ORAL | Status: AC
  Administered 2022-06-16: 20 mg via ORAL

## 2022-06-16 MED ORDER — FUROSEMIDE 10 MG/ML IJ SOLN
20.0000 mg | Freq: Two times a day (BID) | INTRAMUSCULAR | Status: DC
  Administered 2022-06-17 – 2022-06-19 (×4): 20 mg via INTRAVENOUS
  Filled 2022-06-16 (×5): qty 2

## 2022-06-16 MED ORDER — ONDANSETRON HCL 4 MG/2ML IJ SOLN: 4.0000 mg | Freq: Four times a day (QID) | INTRAMUSCULAR | Status: DC | PRN

## 2022-06-16 MED ORDER — SODIUM CHLORIDE 0.9 % IV SOLN: 10.0000 mL/h | Freq: Once | INTRAVENOUS | Status: DC

## 2022-06-16 NOTE — ED Provider Notes (Signed)
Labs reviewed, markedly notable for significant anemia with low MCV.  Hemoglobin 6.3 today lower than previous.  Discussed with Dr. Janese Banks of hematology, who I reviewed the patient's records with including evaluations with hematology recently.  Will order 2 units of blood transfusion.  Patient does report she is having headaches and fatigue and lightheadedness for the last 2 to 3 days which she has had with her previous anemia.  Given that she has new onset lower extremity edema, and at this point feel it would be helpful to exclude developing CHF and rule out DVT, it would be reasonable to admit her for blood transfusion and further work-up of her edema as well.  Patient agreeable understanding with plan.  I discussed risks benefits and alternatives to blood transfusion with the patient, she would like to proceed with transfusion having discussed these.  She does report in the past that she has received transfusions as well.   Delman Kitten, MD 06/16/22 [phone]

## 2022-06-16 NOTE — ED Provider Notes (Signed)
Consulted with hospitalist Dr. Sidney Ace, and upon discussion he will admit for further management and work-up.   Delman Kitten, MD 06/16/22 [phone]

## 2022-06-16 NOTE — Assessment & Plan Note (Signed)
Status post embolization with IR. With chronic iron deficiency anemia. Pt reports intermittent bleeding is ongoing, but its very light and minimal.  --Monitor

## 2022-06-16 NOTE — H&P (Signed)
[place]   PATIENT NAME: Mayli Covington    MR#:  [id number]  DATE OF BIRTH:  1966-06-29  DATE OF ADMISSION:  06/16/2022  PRIMARY CARE PHYSICIAN: Homero Fellers, MD   Patient is coming from: Home  REQUESTING/REFERRING PHYSICIAN: Delman Kitten, MD  CHIEF COMPLAINT:   Chief Complaint  Patient presents with   Leg Swelling    HISTORY OF PRESENT ILLNESS:  Erma Raiche is a 56 y.o. African-American female with medical history significant for hypertension and anemia, presented to the emergency room with acute onset of worsening lower extremity edema over the last couple months with acid recent generalized weakness and fatigue with dyspnea on exertion and occasional palpitations.  She admitted to tactile fever and chills but has not checked her temperature.  She has been having headache over the last couple days.  She denied any cough or wheezing.  She has been having occasional chest pain felt as pressure with no radiation or nausea or vomiting.  No dysuria, oliguria or hematuria or flank pain.  No bleeding diathesis.  ED Course: When she came to the ER, BP was 167/87 with otherwise normal vital signs.  Labs revealed unremarkable CMP.  BNP was 208.7 and iron was low at 22 and CBC showed anemia with hemoglobin 6.3 and hematocrit 22.7 compared to 6.7 and 22.7 a week ago, 11.7 and 38.5 on 12/09/2021.  CBC showed low RBC indices and TSH was 2.74 EKG as reviewed by me : EKG showed normal sinus rhythm with a rate of 64 with low voltage QRS. Imaging: Two-view chest x-ray showed unchanged mild to moderate enlarged cardiac silhouette with no interstitial edema seen.  The patient was typed crossmatch will be transfused units of packed red blood cells.  She will be admitted to an observation cardiac telemetry bed for further evaluation and management. PAST MEDICAL HISTORY:   Past Medical History:  Diagnosis Date   Anemia    Hypertension     PAST SURGICAL HISTORY:   Past Surgical  History:  Procedure Laterality Date   COLONOSCOPY WITH PROPOFOL N/A 02/08/2021   Procedure: COLONOSCOPY WITH PROPOFOL;  Surgeon: Lin Landsman, MD;  Location: [facility] ENDOSCOPY;  Service: Gastroenterology;  Laterality: N/A;   EMBOLIZATION N/A 07/29/2021   Procedure: EMBOLIZATION;  Surgeon: Katha Cabal, MD;  Location: [place] CV LAB;  Service: Cardiovascular;  Laterality: N/A;   EMBOLIZATION N/A 09/02/2021   Procedure: EMBOLIZATION;  Surgeon: Katha Cabal, MD;  Location: [place] CV LAB;  Service: Cardiovascular;  Laterality: N/A;   Emobolization  09/02/2021   ESOPHAGOGASTRODUODENOSCOPY N/A 02/08/2021   Procedure: ESOPHAGOGASTRODUODENOSCOPY (EGD);  Surgeon: Lin Landsman, MD;  Location: [facility] ENDOSCOPY;  Service: Gastroenterology;  Laterality: N/A;   GIVENS CAPSULE STUDY N/A 02/09/2021   Procedure: GIVENS CAPSULE STUDY;  Surgeon: Lesly Rubenstein, MD;  Location: [facility] ENDOSCOPY;  Service: Endoscopy;  Laterality: N/A;    SOCIAL HISTORY:   Social History   Tobacco Use   Smoking status: Never   Smokeless tobacco: Never  Substance Use Topics   Alcohol use: Not Currently    Comment: 6-7 months ago    FAMILY HISTORY:   Family History  Problem Relation Age of Onset   Diabetes Mother    COPD Mother    Heart Problems Mother    Emphysema Father    Hypercholesterolemia Brother    Diabetes Maternal Aunt    Obesity Maternal Aunt    Arthritis Maternal Aunt     DRUG ALLERGIES:  No Known Allergies  REVIEW OF SYSTEMS:   ROS As per history of present illness. All pertinent systems were reviewed above. Constitutional, HEENT, cardiovascular, respiratory, GI, GU, musculoskeletal, neuro, psychiatric, endocrine, integumentary and hematologic systems were reviewed and are otherwise negative/unremarkable except for positive findings mentioned above in the HPI.   MEDICATIONS AT HOME:   Prior to Admission medications   Medication Sig Start Date End Date  Taking? Authorizing Provider  medroxyPROGESTERone (DEPO-PROVERA) 150 MG/ML injection Inject 1 mL (150 mg total) into the muscle every 3 (three) months. Patient not taking: Reported on 04/30/2021 03/05/21 04/30/21  Homero Fellers, MD      VITAL SIGNS:  Blood pressure (!) 142/72, pulse 62, temperature 97.8 F (36.6 C), temperature source Oral, resp. rate 17, height '5\' 8"'$  (1.727 m), weight (!) 156.2 kg, last menstrual period 06/12/2022, SpO2 100 %.  PHYSICAL EXAMINATION:  Physical Exam  GENERAL:  56 y.o.-year-old African-American female patient lying in the bed with no acute distress.  EYES: Pupils equal, round, reactive to light and accommodation. No scleral icterus. Extraocular muscles intact.  HEENT: Head atraumatic, normocephalic. Oropharynx and nasopharynx clear.  NECK:  Supple, no jugular venous distention. No thyroid enlargement, no tenderness.  LUNGS: Slight diminished bibasilar breath sounds with no wheezing, rales,rhonchi or crepitation. No use of accessory muscles of respiration.  CARDIOVASCULAR: Regular rate and rhythm, S1, S2 normal. No murmurs, rubs, or gallops.  ABDOMEN: Soft, nondistended, nontender. Bowel sounds present. No organomegaly or mass.  EXTREMITIES: 2+ pedal extremity pitting edema with no cyanosis, or clubbing.  NEUROLOGIC: Cranial nerves II through XII are intact. Muscle strength 5/5 in all extremities. Sensation intact. Gait not checked.  PSYCHIATRIC: The patient is alert and oriented x 3.  Normal affect and good eye contact. SKIN: No obvious rash, lesion, or ulcer.   LABORATORY PANEL:   CBC Recent Labs  Lab 06/16/22 1436  WBC 4.5  HGB 6.3*  HCT 22.7*  PLT 161   ------------------------------------------------------------------------------------------------------------------  Chemistries  Recent Labs  Lab 06/16/22 1436  NA 138  K 3.7  CL 109  CO2 23  GLUCOSE 82  BUN 10  CREATININE 0.71  CALCIUM 8.8*  AST 25  ALT 12  ALKPHOS 34*  BILITOT  0.7   ------------------------------------------------------------------------------------------------------------------  Cardiac Enzymes No results for input(s): "TROPONINI" in the last 168 hours. ------------------------------------------------------------------------------------------------------------------  RADIOLOGY:  US Venous Img Lower Bilateral  Result Date: 06/16/2022 CLINICAL DATA:  Bilateral lower extremity edema. EXAM: BILATERAL LOWER EXTREMITY VENOUS DOPPLER ULTRASOUND TECHNIQUE: Gray-scale sonography with compression, as well as color and duplex ultrasound, were performed to evaluate the deep venous system(s) from the level of the common femoral vein through the popliteal and proximal calf veins. COMPARISON:  None Available. FINDINGS: VENOUS Normal compressibility of the common femoral, superficial femoral, and popliteal veins. Limited evaluation of the calf veins due to edema. Visualized portions of profunda femoral vein and great saphenous vein unremarkable. No filling defects to suggest DVT on grayscale or color Doppler imaging. Doppler waveforms show normal direction of venous flow, normal respiratory plasticity and response to augmentation. OTHER Baker's cyst on the left measuring 9.1 x 3.4 x 6.3 cm. Limitations: Patient body habitus IMPRESSION: 1. No evidence of DVT. 2. Left Baker's cyst measuring approximately 9.1 x 3.4 x 6.3 cm. 3. Limited evaluation of bilateral calf veins due to edema. Electronically Signed   By: Keane Police D.O.   On: 06/16/2022 17:45   DG Chest 2 View  Result Date: 06/16/2022 CLINICAL DATA:  Left lower extremity  swelling.  Edema. EXAM: CHEST - 2 VIEW COMPARISON:  Chest two views 02/01/2021 FINDINGS: Cardiac silhouette is mildly to moderately enlarged, similar to prior. Mild calcification within the aortic arch. The lungs are clear. No pleural effusion or pneumothorax. Moderate multilevel degenerative bridging osteophytosis of the thoracic spine. IMPRESSION:  Unchanged mildly to moderately enlarged cardiac silhouette. No interstitial edema seen. Electronically Signed   By: Yvonne Kendall M.D.   On: 06/16/2022 14:12      IMPRESSION AND PLAN:  Assessment and Plan: * Symptomatic anemia - The patient be admitted to a cardiac telemetry observation bed. -.  Crossmatch will be transfused 2 units of packed red blood cells. -   We will follow posttransfusion H&H. - We will check stool Hemoccults.  Acute on chronic systolic CHF (congestive heart failure) ([place]) - This could be exacerbated by her anemia. - We will gently diurese with IV Lasix with improvement of her blood pressure and when she starts PRBC transfusion. - We will follow serial troponins.  History of dysfunctional uterine bleeding - This could be contributing to her acute on chronic blood loss anemia. - We will monitor H&H. - If she has evidence of bleeding here, GYN consultation can be obtained.     DVT prophylaxis: SCDs. Advanced Care Planning:  Code Status: full code. Family Communication:  The plan of care was discussed in details with the patient (and family). I answered all questions. The patient agreed to proceed with the above mentioned plan. Further management will depend upon hospital course. Disposition Plan: Back to previous home environment Consults called: none. All the records are reviewed and case discussed with ED provider.  Status is: Observation   I certify that at the time of admission, it is my clinical judgment that the patient will require hospital care extending less than 2 midnights.                            Dispo: The patient is from: Home              Anticipated d/c is to: Home              Patient currently is not medically stable to d/c.              Difficult to place patient: No  Christel Mormon M.D on 06/16/2022 at 7:57 PM  [facility]   From 7 PM-7 AM, contact night-coverage [email or web address]  CC: Primary care physician; Homero Fellers, MD

## 2022-06-16 NOTE — ED Notes (Addendum)
See triage note  Pt presents to ED with c/o of LLE swelling that has been ongoing for a few years but states it has been getting worse over the past few days. Pt states HX of iron deficiency anemia and has a iron transfusion scheduled for Tuesday. Pt denies CP or SOB.   Pt presents with a irregular rhythm with no known HX of this, rate controlled at this time.

## 2022-06-16 NOTE — ED Provider Notes (Signed)
[facility] Provider Note    Event Date/Time   First MD Initiated Contact with Patient 06/16/22 1248     (approximate)   History   Leg Swelling   HPI  Lauren Hensley is a 56 y.o. female with history iron deficiency anemia, heavy menstrual cycles, and hypertension who presents with bilateral lower extremity swelling which has been going on for several years intermittently but is somewhat worsened and more constant over the last few weeks.  It is symmetric.  She reports occasional tingling in both feet and it is difficult to stand and walk on the legs for prolonged period of time.  She denies any associated shortness of breath, orthopnea, or paroxysmal nocturnal dyspnea.  She denies any other areas of swelling.  She also reports that she has had a mild frontal headache when waking up the last 2 days which resolves as the day goes on.    Physical Exam   Triage Vital Signs: ED Triage Vitals [06/16/22 1239]  Enc Vitals Group     BP (!) 167/87     Pulse Rate 61     Resp 18     Temp 97.9 F (36.6 C)     Temp Source Oral     SpO2 100 %     Weight (!) 344 lb 5.7 oz (156.2 kg)     Height '5\' 8"'$  (1.727 m)     Head Circumference      Peak Flow      Pain Score 8     Pain Loc      Pain Edu?      Excl. in [place]?     Most recent vital signs: Vitals:   06/16/22 1239 06/16/22 1414  BP: (!) 167/87 (!) 150/83  Pulse: 61 (!) 52  Resp: 18 10  Temp: 97.9 F (36.6 C)   SpO2: 100% 100%     General: Alert and oriented, comfortable appearing. CV:  Good peripheral perfusion.  Resp:  Normal effort. Lungs CTAB. Abd:  No distention.  Other:  2+ bilateral lower extremity edema.  No erythema, induration, or abnormal warmth.,  No open wounds or lesions.  2+ DP pulses bilaterally.     ED Results / Procedures / Treatments   Labs (all labs ordered are listed, but only abnormal results are displayed) Labs Reviewed  CBC - Abnormal; Notable for the following components:       Result Value   RBC 3.22 (*)    Hemoglobin 6.3 (*)    HCT 22.7 (*)    MCV 70.5 (*)    MCH 19.6 (*)    MCHC 27.8 (*)    RDW 19.8 (*)    All other components within normal limits  COMPREHENSIVE METABOLIC PANEL - Abnormal; Notable for the following components:   Calcium 8.8 (*)    Alkaline Phosphatase 34 (*)    All other components within normal limits  BRAIN NATRIURETIC PEPTIDE - Abnormal; Notable for the following components:   B Natriuretic Peptide 208.7 (*)    All other components within normal limits  URINALYSIS, ROUTINE W REFLEX MICROSCOPIC  TSH  TYPE AND SCREEN     EKG  ED ECG REPORT I, Arta Silence, the attending physician, personally viewed and interpreted this ECG.  Date: 06/16/2022 EKG Time: 1245 Rate: 64 Rhythm: normal sinus rhythm QRS Axis: normal Intervals: normal ST/T Wave abnormalities: normal Narrative Interpretation: no evidence of acute ischemia    RADIOLOGY  Chest x-ray: I independently viewed and  interpreted the images; there is no focal consolidation or edema  PROCEDURES:  Critical Care performed: No  Procedures   MEDICATIONS ORDERED IN ED: Medications - No data to display   IMPRESSION / MDM / [place] / ED COURSE  I reviewed the triage vital signs and the nursing notes.  56 year old female with PMH as noted above presents with bilateral lower extremity swelling which is chronic but somewhat more constant and persistent over the last few weeks.  In triage she was noted to have a somewhat fluctuating heart rate although her EKG and monitor tracing show a sinus rhythm.  I reviewed the past medical records.  The patient was most recently seen by Dr. Rogue Bussing from hematology/oncology on 9/12 for her chronic anemia.  She is scheduled for Venofer treatment this week.  On exam the patient has bilateral 2+ lower extremity edema with no other significant findings.  Differential diagnosis includes, but is not limited to,  peripheral edema, lymphedema, CHF, or fluid overload due to CKD or liver disease.  Given the chronic and symmetrical nature of the symptoms it is not consistent with DVT.  There is no evidence of cellulitis.  Patient's presentation is most consistent with acute complicated illness / injury requiring diagnostic workup.  We will obtain chest x-ray, lab work-up including BNP and reassess.  The patient is on the cardiac monitor to evaluate for evidence of arrhythmia and/or significant heart rate changes.   ----------------------------------------- 3:57 PM on 06/16/2022 -----------------------------------------  Lab work-up pending.  I signed the patient out to the oncoming ED physician Dr. Jacqualine Code.    FINAL CLINICAL IMPRESSION(S) / ED DIAGNOSES   Final diagnoses:  Leg edema     Rx / DC Orders   ED Discharge Orders     None        Note:  This document was prepared using Dragon voice recognition software and may include unintentional dictation errors.    Arta Silence, MD 06/16/22 1559

## 2022-06-16 NOTE — ED Triage Notes (Signed)
Pt here with bilateral leg swelling for a while per pt. Pt states she has having issues with walking and she now having tingling in her feet now as well. Pt also c/o recurrent headaches when she wakes up and wants to be evaluated for that as well. Pt ambulatory to triage.

## 2022-06-16 NOTE — Assessment & Plan Note (Addendum)
Prior echo from May 2022 showed EF 45-50%, moderate MR, mod-severe TR, normal diastolic parameters. Edema resolved with diuresis.  Net IO Since Admission: 1,153.42 mL [06/19/22 1755]  Treated with IV Lasix 20 mg BID. Discharge on oral Lasix 40 mg daily with small dose of potassium replacement Recommend daily weights at home. Pt given [facility] application to establish for primary care. Referral for Heart Failure clinic was also requested. Dietary recommendations and education included in d/c packet for patient Repeat BMP, Mg in 1-2 weeks as outpatient  9/22 - lower extremity edema resolved.  Pt clinically improved and stable for discharge.

## 2022-06-16 NOTE — Progress Notes (Signed)
PHARMACIST - PHYSICIAN COMMUNICATION  CONCERNING:  Enoxaparin (Lovenox) for DVT Prophylaxis    RECOMMENDATION: Patient was prescribed enoxaprin '40mg'$  q24 hours for VTE prophylaxis.   Filed Weights   06/16/22 1239  Weight: (!) 156.2 kg (344 lb 5.7 oz)    Body mass index is 52.36 kg/m.  Estimated Creatinine Clearance: 129.4 mL/min (by C-G formula based on SCr of 0.71 mg/dL).   Based on [place] patient is candidate for enoxaparin 0.'5mg'$ /kg TBW SQ every 24 hours based on BMI being >30.  DESCRIPTION: Pharmacy has adjusted enoxaparin dose per [facility] policy.  Patient is now receiving enoxaparin 0.5 mg/kg every 24 hours    Dallie Piles, PharmD Clinical Pharmacist  06/16/2022 6:38 PM

## 2022-06-16 NOTE — Assessment & Plan Note (Addendum)
Hx of iron deficiency anemia, follows with hematology for iron infusions, most recent one done on 06/09/22. --Transfused pRBC's on admission for Hbg 6.7 >> 6.3.  Hemoglobin continues to improve, 9.2 today. --Given IV iron on 9/21 -- Follow-up with Dr. Rogue Bussing as scheduled for outpatient iron infusions

## 2022-06-16 NOTE — ED Notes (Signed)
US at bedside

## 2022-06-17 ENCOUNTER — Encounter: Payer: Self-pay | Admitting: Family Medicine

## 2022-06-17 DIAGNOSIS — D649 Anemia, unspecified: Secondary | ICD-10-CM | POA: Diagnosis not present

## 2022-06-17 DIAGNOSIS — I110 Hypertensive heart disease with heart failure: Secondary | ICD-10-CM | POA: Diagnosis not present

## 2022-06-17 DIAGNOSIS — M7989 Other specified soft tissue disorders: Secondary | ICD-10-CM | POA: Diagnosis not present

## 2022-06-17 DIAGNOSIS — Z23 Encounter for immunization: Secondary | ICD-10-CM | POA: Diagnosis not present

## 2022-06-17 DIAGNOSIS — D62 Acute posthemorrhagic anemia: Secondary | ICD-10-CM | POA: Diagnosis not present

## 2022-06-17 DIAGNOSIS — I5023 Acute on chronic systolic (congestive) heart failure: Secondary | ICD-10-CM | POA: Diagnosis not present

## 2022-06-17 DIAGNOSIS — Z20822 Contact with and (suspected) exposure to covid-19: Secondary | ICD-10-CM | POA: Diagnosis not present

## 2022-06-17 DIAGNOSIS — Z6841 Body Mass Index (BMI) 40.0 and over, adult: Secondary | ICD-10-CM | POA: Diagnosis not present

## 2022-06-17 DIAGNOSIS — R519 Headache, unspecified: Secondary | ICD-10-CM | POA: Diagnosis not present

## 2022-06-17 DIAGNOSIS — N938 Other specified abnormal uterine and vaginal bleeding: Secondary | ICD-10-CM | POA: Diagnosis not present

## 2022-06-17 DIAGNOSIS — R600 Localized edema: Secondary | ICD-10-CM | POA: Diagnosis not present

## 2022-06-17 DIAGNOSIS — E669 Obesity, unspecified: Secondary | ICD-10-CM | POA: Diagnosis not present

## 2022-06-17 DIAGNOSIS — D509 Iron deficiency anemia, unspecified: Secondary | ICD-10-CM | POA: Diagnosis not present

## 2022-06-17 HISTORY — DX: Headache, unspecified: R51.9

## 2022-06-17 LAB — BPAM RBC
Blood Product Expiration Date: 202310092359
Blood Product Expiration Date: 202310102359
ISSUE DATE / TIME: 202309191814
ISSUE DATE / TIME: 202309192159
Unit Type and Rh: 7300
Unit Type and Rh: 7300

## 2022-06-17 LAB — TYPE AND SCREEN
ABO/RH(D): B POS
Antibody Screen: NEGATIVE
Unit division: 0
Unit division: 0

## 2022-06-17 LAB — CBC
HCT: 25.9 % — ABNORMAL LOW (ref 36.0–46.0)
Hemoglobin: 7.5 g/dL — ABNORMAL LOW (ref 12.0–15.0)
MCH: 21.7 pg — ABNORMAL LOW (ref 26.0–34.0)
MCHC: 29 g/dL — ABNORMAL LOW (ref 30.0–36.0)
MCV: 74.9 fL — ABNORMAL LOW (ref 80.0–100.0)
Platelets: 156 10*3/uL (ref 150–400)
RBC: 3.46 MIL/uL — ABNORMAL LOW (ref 3.87–5.11)
RDW: 20.4 % — ABNORMAL HIGH (ref 11.5–15.5)
WBC: 4.3 10*3/uL (ref 4.0–10.5)
nRBC: 0 % (ref 0.0–0.2)

## 2022-06-17 LAB — BASIC METABOLIC PANEL
Anion gap: 4 — ABNORMAL LOW (ref 5–15)
BUN: 9 mg/dL (ref 6–20)
CO2: 23 mmol/L (ref 22–32)
Calcium: 8.6 mg/dL — ABNORMAL LOW (ref 8.9–10.3)
Chloride: 113 mmol/L — ABNORMAL HIGH (ref 98–111)
Creatinine, Ser: 0.73 mg/dL (ref 0.44–1.00)
GFR, Estimated: >60 mL/min (ref >60)
Glucose, Bld: 99 mg/dL (ref 70–99)
Potassium: 4.2 mmol/L (ref 3.5–5.1)
Sodium: 140 mmol/L (ref 135–145)

## 2022-06-17 LAB — HIV ANTIBODY (ROUTINE TESTING W REFLEX): HIV Screen 4th Generation wRfx: NONREACTIVE

## 2022-06-17 LAB — URINALYSIS, ROUTINE W REFLEX MICROSCOPIC
Bilirubin Urine: NEGATIVE
Glucose, UA: NEGATIVE mg/dL
Ketones, ur: NEGATIVE mg/dL
Leukocytes,Ua: NEGATIVE
Nitrite: NEGATIVE
Protein, ur: NEGATIVE mg/dL
Specific Gravity, Urine: 1.004 — ABNORMAL LOW (ref 1.005–1.030)
pH: 8 (ref 5.0–8.0)

## 2022-06-17 LAB — HEMOGLOBIN AND HEMATOCRIT, BLOOD
HCT: 26.1 % — ABNORMAL LOW (ref 36.0–46.0)
Hemoglobin: 7.8 g/dL — ABNORMAL LOW (ref 12.0–15.0)

## 2022-06-17 MED ORDER — ACETAMINOPHEN 325 MG PO TABS: 650.0000 mg | ORAL_TABLET | Freq: Four times a day (QID) | ORAL | Status: DC | PRN

## 2022-06-17 MED ORDER — INFLUENZA VAC SPLIT QUAD 0.5 ML IM SUSY
0.5000 mL | PREFILLED_SYRINGE | INTRAMUSCULAR | Status: AC
  Administered 2022-06-19: 0.5 mL via INTRAMUSCULAR
  Filled 2022-06-17: qty 0.5

## 2022-06-17 MED ORDER — ACETAMINOPHEN 650 MG RE SUPP: 650.0000 mg | Freq: Four times a day (QID) | RECTAL | Status: DC | PRN

## 2022-06-17 NOTE — Progress Notes (Signed)
Patient placed on overnight pulse Ox for sleep study, on room air. RN notified. SpO2 98%, HR 65, RR 17.

## 2022-06-17 NOTE — Discharge Instructions (Addendum)

## 2022-06-17 NOTE — Progress Notes (Signed)
  Progress Note   Patient: Lauren Hensley YTK:[id number] DOB: Jun 01, 1966 DOA: 06/16/2022     0 DOS: the patient was seen and examined on 06/17/2022   Brief hospital course: 56 y.o. female with medical history significant for hypertension and iron deficiency anemia followed by hematology for iron infusions (most recent done on 06/09/22), hx of abnormal uterine bleeding s/p embolization presented to the ED on 06/16/2022 for evaluation of worsening lower extremity edema over the last couple months, also with progressive generalized weakness and fatigue with dyspnea on exertion and occasional palpitations.    She was admitted with symptomatic anemia with Hbg of 6.7 >> 6.3.  Packed RBC's were transfused on admission with Hbg improvement.  She was started on IV Lasix for acute on chronic systolic CHF.  Assessment and Plan: * Symptomatic anemia Hx of iron deficiency anemia, follows with hematology for iron infusions, most recent one done on 06/09/22. --Transfused pRBC's on admission for Hbg 6.7 >> 6.3. --Hbg improved as expected --Monitor Hbg & transfuse if < 7 --Iron infusion tomorrow, as Dr. Rogue Bussing had planned to do weekly x 3  Acute on chronic systolic CHF (congestive heart failure) ([place]) Prior echo from May 2022 showed EF 45-50%, moderate MR, mod-severe TR, normal diastolic parameters. Edema improving with diuresis. Net IO Since Admission: 795.42 mL [06/17/22 1817] --Continue IV Lasix --Strict I/O's and daily weights --Monitor renal function and electrolytes  History of dysfunctional uterine bleeding Status post embolization with IR. With chronic iron deficiency anemia. Pt reports intermittent bleeding is ongoing, but its very light and minimal.  --Monitor  Headache Pt reports AM headache today, has them occasionally, in addition to daytime fatigue, snoring.  Suspect underlying sleep apnea. --Outpatient sleep study recommended --TOC consult for PCP establishing --Overnight pulse  oximetry --Nocturnal home o2 if she qualifies --Tylenol PRN        Subjective: Pt was awake resting in bed when seen this AM.  She reports improved leg swelling.  Has not had any significant bleeding including uterine (very light and minimal), bloody or dark stools, hematemesis or hematuria.  No other acute complaints.   Physical Exam: Vitals:   06/17/22 0500 06/17/22 0642 06/17/22 0758 06/17/22 1236  BP:  (!) 113/57 (!) 107/44 (!) 122/57  Pulse:  64 (!) 58 60  Resp:  '20 17 14  '$ Temp:   97.7 F (36.5 C) 98.2 F (36.8 C)  TempSrc:    Oral  SpO2:  98% 100% 100%  Weight: (!) 154.6 kg     Height:       General exam: awake, alert, no acute distress, obese HEENT: moist mucus membranes, hearing grossly normal  Respiratory system: CTAB, no wheezes, rales or rhonchi, normal respiratory effort. Cardiovascular system: normal S1/S2, RRR, 1+ bilateral lower extremity and pedal edema.   Gastrointestinal system: soft, NT, ND Central nervous system: A&O x4. no gross focal neurologic deficits, normal speech Skin: dry, intact, normal temperature Psychiatry: normal mood, congruent affect, judgement and insight appear normal   Data Reviewed:  Notable labs --- Cl 113, Ca 8.6, Hbg 7.5 >> 7.8 this afternoon    Family Communication: none  Disposition: Status is: Inpatient Remains inpatient appropriate because: ongoing IV diuresis pending further improvement in edema, closely monitor anemia which required blood transfusion   Planned Discharge Destination: Home    Time spent: 40 minutes  Author: Ezekiel Slocumb, DO 06/17/2022 6:22 PM  For on call review [email or web address].

## 2022-06-17 NOTE — Consult Note (Signed)
   Heart Failure Nurse Navigator Note  HfmrEF 45 to 50%.  Moderate mitral regurgitation.  She presented to the emergency room with complaints of worsening lower extremity edema, weakness, and dyspnea on exertion.  BNP was 208.  Comorbidities:  Hypertension Anemia  Medications:  Frusemide 20 mg IV every 12   Labs:  Sodium 140, potassium 4.2, chloride 113, CO2 23, BUN 9, creatinine 0.73, estimated GFR greater than 60. Weight is 154.6 kg Blood pressure 104/77 Intake 795 mL Output not documented   Initial meeting with patient.  She states that she has not heard the term heart failure in relationship to herself.  Discussed heart failure and what it means along with function of her heart.  He states that she lives with her brother and a niece.  When it comes to meal preparation they will fix their own.  She does admit to using this small amount of salt that she likes on [place] fries.  She felt that she picks up lean meats and does not use canned vegetables but fresh or frozen.  She states she mostly prepares her meals and an air [place].  Discussed the relationship to salt sodium and fluids.  Went over the importance of fluid restriction of 64 ounces in a days time.  States on the days that she does have her blood transfusions that she will drink anywhere from 3 to 4-16 ounce bottles of water she is a difficult stick.  She states that she gets her transportation through [place].  States that she is in the process of getting Medicare and SSI and new medications would put a financial stress on her.  Discussed follow-up in the outpatient heart failure clinic for which she has an appointment on September 28 at 8:30 in the morning.  She is a 14% no-show which is 12 out of 88 appointments.  Was given the living with heart failure teaching booklet, zone magnet, info on low-sodium and heart failure along with weight chart.  She had no further questions.  We will follow along.  Pricilla Riffle RN  CHFN

## 2022-06-17 NOTE — Assessment & Plan Note (Addendum)
Pt reports AM headache today, has them occasionally, in addition to daytime fatigue, snoring.  Suspect underlying sleep apnea. --Outpatient sleep study recommended --TOC consult for PCP establishing - provided [facility] applications --Nocturnal pulse oximetry study performed and no significant O2 desaturations occurred --Tylenol PRN

## 2022-06-17 NOTE — Progress Notes (Signed)
Nutrition Brief Note  RD pulled to chart secondary to CHF.   Wt Readings from Last 15 Encounters:  06/17/22 (!) 154.6 kg  06/09/22 (!) 156.2 kg  03/10/22 (!) 153.3 kg  12/09/21 (!) 155.5 kg  11/28/21 (!) 154.2 kg  09/26/21 (!) 146.7 kg  09/08/21 (!) 139.6 kg  09/02/21 136.1 kg  08/14/21 (!) 137.4 kg  08/05/21 (!) 141.5 kg  07/29/21 136.1 kg  07/07/21 (!) 139.3 kg  06/27/21 (!) 139.7 kg  06/23/21 (!) 139.7 kg  05/08/21 132 kg   Pt with medical history significant for hypertension and anemia, presented with acute onset of worsening lower extremity edema over the last couple months with acid recent generalized weakness and fatigue with dyspnea on exertion and occasional palpitations.   RD provided "Low Sodium Nutrition Therapy" handout from [facility] Nutrition Care Manual; attached to AVS/ discharge summary.  Body mass index is 51.82 kg/m. Patient meets criteria for obesity, class II based on current BMI. Obesity is a complex, chronic medical condition that is optimally managed by a multidisciplinary care team. Weight loss is not an ideal goal for an acute inpatient hospitalization. However, if further work-up for obesity is warranted, consider outpatient referral to outpatient bariatric service and/or [facility].    Current diet order is Heart Healthy (liberalized to 2 gram sodium), patient is consuming approximately n/a% of meals at this time. Labs and medications reviewed.   No nutrition interventions warranted at this time. If nutrition issues arise, please consult RD.   Loistine Chance, RD, LDN, [place] Registered Dietitian II Certified Diabetes Care and Education Specialist Please refer to [facility] for RD and/or RD on-call/weekend/after hours pager

## 2022-06-17 NOTE — Hospital Course (Signed)
56 y.o. female with medical history significant for hypertension and iron deficiency anemia followed by hematology for iron infusions (most recent done on 06/09/22), hx of abnormal uterine bleeding s/p embolization presented to the ED on 06/16/2022 for evaluation of worsening lower extremity edema over the last couple months, also with progressive generalized weakness and fatigue with dyspnea on exertion and occasional palpitations.    She was admitted with symptomatic anemia with Hbg of 6.7 >> 6.3.  Packed RBC's were transfused on admission with Hbg improvement.  She was started on IV Lasix for acute on chronic systolic CHF.

## 2022-06-18 ENCOUNTER — Inpatient Hospital Stay: Payer: 59

## 2022-06-18 DIAGNOSIS — D649 Anemia, unspecified: Secondary | ICD-10-CM | POA: Diagnosis not present

## 2022-06-18 LAB — BASIC METABOLIC PANEL
Anion gap: 6 (ref 5–15)
BUN: 11 mg/dL (ref 6–20)
CO2: 24 mmol/L (ref 22–32)
Calcium: 9 mg/dL (ref 8.9–10.3)
Chloride: 110 mmol/L (ref 98–111)
Creatinine, Ser: 0.68 mg/dL (ref 0.44–1.00)
GFR, Estimated: >60 mL/min (ref >60)
Glucose, Bld: 88 mg/dL (ref 70–99)
Potassium: 3.9 mmol/L (ref 3.5–5.1)
Sodium: 140 mmol/L (ref 135–145)

## 2022-06-18 LAB — CBC
HCT: 27.1 % — ABNORMAL LOW (ref 36.0–46.0)
Hemoglobin: 8.2 g/dL — ABNORMAL LOW (ref 12.0–15.0)
MCH: 21.5 pg — ABNORMAL LOW (ref 26.0–34.0)
MCHC: 30.3 g/dL (ref 30.0–36.0)
MCV: 70.9 fL — ABNORMAL LOW (ref 80.0–100.0)
Platelets: 185 10*3/uL (ref 150–400)
RBC: 3.82 MIL/uL — ABNORMAL LOW (ref 3.87–5.11)
RDW: 20.5 % — ABNORMAL HIGH (ref 11.5–15.5)
WBC: 4.5 10*3/uL (ref 4.0–10.5)
nRBC: 0 % (ref 0.0–0.2)

## 2022-06-18 MED ORDER — SODIUM CHLORIDE 0.9 % IV SOLN
200.0000 mg | Freq: Once | INTRAVENOUS | Status: AC
  Administered 2022-06-18: 200 mg via INTRAVENOUS
  Filled 2022-06-18: qty 200

## 2022-06-18 NOTE — Progress Notes (Signed)
Progress Note   Patient: Lauren Hensley PJA:[id number] DOB: [birth year] DOA: 06/16/2022     1 DOS: the patient was seen and examined on 06/18/2022   Brief hospital course: 56 y.o. female with medical history significant for hypertension and iron deficiency anemia followed by hematology for iron infusions (most recent done on 06/09/22), hx of abnormal uterine bleeding s/p embolization presented to the ED on 06/16/2022 for evaluation of worsening lower extremity edema over the last couple months, also with progressive generalized weakness and fatigue with dyspnea on exertion and occasional palpitations.    She was admitted with symptomatic anemia with Hbg of 6.7 >> 6.3.  Packed RBC's were transfused on admission with Hbg improvement.  She was started on IV Lasix for acute on chronic systolic CHF.  Assessment and Plan: * Symptomatic anemia Hx of iron deficiency anemia, follows with hematology for iron infusions, most recent one done on 06/09/22. --Transfused pRBC's on admission for Hbg 6.7 >> 6.3.  Hemoglobin continues to improve, 8.2 today. --Hbg improved as expected --Monitor Hbg & transfuse if < 7 --Iron infusion today -- Follow-up with Dr. Rogue Bussing as scheduled  Acute on chronic systolic CHF (congestive heart failure) ([place]) Prior echo from May 2022 showed EF 45-50%, moderate MR, mod-severe TR, normal diastolic parameters. Edema improving with diuresis. Net IO Since Admission: 1,035.42 mL [06/18/22 1521]  9/20: Renal function stable, still some LE edema remaining. --Continue IV Lasix --Strict I/O's and daily weights --Monitor renal function and electrolytes  History of dysfunctional uterine bleeding Status post embolization with IR. With chronic iron deficiency anemia. Pt reports intermittent bleeding is ongoing, but its very light and minimal.  --Monitor  Headache Pt reports AM headache today, has them occasionally, in addition to daytime fatigue, snoring.  Suspect underlying sleep  apnea. --Outpatient sleep study recommended --TOC consult for PCP establishing --Nocturnal pulse oximetry study performed and no significant O2 desaturations occurred --Tylenol PRN        Subjective: Pt was awake resting in bed when seen this AM.  She ambulated with mobility tech earlier and tolerated fairly well.  Still has swelling in her legs and feet which causes her some discomfort when ambulating.  Otherwise reports feeling overall well and was happy to see hemoglobin improving.  No other acute complaints at this time.  Physical Exam: Vitals:   06/17/22 1236 06/17/22 1939 06/18/22 0629 06/18/22 0854  BP: (!) 122/57 137/81 (!) 114/43 126/61  Pulse: 60 75 (!) 55 (!) 58  Resp: '14 20 18   '$ Temp: 98.2 F (36.8 C) 98.1 F (36.7 C) 98.5 F (36.9 C) 98.4 F (36.9 C)  TempSrc: Oral  Oral Oral  SpO2: 100% 96% 100% 100%  Weight:      Height:       General exam: awake, alert, no acute distress, obese HEENT: moist mucus membranes, hearing grossly normal  Respiratory system: CTAB, no wheezes, rales or rhonchi, normal respiratory effort. Cardiovascular system: normal S1/S2, RRR, 1+ bilateral lower extremity and pedal edema.   Gastrointestinal system: soft, NT, ND Central nervous system: A&O x4. no gross focal neurologic deficits, normal speech Skin: dry, intact, normal temperature Psychiatry: normal mood, congruent affect, judgement and insight appear normal   Data Reviewed:  Notable labs --- hemoglobin improved to 8.2 from 7.8   Family Communication: none  Disposition: Status is: Inpatient Remains inpatient appropriate because: ongoing IV diuresis pending further improvement in edema   Planned Discharge Destination: Home    Time spent: 40 minutes  Author: Ezekiel Slocumb,  DO 06/18/2022 3:23 PM  For on call review [email or web address].

## 2022-06-18 NOTE — Progress Notes (Signed)
Mobility Specialist - Progress Note   06/18/22 0953  Mobility  Activity Ambulated with assistance in hallway;Stood at bedside;Dangled on edge of bed  Level of Assistance Standby assist, set-up cues, supervision of patient - no hands on  Assistive Device None  Distance Ambulated (ft) 160 ft  Activity Response Tolerated well  $Mobility charge 1 Mobility   Pt supine in bed on RA upon arrival. Pt STS and ambulates 1 lap around NS SBA. Pt returns to bed with needs in reach.  Gretchen Short  Mobility Specialist  06/18/22 9:54 AM

## 2022-06-19 ENCOUNTER — Encounter: Payer: Self-pay | Admitting: Internal Medicine

## 2022-06-19 ENCOUNTER — Other Ambulatory Visit: Payer: Self-pay

## 2022-06-19 DIAGNOSIS — D649 Anemia, unspecified: Secondary | ICD-10-CM | POA: Diagnosis not present

## 2022-06-19 LAB — BASIC METABOLIC PANEL
Anion gap: 8 (ref 5–15)
BUN: 11 mg/dL (ref 6–20)
CO2: 26 mmol/L (ref 22–32)
Calcium: 9.2 mg/dL (ref 8.9–10.3)
Chloride: 105 mmol/L (ref 98–111)
Creatinine, Ser: 0.74 mg/dL (ref 0.44–1.00)
GFR, Estimated: >60 mL/min (ref >60)
Glucose, Bld: 91 mg/dL (ref 70–99)
Potassium: 4 mmol/L (ref 3.5–5.1)
Sodium: 139 mmol/L (ref 135–145)

## 2022-06-19 LAB — CBC
HCT: 30.2 % — ABNORMAL LOW (ref 36.0–46.0)
Hemoglobin: 9.2 g/dL — ABNORMAL LOW (ref 12.0–15.0)
MCH: 21.6 pg — ABNORMAL LOW (ref 26.0–34.0)
MCHC: 30.5 g/dL (ref 30.0–36.0)
MCV: 70.9 fL — ABNORMAL LOW (ref 80.0–100.0)
Platelets: 208 10*3/uL (ref 150–400)
RBC: 4.26 MIL/uL (ref 3.87–5.11)
RDW: 20.7 % — ABNORMAL HIGH (ref 11.5–15.5)
WBC: 4.8 10*3/uL (ref 4.0–10.5)
nRBC: 0.4 % — ABNORMAL HIGH (ref 0.0–0.2)

## 2022-06-19 LAB — MAGNESIUM: Magnesium: 1.8 mg/dL (ref 1.7–2.4)

## 2022-06-19 MED ORDER — POTASSIUM CHLORIDE CRYS ER 20 MEQ PO TBCR
20.0000 meq | EXTENDED_RELEASE_TABLET | Freq: Every day | ORAL | 2 refills | Status: DC
  Filled 2022-06-19: qty 30, 30d supply, fill #0
  Filled 2022-07-15: qty 30, 30d supply, fill #1
  Filled 2022-08-16: qty 30, 30d supply, fill #2

## 2022-06-19 MED ORDER — FUROSEMIDE 20 MG PO TABS
20.0000 mg | ORAL_TABLET | Freq: Every day | ORAL | 2 refills | Status: DC
  Filled 2022-06-19: qty 30, 30d supply, fill #0
  Filled 2022-07-15: qty 30, 30d supply, fill #1
  Filled 2022-08-16: qty 30, 30d supply, fill #2

## 2022-06-19 NOTE — Discharge Summary (Signed)
Physician Discharge Summary   Patient: Lauren Hensley MRN: [id number] DOB: 07-15-1966  Admit date:     06/16/2022  Discharge date: 06/19/22  Discharge Physician: Ezekiel Slocumb   PCP: Homero Fellers, MD   Recommendations at discharge:    Follow up and establish with Primary Care Repeat BMP, Mg, CBC in 1-2 weeks Follow up with Hematology for iron infusions as instructed Follow up at Heart Failure clinic  Discharge Diagnoses: Principal Problem:   Symptomatic anemia Active Problems:   Acute on chronic systolic CHF (congestive heart failure) ([place])   History of dysfunctional uterine bleeding  Resolved Problems:   Headache  Hospital Course: 56 y.o. female with medical history significant for hypertension and iron deficiency anemia followed by hematology for iron infusions (most recent done on 06/09/22), hx of abnormal uterine bleeding s/p embolization presented to the ED on 06/16/2022 for evaluation of worsening lower extremity edema over the last couple months, also with progressive generalized weakness and fatigue with dyspnea on exertion and occasional palpitations.    She was admitted with symptomatic anemia with Hbg of 6.7 >> 6.3.  Packed RBC's were transfused on admission with Hbg improvement.  She was started on IV Lasix for acute on chronic systolic CHF.  Assessment and Plan: * Symptomatic anemia Hx of iron deficiency anemia, follows with hematology for iron infusions, most recent one done on 06/09/22. --Transfused pRBC's on admission for Hbg 6.7 >> 6.3.  Hemoglobin continues to improve, 9.2 today. --Given IV iron on 9/21 -- Follow-up with Dr. Rogue Bussing as scheduled for outpatient iron infusions  Acute on chronic systolic CHF (congestive heart failure) ([place]) Prior echo from May 2022 showed EF 45-50%, moderate MR, mod-severe TR, normal diastolic parameters. Edema resolved with diuresis.  Net IO Since Admission: 1,153.42 mL [06/19/22 1755]  Treated with IV Lasix 20  mg BID. Discharge on oral Lasix 40 mg daily with small dose of potassium replacement Recommend daily weights at home. Pt given [facility] application to establish for primary care. Referral for Heart Failure clinic was also requested. Dietary recommendations and education included in d/c packet for patient Repeat BMP, Mg in 1-2 weeks as outpatient  9/22 - lower extremity edema resolved.  Pt clinically improved and stable for discharge.  History of dysfunctional uterine bleeding Status post embolization with IR. With chronic iron deficiency anemia. Pt reports intermittent bleeding is ongoing, but its very light and minimal.  --Monitor  Headache-resolved as of 06/19/2022 Pt reports AM headache today, has them occasionally, in addition to daytime fatigue, snoring.  Suspect underlying sleep apnea. --Outpatient sleep study recommended --TOC consult for PCP establishing - provided [facility] applications --Nocturnal pulse oximetry study performed and no significant O2 desaturations occurred --Tylenol PRN         Consultants: None Procedures performed: Echo   Disposition: Home Diet recommendation:  Discharge Diet Orders (From admission, onward)     Start     Ordered   06/19/22 0000  Diet - low sodium heart healthy        06/19/22 1058           Cardiac diet DISCHARGE MEDICATION: Allergies as of 06/19/2022   No Known Allergies      Medication List     TAKE these medications    furosemide 20 MG tablet Commonly known as: Lasix Take 1 tablet (20 mg total) by mouth daily.   potassium chloride SA 20 MEQ tablet Commonly known as: KLOR-CON M Take 1 tablet (20 mEq total)  by mouth daily.        Discharge Exam: Filed Weights   06/16/22 2101 06/17/22 0500 06/19/22 0559  Weight: (!) 154.1 kg (!) 154.6 kg (!) 148.3 kg   General exam: awake, alert, no acute distress, obese HEENT: atraumatic, clear conjunctiva, anicteric sclera, moist mucus membranes,  hearing grossly normal  Respiratory system: CTAB, no wheezes, rales or rhonchi, normal respiratory effort. Cardiovascular system: normal S1/S2, RRR, resolved lower extremity / pedal edema.   Central nervous system: A&O x3. no gross focal neurologic deficits, normal speech Extremities: moves all, no edema, normal tone Skin: dry, intact, normal temperature Psychiatry: normal mood, congruent affect, judgement and insight appear normal   Condition at discharge: stable  The results of significant diagnostics from this hospitalization (including imaging, microbiology, ancillary and laboratory) are listed below for reference.   Imaging Studies: US Venous Img Lower Bilateral  Result Date: 06/16/2022 CLINICAL DATA:  Bilateral lower extremity edema. EXAM: BILATERAL LOWER EXTREMITY VENOUS DOPPLER ULTRASOUND TECHNIQUE: Gray-scale sonography with compression, as well as color and duplex ultrasound, were performed to evaluate the deep venous system(s) from the level of the common femoral vein through the popliteal and proximal calf veins. COMPARISON:  None Available. FINDINGS: VENOUS Normal compressibility of the common femoral, superficial femoral, and popliteal veins. Limited evaluation of the calf veins due to edema. Visualized portions of profunda femoral vein and great saphenous vein unremarkable. No filling defects to suggest DVT on grayscale or color Doppler imaging. Doppler waveforms show normal direction of venous flow, normal respiratory plasticity and response to augmentation. OTHER Baker's cyst on the left measuring 9.1 x 3.4 x 6.3 cm. Limitations: Patient body habitus IMPRESSION: 1. No evidence of DVT. 2. Left Baker's cyst measuring approximately 9.1 x 3.4 x 6.3 cm. 3. Limited evaluation of bilateral calf veins due to edema. Electronically Signed   By: Keane Police D.O.   On: 06/16/2022 17:45   DG Chest 2 View  Result Date: 06/16/2022 CLINICAL DATA:  Left lower extremity swelling.  Edema. EXAM:  CHEST - 2 VIEW COMPARISON:  Chest two views 02/01/2021 FINDINGS: Cardiac silhouette is mildly to moderately enlarged, similar to prior. Mild calcification within the aortic arch. The lungs are clear. No pleural effusion or pneumothorax. Moderate multilevel degenerative bridging osteophytosis of the thoracic spine. IMPRESSION: Unchanged mildly to moderately enlarged cardiac silhouette. No interstitial edema seen. Electronically Signed   By: Yvonne Kendall M.D.   On: 06/16/2022 14:12    Microbiology: Results for orders placed or performed during the hospital encounter of 02/01/21  Resp Panel by RT-PCR (Flu A&B, Covid) Nasopharyngeal Swab     Status: None   Collection Time: 02/03/21  8:37 AM   Specimen: Nasopharyngeal Swab; Nasopharyngeal(NP) swabs in vial transport medium  Result Value Ref Range Status   SARS Coronavirus 2 by RT PCR NEGATIVE NEGATIVE Final    Comment: (NOTE) SARS-CoV-2 target nucleic acids are NOT DETECTED.  The SARS-CoV-2 RNA is generally detectable in upper respiratory specimens during the acute phase of infection. The lowest concentration of SARS-CoV-2 viral copies this assay can detect is 138 copies/mL. A negative result does not preclude SARS-Cov-2 infection and should not be used as the sole basis for treatment or other patient management decisions. A negative result may occur with  improper specimen collection/handling, submission of specimen other than nasopharyngeal swab, presence of viral mutation(s) within the areas targeted by this assay, and inadequate number of viral copies(<138 copies/mL). A negative result must be combined with clinical observations, patient history, and  epidemiological information. The expected result is Negative.  Fact Sheet for Patients:  EntrepreneurPulse.com.au  Fact Sheet for Healthcare Providers:  IncredibleEmployment.be  This test is no t yet approved or cleared by the [place] FDA and  has  been authorized for detection and/or diagnosis of SARS-CoV-2 by FDA under an Emergency Use Authorization (EUA). This EUA will remain  in effect (meaning this test can be used) for the duration of the COVID-19 declaration under Section 564(b)(1) of the Act, 21 U.S.C.section 360bbb-3(b)(1), unless the authorization is terminated  or revoked sooner.       Influenza A by PCR NEGATIVE NEGATIVE Final   Influenza B by PCR NEGATIVE NEGATIVE Final    Comment: (NOTE) The Xpert Xpress SARS-CoV-2/FLU/RSV plus assay is intended as an aid in the diagnosis of influenza from Nasopharyngeal swab specimens and should not be used as a sole basis for treatment. Nasal washings and aspirates are unacceptable for Xpert Xpress SARS-CoV-2/FLU/RSV testing.  Fact Sheet for Patients: EntrepreneurPulse.com.au  Fact Sheet for Healthcare Providers: IncredibleEmployment.be  This test is not yet approved or cleared by the [place] FDA and has been authorized for detection and/or diagnosis of SARS-CoV-2 by FDA under an Emergency Use Authorization (EUA). This EUA will remain in effect (meaning this test can be used) for the duration of the COVID-19 declaration under Section 564(b)(1) of the Act, 21 U.S.C. section 360bbb-3(b)(1), unless the authorization is terminated or revoked.  Performed at [facility], [address] [place], [place] [address]     Labs: CBC: Recent Labs  Lab 06/16/22 1436 06/17/22 0656 06/17/22 1454 06/18/22 0658 06/19/22 0908  WBC 4.5 4.3  --  4.5 4.8  HGB 6.3* 7.5* 7.8* 8.2* 9.2*  HCT 22.7* 25.9* 26.1* 27.1* 30.2*  MCV 70.5* 74.9*  --  70.9* 70.9*  PLT 161 156  --  185 280   Basic Metabolic Panel: Recent Labs  Lab 06/16/22 1436 06/17/22 0602 06/18/22 0658 06/19/22 0908  NA 138 140 140 139  K 3.7 4.2 3.9 4.0  CL 109 113* 110 105  CO2 '23 23 24 26  '$ GLUCOSE 82 99 88 91  BUN '10 9 11 11  '$ CREATININE 0.71 0.73 0.68 0.74   CALCIUM 8.8* 8.6* 9.0 9.2  MG  --   --   --  1.8   Liver Function Tests: Recent Labs  Lab 06/16/22 1436  AST 25  ALT 12  ALKPHOS 34*  BILITOT 0.7  PROT 7.2  ALBUMIN 3.6   CBG: No results for input(s): "GLUCAP" in the last 168 hours.  Discharge time spent: less than 30 minutes.  Signed: Ezekiel Slocumb, DO [facility] 06/19/2022

## 2022-06-19 NOTE — Progress Notes (Addendum)
TOC has hx of providing patient with [facility] information to get established with PCP, TOC has provided this once again. Patient's medications will be sent to medication management clinic. Patient did not qualify for nocturnal O2.  No further discharge needs at this time.   [place], [place]

## 2022-06-24 NOTE — Progress Notes (Unsigned)
   Patient ID: Lauren Hensley, female    DOB: 19-Aug-1966, 56 y.o.   MRN: [id number]  HPI  Ms Bovey is a 56 y/o female with a history of  Echo report from 02/03/21 reviewed and showed an EF of 45-50% along with moderate MR and moderate/severe TR.   Admitted 06/16/22 due to  worsening lower extremity edema over the last couple months, also with progressive generalized weakness and fatigue with dyspnea on exertion and occasional palpitations. Found to be anemia and received blood transfusion. IV iron also given. Initially given IV lasix with transition to oral diuretics. Discharged after 3 days.   She presents today for her initial visit with a chief complaint of   Review of Systems    Physical Exam  Assessment & Plan:  1: Chronic heart failure with mildly reduced ejection fraction- - NYHA class  - BNP 06/16/22 was 208.7  2: HTN- - BP - BMP 06/19/22 reviewed and showed sodium 139, potassium 4.0, creatinine 0.74 and GFR >60  3: Anemia- - saw hematology Rogue Bussing) 06/09/22 - iron infusion given 06/09/22 - hemoglobin 06/19/22 was 9.2

## 2022-06-25 ENCOUNTER — Ambulatory Visit: Payer: 59 | Admitting: Family

## 2022-06-25 ENCOUNTER — Ambulatory Visit: Payer: 59 | Attending: Family | Admitting: Family

## 2022-06-25 ENCOUNTER — Encounter: Payer: Self-pay | Admitting: Family

## 2022-06-25 ENCOUNTER — Inpatient Hospital Stay: Payer: 59

## 2022-06-25 VITALS — BP 144/96 | HR 68 | Temp 97.6°F | Resp 18

## 2022-06-25 VITALS — BP 126/58 | HR 63 | Resp 20 | Ht 66.0 in | Wt 332.1 lb

## 2022-06-25 DIAGNOSIS — R0609 Other forms of dyspnea: Secondary | ICD-10-CM | POA: Diagnosis not present

## 2022-06-25 DIAGNOSIS — I110 Hypertensive heart disease with heart failure: Secondary | ICD-10-CM | POA: Insufficient documentation

## 2022-06-25 DIAGNOSIS — D500 Iron deficiency anemia secondary to blood loss (chronic): Secondary | ICD-10-CM | POA: Diagnosis not present

## 2022-06-25 DIAGNOSIS — I10 Essential (primary) hypertension: Secondary | ICD-10-CM | POA: Diagnosis not present

## 2022-06-25 DIAGNOSIS — D509 Iron deficiency anemia, unspecified: Secondary | ICD-10-CM

## 2022-06-25 DIAGNOSIS — R5383 Other fatigue: Secondary | ICD-10-CM | POA: Diagnosis not present

## 2022-06-25 DIAGNOSIS — I5022 Chronic systolic (congestive) heart failure: Secondary | ICD-10-CM | POA: Diagnosis not present

## 2022-06-25 DIAGNOSIS — I340 Nonrheumatic mitral (valve) insufficiency: Secondary | ICD-10-CM | POA: Insufficient documentation

## 2022-06-25 DIAGNOSIS — N920 Excessive and frequent menstruation with regular cycle: Secondary | ICD-10-CM | POA: Diagnosis not present

## 2022-06-25 DIAGNOSIS — I428 Other cardiomyopathies: Secondary | ICD-10-CM | POA: Insufficient documentation

## 2022-06-25 DIAGNOSIS — R531 Weakness: Secondary | ICD-10-CM | POA: Diagnosis not present

## 2022-06-25 DIAGNOSIS — I071 Rheumatic tricuspid insufficiency: Secondary | ICD-10-CM | POA: Diagnosis not present

## 2022-06-25 MED ORDER — SODIUM CHLORIDE 0.9 % IV SOLN
Freq: Once | INTRAVENOUS | Status: AC
  Filled 2022-06-25: qty 250

## 2022-06-25 MED ORDER — SODIUM CHLORIDE 0.9 % IV SOLN
200.0000 mg | Freq: Once | INTRAVENOUS | Status: AC
  Administered 2022-06-25: 200 mg via INTRAVENOUS
  Filled 2022-06-25: qty 200

## 2022-06-25 NOTE — Patient Instructions (Addendum)
Continue weighing daily and call for an overnight weight gain of 3 pounds or more or a weekly weight gain of more than 5 pounds.   If you have voicemail, please make sure your mailbox is cleaned out so that we may leave a message and please make sure to listen to any voicemails.    Drink between 60-64 ounces of fluid daily.

## 2022-06-25 NOTE — Patient Instructions (Signed)
[facility]  Discharge Instructions: Thank you for choosing [facility] to provide your oncology and hematology care.  If you have a lab appointment with the Cancer Center, please go directly to the Cancer Center and check in at the registration area.  Wear comfortable clothing and clothing appropriate for easy access to any Portacath or PICC line.   We strive to give you quality time with your provider. You may need to reschedule your appointment if you arrive late (15 or more minutes).  Arriving late affects you and other patients whose appointments are after yours.  Also, if you miss three or more appointments without notifying the office, you may be dismissed from the clinic at the provider's discretion.      For prescription refill requests, have your pharmacy contact our office and allow 72 hours for refills to be completed.    Today you received the following chemotherapy and/or immunotherapy agents Venofer.      To help prevent nausea and vomiting after your treatment, we encourage you to take your nausea medication as directed.  BELOW ARE SYMPTOMS THAT SHOULD BE REPORTED IMMEDIATELY: *FEVER GREATER THAN 100.4 F (38 C) OR HIGHER *CHILLS OR SWEATING *NAUSEA AND VOMITING THAT IS NOT CONTROLLED WITH YOUR NAUSEA MEDICATION *UNUSUAL SHORTNESS OF BREATH *UNUSUAL BRUISING OR BLEEDING *URINARY PROBLEMS (pain or burning when urinating, or frequent urination) *BOWEL PROBLEMS (unusual diarrhea, constipation, pain near the anus) TENDERNESS IN MOUTH AND THROAT WITH OR WITHOUT PRESENCE OF ULCERS (sore throat, sores in mouth, or a toothache) UNUSUAL RASH, SWELLING OR PAIN  UNUSUAL VAGINAL DISCHARGE OR ITCHING   Items with * indicate a potential emergency and should be followed up as soon as possible or go to the Emergency Department if any problems should occur.  Please show the CHEMOTHERAPY ALERT CARD or IMMUNOTHERAPY ALERT CARD at check-in to  the Emergency Department and triage nurse.  Should you have questions after your visit or need to cancel or reschedule your appointment, please contact [facility]  [phone] and follow the prompts.  Office hours are 8:00 a.m. to 4:30 p.m. Monday - Friday. Please note that voicemails left after 4:00 p.m. may not be returned until the following business day.  We are closed weekends and major holidays. You have access to a nurse at all times for urgent questions. Please call the main number to the clinic [phone] and follow the prompts.  For any non-urgent questions, you may also contact your provider using MyChart. We now offer e-Visits for anyone 18 and older to request care online for non-urgent symptoms. For details visit [email or web address].   Also download the MyChart app! Go to the app store, search "MyChart", open the app, select [place] [facility], and log in with your MyChart username and password.  Masks are optional in the cancer centers. If you would like for your care team to wear a mask while they are taking care of you, please let them know. For doctor visits, patients may have with them one support person who is at least 54 years old. At this time, visitors are not allowed in the infusion area.   

## 2022-06-30 MED FILL — Iron Sucrose Inj 20 MG/ML (Fe Equiv): INTRAVENOUS | Qty: 10 | Status: AC

## 2022-07-01 ENCOUNTER — Inpatient Hospital Stay: Payer: 59 | Attending: Nurse Practitioner

## 2022-07-01 ENCOUNTER — Inpatient Hospital Stay: Payer: 59

## 2022-07-01 DIAGNOSIS — N920 Excessive and frequent menstruation with regular cycle: Secondary | ICD-10-CM | POA: Insufficient documentation

## 2022-07-01 DIAGNOSIS — D500 Iron deficiency anemia secondary to blood loss (chronic): Secondary | ICD-10-CM | POA: Insufficient documentation

## 2022-07-01 NOTE — Progress Notes (Addendum)
Unable to obtain vascular access with multiple attempts made by several nurses. Pt states she will reschedule appt for later date. MD made aware.

## 2022-07-03 ENCOUNTER — Inpatient Hospital Stay: Payer: 59

## 2022-07-03 VITALS — BP 115/70 | HR 60 | Temp 97.4°F

## 2022-07-03 DIAGNOSIS — N920 Excessive and frequent menstruation with regular cycle: Secondary | ICD-10-CM | POA: Diagnosis not present

## 2022-07-03 DIAGNOSIS — D500 Iron deficiency anemia secondary to blood loss (chronic): Secondary | ICD-10-CM | POA: Diagnosis not present

## 2022-07-03 MED ORDER — SODIUM CHLORIDE 0.9 % IV SOLN
Freq: Once | INTRAVENOUS | Status: AC
  Filled 2022-07-03: qty 250

## 2022-07-03 MED ORDER — SODIUM CHLORIDE 0.9 % IV SOLN
200.0000 mg | Freq: Once | INTRAVENOUS | Status: AC
  Administered 2022-07-03: 200 mg via INTRAVENOUS
  Filled 2022-07-03: qty 200

## 2022-07-16 ENCOUNTER — Ambulatory Visit
Admission: RE | Admit: 2022-07-16 | Discharge: 2022-07-16 | Disposition: A | Discharge status: Home / Self Care | Payer: 59 | Source: Ambulatory Visit | Attending: Family | Admitting: Family

## 2022-07-16 ENCOUNTER — Telehealth (HOSPITAL_COMMUNITY): Payer: Self-pay | Admitting: Licensed Clinical Social Worker

## 2022-07-16 ENCOUNTER — Other Ambulatory Visit: Payer: Self-pay

## 2022-07-16 ENCOUNTER — Ambulatory Visit (HOSPITAL_BASED_OUTPATIENT_CLINIC_OR_DEPARTMENT_OTHER): Payer: 59 | Admitting: Family

## 2022-07-16 ENCOUNTER — Encounter: Payer: Self-pay | Admitting: Family

## 2022-07-16 VITALS — BP 144/53 | HR 64 | Resp 16 | Ht 69.0 in | Wt 331.2 lb

## 2022-07-16 DIAGNOSIS — D509 Iron deficiency anemia, unspecified: Secondary | ICD-10-CM

## 2022-07-16 DIAGNOSIS — I5022 Chronic systolic (congestive) heart failure: Secondary | ICD-10-CM

## 2022-07-16 DIAGNOSIS — R531 Weakness: Secondary | ICD-10-CM | POA: Diagnosis not present

## 2022-07-16 DIAGNOSIS — I110 Hypertensive heart disease with heart failure: Secondary | ICD-10-CM | POA: Insufficient documentation

## 2022-07-16 DIAGNOSIS — R0602 Shortness of breath: Secondary | ICD-10-CM | POA: Diagnosis not present

## 2022-07-16 DIAGNOSIS — R002 Palpitations: Secondary | ICD-10-CM | POA: Insufficient documentation

## 2022-07-16 DIAGNOSIS — R519 Headache, unspecified: Secondary | ICD-10-CM | POA: Insufficient documentation

## 2022-07-16 DIAGNOSIS — I509 Heart failure, unspecified: Secondary | ICD-10-CM | POA: Insufficient documentation

## 2022-07-16 DIAGNOSIS — I10 Essential (primary) hypertension: Secondary | ICD-10-CM

## 2022-07-16 DIAGNOSIS — R0789 Other chest pain: Secondary | ICD-10-CM | POA: Insufficient documentation

## 2022-07-16 LAB — ECHOCARDIOGRAM COMPLETE
AR max vel: 3.44 cm2
AV Area VTI: 3.31 cm2
AV Area mean vel: 3.5 cm2
AV Mean grad: 2 mmHg
AV Peak grad: 3.9 mmHg
Ao pk vel: 0.98 m/s
Area-P 1/2: 4.63 cm2
S' Lateral: 3.4 cm

## 2022-07-16 NOTE — Progress Notes (Signed)
Patient ID: Lauren Hensley, female    DOB: Apr 11, 1966, 56 y.o.   MRN: [id number]  HPI  Lauren Hensley is a 56 y/o female with a history of HTN, anemia and chronic heart failure.   Echo report from 02/03/21 reviewed and showed an EF of 45-50% along with moderate MR and moderate/severe TR.   Admitted 06/16/22 due to  worsening lower extremity edema over the last couple months, also with progressive generalized weakness and fatigue with dyspnea on exertion and occasional palpitations. Found to be anemia and received blood transfusion. IV iron also given. Initially given IV lasix with transition to oral diuretics. Discharged after 3 days.   She presents today for a follow-up visit with a chief complaint of minimal shortness of breath with moderate exertion. Describes this as chronic in nature although is "much better". She has associated fatigue, intermittent chest pain, pedal edema (improving), palpitations, headaches (she thinks due to stress) and weakness along with this. She denies any difficulty sleeping, abdominal distention, wheezing, cough or weight gain.   She is currently living with her brother and his landlord found out and has threatened to raise the rent so she may have to move out. If she does, she doesn't know where she would go as she doesn't have any income coming in. 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   Admits to not knowing or sometimes understanding how to navigate social services. Her current insurance a[employer]na does not have any transportation benefit.   Had her echo done earlier today.   Past Medical History:  Diagnosis Date   Anemia    CHF (congestive heart failure) ([place])    Headache 06/17/2022   Hypertension    Past Surgical History:  Procedure Laterality Date   COLONOSCOPY WITH PROPOFOL N/A 02/08/2021   Procedure: COLONOSCOPY WITH PROPOFOL;  Surgeon: Lin Landsman, MD;  Location: [facility] ENDOSCOPY;  Service: Gastroenterology;  Laterality: N/A;    EMBOLIZATION N/A 07/29/2021   Procedure: EMBOLIZATION;  Surgeon: Katha Cabal, MD;  Location: [place] CV LAB;  Service: Cardiovascular;  Laterality: N/A;   EMBOLIZATION N/A 09/02/2021   Procedure: EMBOLIZATION;  Surgeon: Katha Cabal, MD;  Location: [place] CV LAB;  Service: Cardiovascular;  Laterality: N/A;   Emobolization  09/02/2021   ESOPHAGOGASTRODUODENOSCOPY N/A 02/08/2021   Procedure: ESOPHAGOGASTRODUODENOSCOPY (EGD);  Surgeon: Lin Landsman, MD;  Location: [facility] ENDOSCOPY;  Service: Gastroenterology;  Laterality: N/A;   GIVENS CAPSULE STUDY N/A 02/09/2021   Procedure: GIVENS CAPSULE STUDY;  Surgeon: Lesly Rubenstein, MD;  Location: [facility] ENDOSCOPY;  Service: Endoscopy;  Laterality: N/A;   Family History  Problem Relation Age of Onset   Diabetes Mother    COPD Mother    Heart Problems Mother    Emphysema Father    Hypercholesterolemia Brother    Diabetes Maternal Aunt    Obesity Maternal Aunt    Arthritis Maternal Aunt    Social History   Tobacco Use   Smoking status: Never   Smokeless tobacco: Never  Substance Use Topics   Alcohol use: Not Currently    Comment: 6-7 months ago   No Known Allergies  Prior to Admission medications   Medication Sig Start Date End Date Taking? Authorizing Provider  furosemide (LASIX) 20 MG tablet Take 1 tablet (20 mg total) by mouth daily. 06/19/22 09/17/22 Yes Nicole Kindred A, DO  potassium chloride SA (KLOR-CON M) 20 MEQ tablet Take 1 tablet (20 mEq total) by mouth daily. 06/19/22  Yes Nicole Kindred A, DO  medroxyPROGESTERone (DEPO-PROVERA) 150 MG/ML injection Inject 1 mL (150 mg total) into the muscle every 3 (three) months. Patient not taking: Reported on 04/30/2021 03/05/21 04/30/21  Homero Fellers, MD   Review of Systems  Constitutional:  Positive for fatigue. Negative for appetite change.  HENT:  Negative for congestion, postnasal drip and sore throat.   Eyes: Negative.   Respiratory:  Positive  for shortness of breath ("much better"). Negative for cough and wheezing.   Cardiovascular:  Positive for chest pain (rarely), palpitations (at times) and leg swelling ("better").  Gastrointestinal:  Negative for abdominal distention and abdominal pain.  Endocrine: Negative.   Genitourinary: Negative.   Musculoskeletal:  Negative for back pain and neck pain.  Skin: Negative.   Allergic/Immunologic: Negative.   Neurological:  Positive for weakness (legs) and headaches (every few days). Negative for dizziness and light-headedness.  Hematological:  Negative for adenopathy. Does not bruise/bleed easily.  Psychiatric/Behavioral:  Negative for dysphoric mood and sleep disturbance (sleep on 1 pillow). The patient is not nervous/anxious.    Vitals:   07/16/22 1151  BP: (!) 144/53  Pulse: 64  Resp: 16  SpO2: 99%  Weight: (!) 331 lb 4 oz (150.3 kg)  Height: '5\' 9"'$  (1.753 m)   Wt Readings from Last 3 Encounters:  07/16/22 (!) 331 lb 4 oz (150.3 kg)  06/25/22 (!) 332 lb 2 oz (150.7 kg)  06/19/22 (!) 326 lb 15.1 oz (148.3 kg)   Lab Results  Component Value Date   CREATININE 0.74 06/19/2022   CREATININE 0.68 06/18/2022   CREATININE 0.73 06/17/2022   Physical Exam Vitals and nursing note reviewed.  Constitutional:      Appearance: She is well-developed.  HENT:     Head: Normocephalic and atraumatic.  Cardiovascular:     Rate and Rhythm: Normal rate and regular rhythm.  Pulmonary:     Effort: Pulmonary effort is normal.     Breath sounds: No wheezing, rhonchi or rales.  Abdominal:     Palpations: Abdomen is soft.     Tenderness: There is no abdominal tenderness.  Musculoskeletal:     Cervical back: Normal range of motion.     Right lower leg: No tenderness. Edema (trace pitting) present.     Left lower leg: No tenderness. Edema (trace pitting) present.  Skin:    General: Skin is warm and dry.  Neurological:     General: No focal deficit present.     Mental Status: She is alert  and oriented to person, place, and time.  Psychiatric:        Mood and Affect: Mood normal.        Behavior: Behavior normal.   Assessment & Plan:  1: Chronic heart failure with mildly reduced ejection fraction- - NYHA class II - euvolemic today - weighing daily; reminded to call for an overnight weight gain of > 2 pounds or a weekly weight gain of > 5 pounds - weight down 1 pound from last visit here 3 weeks ago - not adding salt and using Mrs Deliah Boston for seasoning - drinking 64 ounces water and 8 ounces of tea daily; explained to keep daily fluid intake to 60-64 ounces - had echo done earlier today - discussed GDMT pending echo results; will call patient with results - discussed contacting the social worker at the [facility] and she says that she would like [place] to do that; contacted Tammy Sours and she will reach out to the  patient - BNP 06/16/22 was 208.7  2: HTN- - BP mildly elevated (144/53) - is trying to find a PCP that is close to where she lives and that takes aetna - BMP 06/19/22 reviewed and showed sodium 139, potassium 4.0, creatinine 0.74 and GFR >60  3: Anemia, iron deficient- - saw hematology Rogue Bussing) 06/09/22 - iron infusion given 07/03/22 - hemoglobin 06/19/22 was 9.2   Medication bottles reviewed.   Return in 3 months, sooner if we start any GDMT

## 2022-07-16 NOTE — Telephone Encounter (Signed)
CSW consulted to speak with pt regarding housing concerns.  Called pt to discuss-unable to reach- left message requesting return call  Lauren Hensley, [facility] Desk#: [phone] Cell#: [phone]

## 2022-07-16 NOTE — Progress Notes (Signed)
*  PRELIMINARY RESULTS* Echocardiogram 2D Echocardiogram has been performed.  Lauren Hensley 07/16/2022, 11:26 AM

## 2022-07-16 NOTE — Patient Instructions (Addendum)
Continue weighing daily and call for an overnight weight gain of 3 pounds or more or a weekly weight gain of more than 5 pounds.   If you have voicemail, please make sure your mailbox is cleaned out so that we may leave a message and please make sure to listen to any voicemails.    I will call you once we get your test result back

## 2022-07-20 NOTE — Progress Notes (Signed)
Heart and Vascular Care Navigation  07/20/2022  Lauren Hensley [birth year] [id number]  Reason for Referral: housing concerns   Engaged with patient by telephone for initial visit for Heart and Vascular Care Coordination.                                                                                                   Assessment:      CSW received return call from pt to discuss above concerns.  Pt reports she has long history of homelessness and has not had stable housing for at least 16 years.  Most of that time she [place] and [place] but since becoming sober 2 years ago she started staying with her brother intermittently.  Was staying with him mainly on weekends then eventually transferred to staying with him full time (pt niece and pt son also live in this apartment).  Pt reports that the apartment complex found out about her staying at the apartment and is now stating they will increase the rent if she continues to stay there and she states that her family cannot afford this increase so she plans to leave.    Patient has no source of income at this time and has not worked since becoming homeless 16 years ago.  Has no applied for disability but would like to- CSW discussed different options to apply- pt will follow up.  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  CSW did mention that Medicaid might switch to full medicaid in December with new Medicaid expansion.  CSW provided pt with number to local shelter and coordinated reentry list to help get on list for case management services for those who are homeless.  Unfortunately no other options for patient as she has no income at this time.                               HRT/VAS Care Coordination     Patients Home Cardiology Office Heart Failure Clinic   Living arrangements for the past 2 months No permanent address   Lives with: Siblings; Adult Children   Patient Current Art gallery manager   Patient Has Concern With Paying Medical Bills No   Does Patient Have Prescription Coverage? Yes   Home Assistive Devices/Equipment None       Social History:                                                                             SDOH Screenings   Food Insecurity: No Food Insecurity (07/16/2022)  Housing: High Risk (07/20/2022)  Transportation Needs: Unmet Transportation Needs (07/16/2022)  Utilities: Not At Risk (06/17/2022)  Depression (PHQ2-9): Low Risk  (07/16/2022)  Financial Resource Strain: High Risk (  07/16/2022)  Tobacco Use: Low Risk  (07/16/2022)    SDOH Interventions: Financial Resources:    Fish farm manager for Disability application assistance  Food Insecurity:   Receives SNAP benefits  Housing Insecurity:  Housing Interventions: Other (Comment) (coordinated reentry)  Transportation:   No access to car   Follow-up plan:    CSW will continue to be available to pt to assist as able- unfortunately given pt many barriers best option at this time is to get set up with local shelter options and case management to work towards stable permanent housing.  Jorge Ny, LCSW Clinical Social Worker [facility] Desk#: [phone] Cell#: [phone]

## 2022-07-21 ENCOUNTER — Ambulatory Visit: Payer: 59 | Admitting: Gerontology

## 2022-07-27 ENCOUNTER — Encounter (INDEPENDENT_AMBULATORY_CARE_PROVIDER_SITE_OTHER): Payer: Self-pay

## 2022-07-28 ENCOUNTER — Ambulatory Visit: Payer: 59 | Admitting: Family

## 2022-08-17 ENCOUNTER — Other Ambulatory Visit: Payer: Self-pay

## 2022-09-28 ENCOUNTER — Encounter: Payer: Self-pay | Admitting: Internal Medicine

## 2022-10-02 MED FILL — Iron Sucrose Inj 20 MG/ML (Fe Equiv): INTRAVENOUS | Qty: 10 | Status: AC

## 2022-10-04 NOTE — Progress Notes (Deleted)
Patient ID: Lauren Hensley, female    DOB: March 04, 1966, 57 y.o.   MRN: [id number]  HPI  Lauren Hensley is a 57 y/o female with a history of HTN, anemia and chronic heart failure.   Echo on 07/16/22 showed EF of 55-60% along with mild LVH and moderate TR. Echo report from 02/03/21 reviewed and showed an EF of 45-50% along with moderate MR and moderate/severe TR.   Admitted 06/16/22 due to  worsening lower extremity edema over the last couple months, also with progressive generalized weakness and fatigue with dyspnea on exertion and occasional palpitations. Found to be anemia and received blood transfusion. IV iron also given. Initially given IV lasix with transition to oral diuretics. Discharged after 3 days.   She presents today for a follow-up visit with a chief complaint of  She is currently living with her brother and his landlord found out and has threatened to raise the rent so she may have to move out. If she does, she doesn't know where she would go as she doesn't have any income coming in. 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     Past Medical History:  Diagnosis Date   Anemia    CHF (congestive heart failure) ([place])    Headache 06/17/2022   Hypertension    Past Surgical History:  Procedure Laterality Date   COLONOSCOPY WITH PROPOFOL N/A 02/08/2021   Procedure: COLONOSCOPY WITH PROPOFOL;  Surgeon: Lin Landsman, MD;  Location: [facility] ENDOSCOPY;  Service: Gastroenterology;  Laterality: N/A;   EMBOLIZATION N/A 07/29/2021   Procedure: EMBOLIZATION;  Surgeon: Katha Cabal, MD;  Location: [place] CV LAB;  Service: Cardiovascular;  Laterality: N/A;   EMBOLIZATION N/A 09/02/2021   Procedure: EMBOLIZATION;  Surgeon: Katha Cabal, MD;  Location: [place] CV LAB;  Service: Cardiovascular;  Laterality: N/A;   Emobolization  09/02/2021   ESOPHAGOGASTRODUODENOSCOPY N/A 02/08/2021   Procedure: ESOPHAGOGASTRODUODENOSCOPY (EGD);  Surgeon: Lin Landsman, MD;  Location: [facility] ENDOSCOPY;  Service: Gastroenterology;  Laterality: N/A;   GIVENS CAPSULE STUDY N/A 02/09/2021   Procedure: GIVENS CAPSULE STUDY;  Surgeon: Lesly Rubenstein, MD;  Location: [facility] ENDOSCOPY;  Service: Endoscopy;  Laterality: N/A;   Family History  Problem Relation Age of Onset   Diabetes Mother    COPD Mother    Heart Problems Mother    Emphysema Father    Hypercholesterolemia Brother    Diabetes Maternal Aunt    Obesity Maternal Aunt    Arthritis Maternal Aunt    Social History   Tobacco Use   Smoking status: Never   Smokeless tobacco: Never  Substance Use Topics   Alcohol use: Not Currently    Comment: 6-7 months ago   No Known Allergies   Review of Systems  Constitutional:  Positive for fatigue. Negative for appetite change.  HENT:  Negative for congestion, postnasal drip and sore throat.   Eyes: Negative.   Respiratory:  Positive for shortness of breath ("much better"). Negative for cough and wheezing.   Cardiovascular:  Positive for chest pain (rarely), palpitations (at times) and leg swelling ("better").  Gastrointestinal:  Negative for abdominal distention and abdominal pain.  Endocrine: Negative.   Genitourinary: Negative.   Musculoskeletal:  Negative for back pain and neck pain.  Skin: Negative.   Allergic/Immunologic: Negative.   Neurological:  Positive for weakness (legs) and headaches (every few days). Negative for dizziness and light-headedness.  Hematological:  Negative for adenopathy. Does not bruise/bleed easily.  Psychiatric/Behavioral:  Negative for dysphoric mood and sleep disturbance (sleep on 1 pillow). The patient is not nervous/anxious.      Physical Exam Vitals and nursing note reviewed.  Constitutional:      Appearance: She is well-developed.  HENT:     Head: Normocephalic and atraumatic.  Cardiovascular:     Rate and Rhythm: Normal rate and regular rhythm.  Pulmonary:     Effort: Pulmonary effort is normal.      Breath sounds: No wheezing, rhonchi or rales.  Abdominal:     Palpations: Abdomen is soft.     Tenderness: There is no abdominal tenderness.  Musculoskeletal:     Cervical back: Normal range of motion.     Right lower leg: No tenderness. Edema (trace pitting) present.     Left lower leg: No tenderness. Edema (trace pitting) present.  Skin:    General: Skin is warm and dry.  Neurological:     General: No focal deficit present.     Mental Status: She is alert and oriented to person, place, and time.  Psychiatric:        Mood and Affect: Mood normal.        Behavior: Behavior normal.   Assessment & Plan:  1: Chronic heart failure with preserved ejection fraction with LVH- - NYHA class II - euvolemic today - weighing daily; reminded to call for an overnight weight gain of > 2 pounds or a weekly weight gain of > 5 pounds - weight 331.4 pound from last visit here 3 months ago - not adding salt and using Mrs Deliah Boston for seasoning - GDMT of - drinking 64 ounces water and 8 ounces of tea daily; explained to keep daily fluid intake to 60-64 ounces - BNP 06/16/22 was 208.7  2: HTN- - BP  - is trying to find a PCP that is close to where she lives and that takes aetna - BMP 06/19/22 reviewed and showed sodium 139, potassium 4.0, creatinine 0.74 and GFR >60  3: Anemia, iron deficient- - saw hematology Rogue Bussing) 06/09/22 - iron infusion given 07/03/22 - hemoglobin 06/19/22 was 9.2   Medication bottles reviewed.

## 2022-10-05 ENCOUNTER — Inpatient Hospital Stay: Payer: 59 | Attending: Nurse Practitioner

## 2022-10-05 ENCOUNTER — Ambulatory Visit: Payer: 59 | Admitting: Family

## 2022-10-05 ENCOUNTER — Encounter: Payer: Self-pay | Admitting: Internal Medicine

## 2022-10-05 ENCOUNTER — Inpatient Hospital Stay: Payer: 59

## 2022-10-05 ENCOUNTER — Inpatient Hospital Stay (HOSPITAL_BASED_OUTPATIENT_CLINIC_OR_DEPARTMENT_OTHER): Payer: 59 | Admitting: Internal Medicine

## 2022-10-05 VITALS — BP 161/100 | HR 62 | Temp 96.7°F | Wt 340.6 lb

## 2022-10-05 DIAGNOSIS — D500 Iron deficiency anemia secondary to blood loss (chronic): Secondary | ICD-10-CM

## 2022-10-05 DIAGNOSIS — N920 Excessive and frequent menstruation with regular cycle: Secondary | ICD-10-CM | POA: Diagnosis not present

## 2022-10-05 LAB — IRON AND TIBC
Iron: 43 ug/dL (ref 28–170)
Saturation Ratios: 11 % (ref 10.4–31.8)
TIBC: 386 ug/dL (ref 250–450)
UIBC: 343 ug/dL

## 2022-10-05 LAB — CBC WITH DIFFERENTIAL/PLATELET
Abs Immature Granulocytes: 0.01 10*3/uL (ref 0.00–0.07)
Basophils Absolute: 0 10*3/uL (ref 0.0–0.1)
Basophils Relative: 1 %
Eosinophils Absolute: 0.1 10*3/uL (ref 0.0–0.5)
Eosinophils Relative: 1 %
HCT: 34.7 % — ABNORMAL LOW (ref 36.0–46.0)
Hemoglobin: 10.9 g/dL — ABNORMAL LOW (ref 12.0–15.0)
Immature Granulocytes: 0 %
Lymphocytes Relative: 34 %
Lymphs Abs: 1.9 10*3/uL (ref 0.7–4.0)
MCH: 22.2 pg — ABNORMAL LOW (ref 26.0–34.0)
MCHC: 31.4 g/dL (ref 30.0–36.0)
MCV: 70.7 fL — ABNORMAL LOW (ref 80.0–100.0)
Monocytes Absolute: 0.5 10*3/uL (ref 0.1–1.0)
Monocytes Relative: 9 %
Neutro Abs: 3.1 10*3/uL (ref 1.7–7.7)
Neutrophils Relative %: 55 %
Platelets: 196 10*3/uL (ref 150–400)
RBC: 4.91 MIL/uL (ref 3.87–5.11)
RDW: 15.5 % (ref 11.5–15.5)
Smear Review: NORMAL
WBC: 5.6 10*3/uL (ref 4.0–10.5)
nRBC: 0 % (ref 0.0–0.2)

## 2022-10-05 LAB — BASIC METABOLIC PANEL
Anion gap: 7 (ref 5–15)
BUN: 9 mg/dL (ref 6–20)
CO2: 25 mmol/L (ref 22–32)
Calcium: 8.8 mg/dL — ABNORMAL LOW (ref 8.9–10.3)
Chloride: 103 mmol/L (ref 98–111)
Creatinine, Ser: 0.88 mg/dL (ref 0.44–1.00)
GFR, Estimated: >60 mL/min (ref >60)
Glucose, Bld: 87 mg/dL (ref 70–99)
Potassium: 3.5 mmol/L (ref 3.5–5.1)
Sodium: 135 mmol/L (ref 135–145)

## 2022-10-05 LAB — FERRITIN: Ferritin: 8 ng/mL — ABNORMAL LOW (ref 11–307)

## 2022-10-05 NOTE — Assessment & Plan Note (Addendum)
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 S/p IV Venofer.    #  JAN 2024- iron sat- 11 &%; feritin-8. Today hemoglobin is 10.9 MCV is low.  Proceed with venofer today.  Again recommend compliance with oral pills; especially given poor IV access.   # Menorrhagia-fibroids s/p gynecology evaluation. S/p partial embolization left & right uterine artery  [Dr.Dew]-DC 2022]- stable.    # IV access: POOR discussed continue hydration.  We will have to consider either port placement if patient needs ongoing labs/infusions.  Patient understands need for hydration prior to IV access.  needs transportation # DISPOSITION # venofer today  # follow up in 4 months- MD; labs- cbc;bmp; iron studies;ferritin; possible- venofer- Dr.B

## 2022-10-05 NOTE — Progress Notes (Signed)
Patient denies new problems/concerns today.    BP 158/106 (1st check), 161/100 (2nd check).

## 2022-10-05 NOTE — Progress Notes (Signed)
3 nurses and 5 attempts with no success on obtaining PIV access. MD and MD team aware, pt to be rescheduled for Venofer infusion.

## 2022-10-07 IMAGING — US US ABDOMEN LIMITED RUQ/ASCITES
1 series · 14 of 25 positions shown · non-contrast
Comparison: None.

CLINICAL DATA: Cirrhosis.

EXAM:
ULTRASOUND ABDOMEN LIMITED RIGHT UPPER QUADRANT

[Series 1: us abdomen limited ruq (liver/gb) · 14 of 38 slices shown]
[im 1/38]
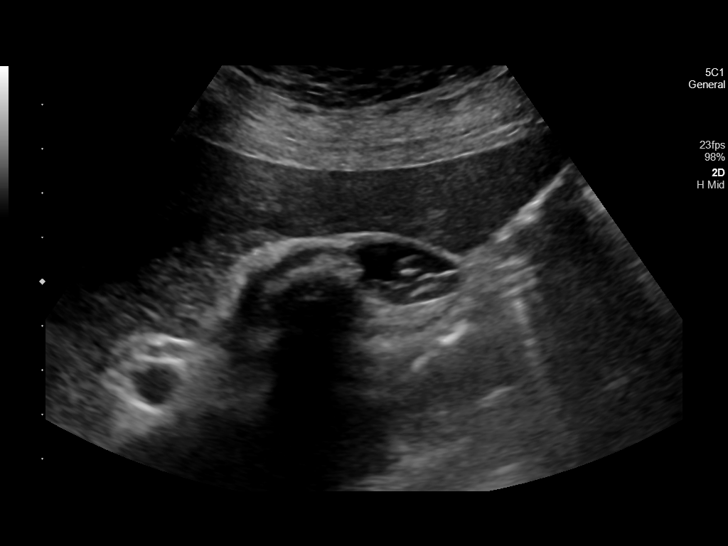
[im 4/38]
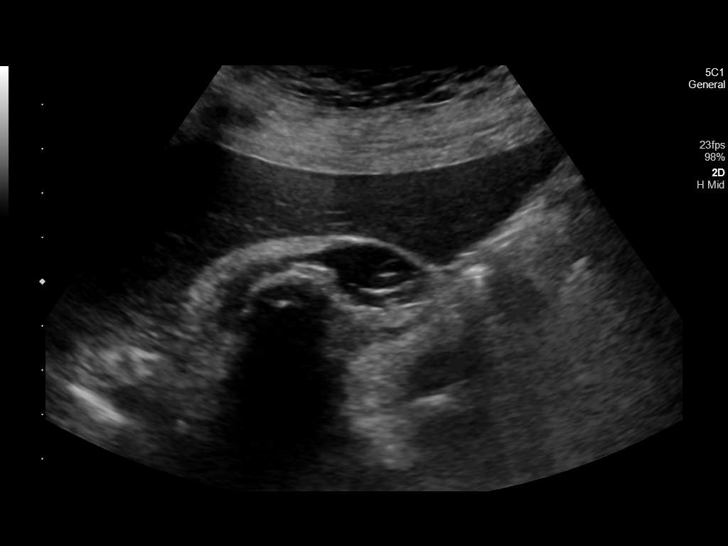
[im 7/38]
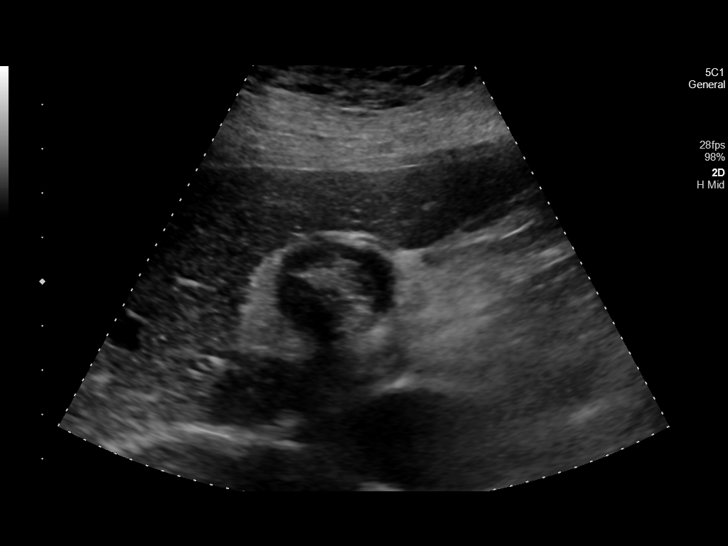
[im 10/38]
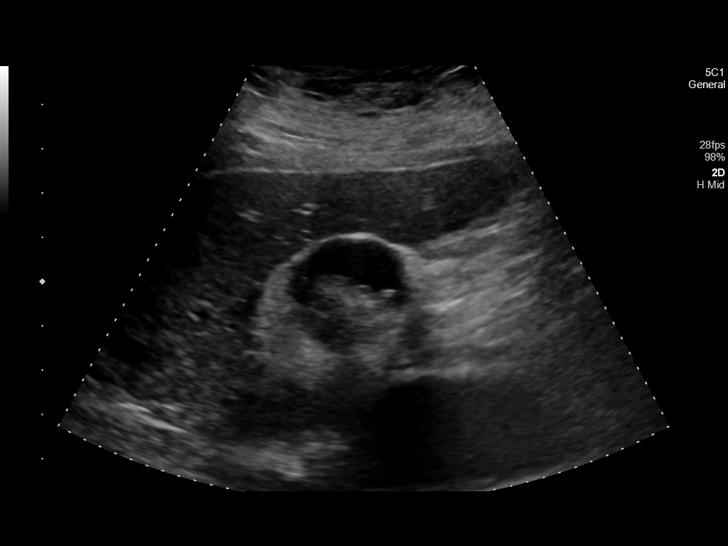
[im 13/38]
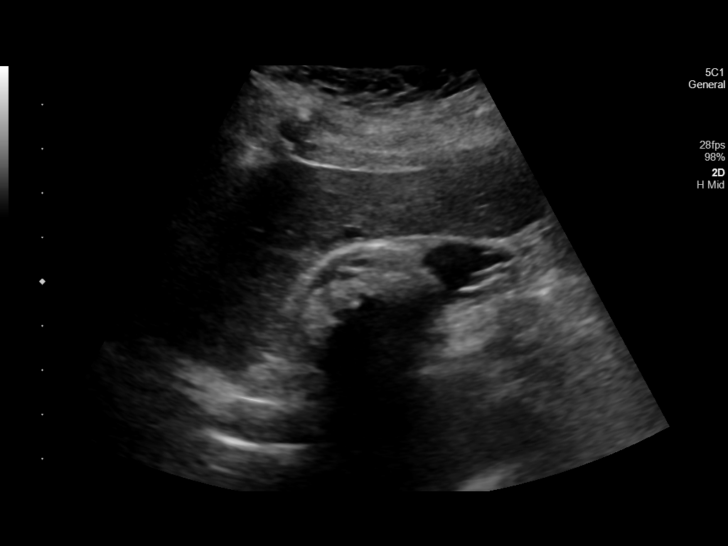
[im 14/38]
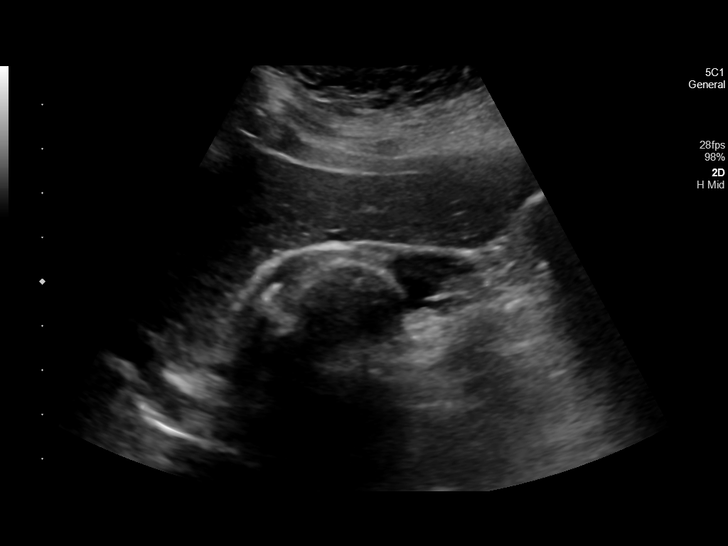
[im 17/38]
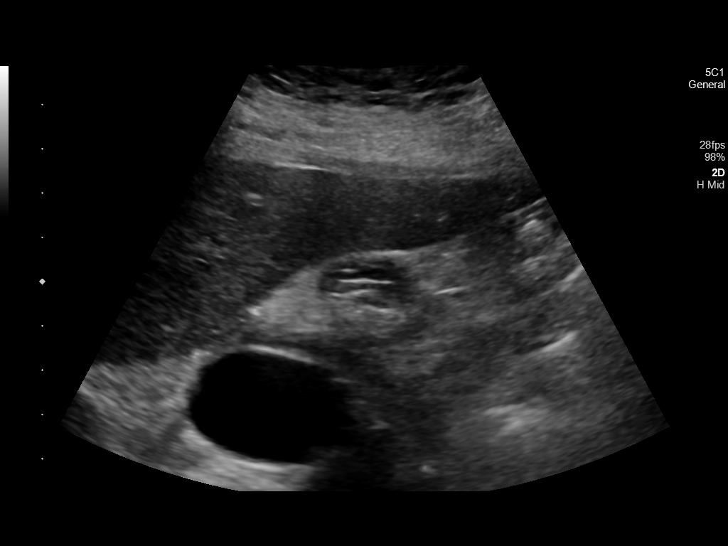
[im 21/38]
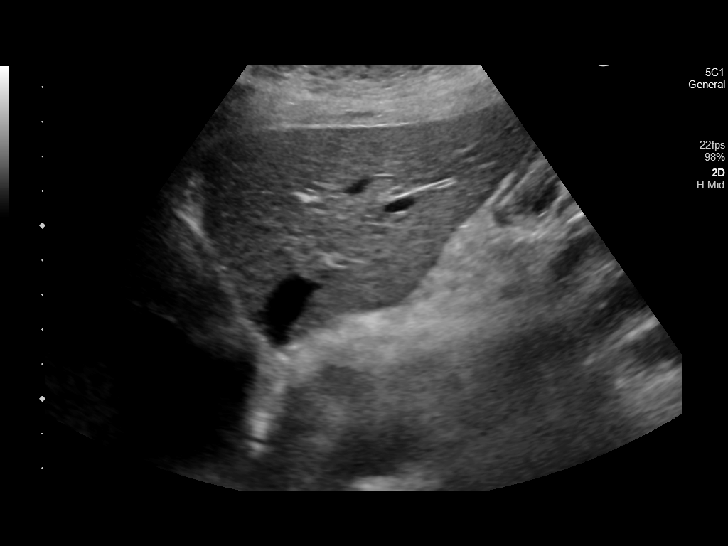
[im 24/38]
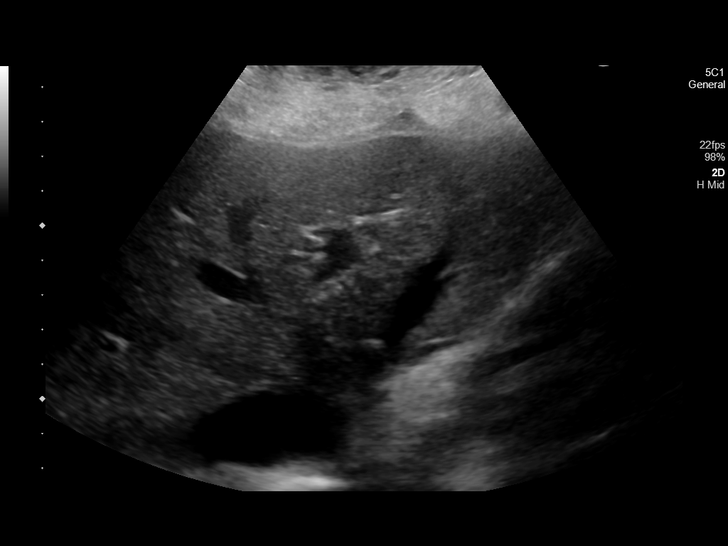
[im 25/38]
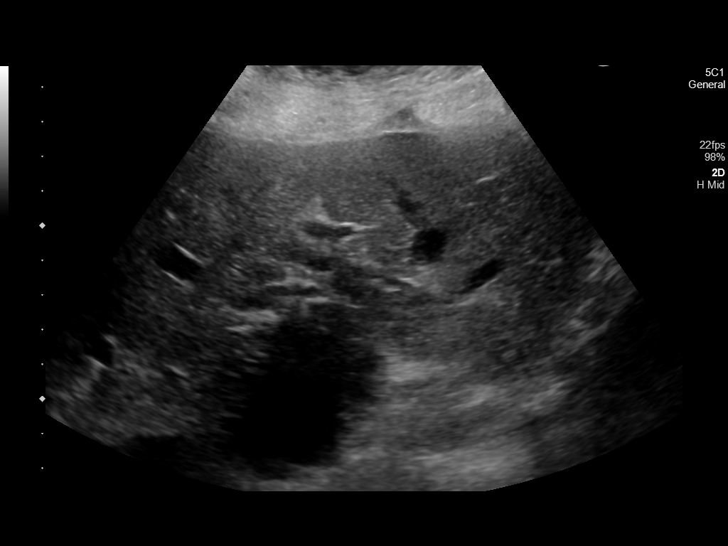
[im 28/38]
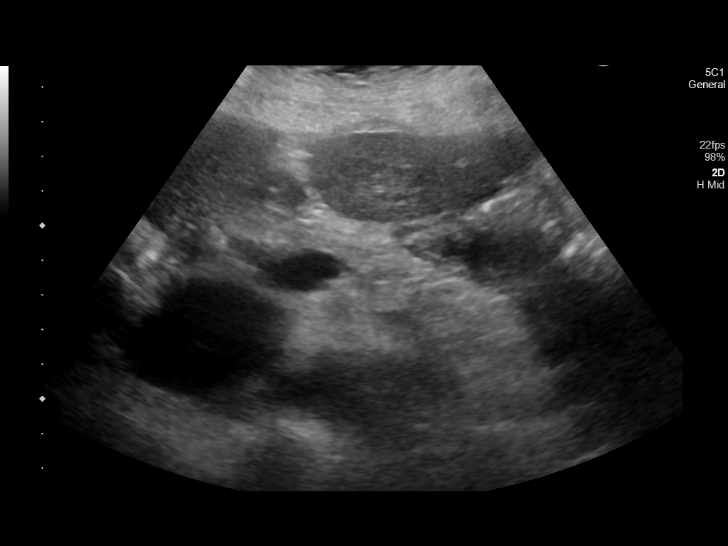
[im 31/38]
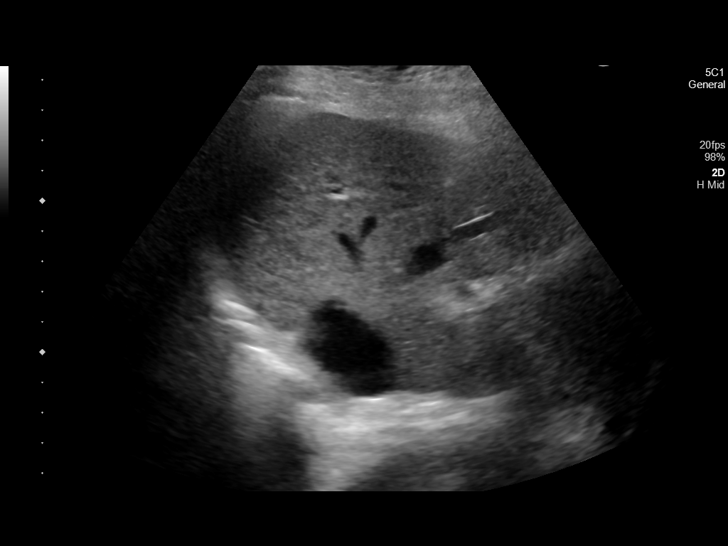
[im 34/38]
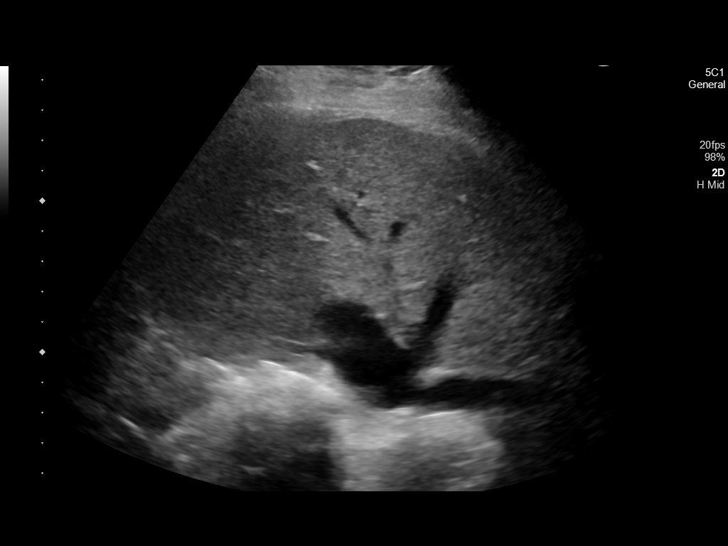
[im 38/38]
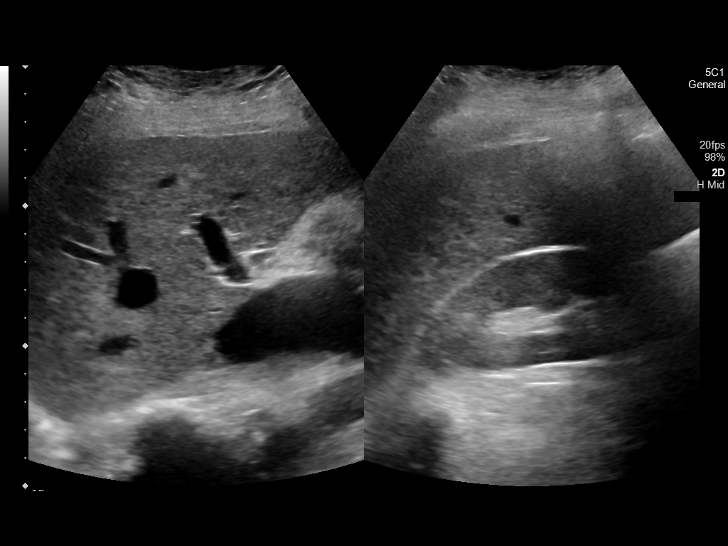

[14 of 25 positions shown; findings below may reference images not displayed]

FINDINGS: Gallbladder:

Multiple stones, largest 2.4 cm. Probable intervening sludge. Mild
area wall prominence up to 4 mm, remainder of the fall normal in
thickness. No pericholecystic fluid. No sonographic Murphy's sign.

Common bile duct:

Diameter: 3 mm

Liver:

Normal size. Coarsened echotexture with mild generalized increased
parenchymal echogenicity. No masses. Portal vein is patent on color
Doppler imaging with normal direction of blood flow towards the
liver.

Other: None.
IMPRESSION: 1. No acute findings.
2. Coarsened liver echotexture with mild generalized increased liver
parenchymal echogenicity. Findings consistent with the history of
cirrhosis likely with a component of hepatic steatosis. No liver
masses.
3. Multiple gallstones.  No acute cholecystitis.

## 2022-10-07 MED FILL — Iron Sucrose Inj 20 MG/ML (Fe Equiv): INTRAVENOUS | Qty: 10 | Status: AC

## 2022-10-08 ENCOUNTER — Inpatient Hospital Stay: Payer: 59

## 2022-10-09 ENCOUNTER — Telehealth: Payer: Self-pay

## 2022-10-09 NOTE — Telephone Encounter (Signed)
Left vm and sent mychart message to confirm 10/15/22 appointment-Toni

## 2022-10-12 ENCOUNTER — Encounter: Payer: Self-pay | Admitting: Internal Medicine

## 2022-10-12 NOTE — Progress Notes (Signed)
[place] OFFICE PROGRESS NOTE  Patient Care Team: Homero Fellers, MD as PCP - General (Obstetrics and Gynecology) Rico Junker, RN as Registered Nurse Theodore Demark, RN (Inactive) as Registered Nurse Cammie Sickle, MD as Consulting Physician (Oncology)   Cancer Staging  No matching staging information was found for the patient.   Oncology History   No history exists.   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   #Heavy menstrual cycles-fibroids [Dr Shuman]; DEC 2022- s/p uterine embolization.    INTERVAL HISTORY: Ambulating independently.  Alone.  Lauren Hensley 57 y.o.  female pleasant patient history of iron deficient anemia/beta thalassemia-other history of fibroids/heavy menstrual cycle is here for follow-up.   Patient denies new problems/concerns today.   BP 158/106 (1st check), 161/100 (2nd check).   Patient complains of mild to moderate fatigue. Patient continues to have mild intermittent menstrual cycles post embolization.  However not heavy.  Patient not taking oral iron; noncompliant.  Review of Systems  Constitutional:  Positive for malaise/fatigue. Negative for chills, diaphoresis, fever and weight loss.  HENT:  Negative for nosebleeds and sore throat.   Eyes:  Negative for double vision.  Respiratory:  Negative for cough, hemoptysis, sputum production, shortness of breath and wheezing.   Cardiovascular:  Negative for chest pain, palpitations, orthopnea and leg swelling.  Gastrointestinal:  Negative for abdominal pain, blood in stool, constipation, diarrhea, heartburn, melena, nausea and vomiting.  Genitourinary:  Negative for dysuria, frequency and urgency.  Musculoskeletal:  Positive for joint pain. Negative for back pain.  Skin: Negative.  Negative for itching and rash.  Neurological:  Positive for dizziness. Negative for tingling, focal weakness,  weakness and headaches.  Endo/Heme/Allergies:  Does not bruise/bleed easily.  Psychiatric/Behavioral:  Negative for depression. The patient is not nervous/anxious and does not have insomnia.       PAST MEDICAL HISTORY :  Past Medical History:  Diagnosis Date   Anemia    CHF (congestive heart failure) ([place])    Headache 06/17/2022   Hypertension     PAST SURGICAL HISTORY :   Past Surgical History:  Procedure Laterality Date   COLONOSCOPY WITH PROPOFOL N/A 02/08/2021   Procedure: COLONOSCOPY WITH PROPOFOL;  Surgeon: Lin Landsman, MD;  Location: [facility] ENDOSCOPY;  Service: Gastroenterology;  Laterality: N/A;   EMBOLIZATION N/A 07/29/2021   Procedure: EMBOLIZATION;  Surgeon: Katha Cabal, MD;  Location: [place] [facility];  Service: Cardiovascular;  Laterality: N/A;   EMBOLIZATION N/A 09/02/2021   Procedure: EMBOLIZATION;  Surgeon: Katha Cabal, MD;  Location: [place] [facility];  Service: Cardiovascular;  Laterality: N/A;   Emobolization  09/02/2021   ESOPHAGOGASTRODUODENOSCOPY N/A 02/08/2021   Procedure: ESOPHAGOGASTRODUODENOSCOPY (EGD);  Surgeon: Lin Landsman, MD;  Location: [facility] ENDOSCOPY;  Service: Gastroenterology;  Laterality: N/A;   GIVENS CAPSULE STUDY N/A 02/09/2021   Procedure: GIVENS CAPSULE STUDY;  Surgeon: Lesly Rubenstein, MD;  Location: [facility] ENDOSCOPY;  Service: Endoscopy;  Laterality: N/A;    FAMILY HISTORY :   Family History  Problem Relation Age of Onset   Diabetes Mother    COPD Mother    Heart Problems Mother    Emphysema Father    Hypercholesterolemia Brother    Diabetes Maternal Aunt    Obesity Maternal Aunt    Arthritis Maternal Aunt     SOCIAL HISTORY:   Social History   Tobacco Use   Smoking status: Never   Smokeless tobacco: Never  Vaping Use   Vaping Use: Never used  Substance Use Topics   Alcohol use: Not Currently    Comment: 6-7 months ago   Drug use: Not Currently    Types: Marijuana, Cocaine     ALLERGIES:  has No Known Allergies.  MEDICATIONS:  Current Outpatient Medications  Medication Sig Dispense Refill   furosemide (LASIX) 20 MG tablet Take 1 tablet (20 mg total) by mouth daily. 30 tablet 2   potassium chloride SA (KLOR-CON M) 20 MEQ tablet Take 1 tablet (20 mEq total) by mouth daily. 30 tablet 2   No current facility-administered medications for this visit.    PHYSICAL EXAMINATION:  BP (!) 161/100 (BP Location: Left Arm, Patient Position: Sitting)   Pulse 62   Temp (!) 96.7 F (35.9 C) (Tympanic)   Wt (!) 340 lb 9.6 oz (154.5 kg)   BMI 50.30 kg/m   Filed Weights   10/05/22 1300  Weight: (!) 340 lb 9.6 oz (154.5 kg)     Physical Exam Vitals and nursing note reviewed.  HENT:     Head: Normocephalic and atraumatic.     Mouth/Throat:     Pharynx: Oropharynx is clear.  Eyes:     Extraocular Movements: Extraocular movements intact.     Pupils: Pupils are equal, round, and reactive to light.  Cardiovascular:     Rate and Rhythm: Normal rate and regular rhythm.  Pulmonary:     Comments: Decreased breath sounds bilaterally.  Abdominal:     Palpations: Abdomen is soft.  Musculoskeletal:        General: Normal range of motion.     Cervical back: Normal range of motion.  Skin:    General: Skin is warm.  Neurological:     General: No focal deficit present.     Mental Status: She is alert and oriented to person, place, and time.  Psychiatric:        Behavior: Behavior normal.        Judgment: Judgment normal.        LABORATORY DATA:  I have reviewed the data as listed    Component Value Date/Time   NA 135 10/05/2022 1307   K 3.5 10/05/2022 1307   CL 103 10/05/2022 1307   CO2 25 10/05/2022 1307   GLUCOSE 87 10/05/2022 1307   BUN 9 10/05/2022 1307   CREATININE 0.88 10/05/2022 1307   CALCIUM 8.8 (L) 10/05/2022 1307   PROT 7.2 06/16/2022 1436   ALBUMIN 3.6 06/16/2022 1436   AST 25 06/16/2022 1436   ALT 12 06/16/2022 1436   ALKPHOS 34 (L)  06/16/2022 1436   BILITOT 0.7 06/16/2022 1436   GFRNONAA >60 10/05/2022 1307    No results found for: "SPEP", "UPEP"  Lab Results  Component Value Date   WBC 5.6 10/05/2022   NEUTROABS 3.1 10/05/2022   HGB 10.9 (L) 10/05/2022   HCT 34.7 (L) 10/05/2022   MCV 70.7 (L) 10/05/2022   PLT 196 10/05/2022      Chemistry      Component Value Date/Time   NA 135 10/05/2022 1307   K 3.5 10/05/2022 1307   CL 103 10/05/2022 1307   CO2 25 10/05/2022 1307   BUN 9 10/05/2022 1307   CREATININE 0.88 10/05/2022 1307      Component Value Date/Time   CALCIUM 8.8 (L) 10/05/2022 1307   ALKPHOS 34 (L) 06/16/2022 1436   AST 25 06/16/2022 1436   ALT 12 06/16/2022 1436   BILITOT 0.7 06/16/2022 1436  RADIOGRAPHIC STUDIES: I have personally reviewed the radiological images as listed and agreed with the findings in the report. No results found.   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 S/p IV Venofer.    #  JAN 2024- iron sat- 11 &%; feritin-8. Today hemoglobin is 10.9 MCV is low.  Proceed with venofer today.  Again recommend compliance with oral pills; especially given poor IV access.   # Menorrhagia-fibroids s/p gynecology evaluation. S/p partial embolization left & right uterine artery  [Dr.Dew]-DC 2022]- stable.    # IV access: POOR discussed continue hydration.  We will have to consider either port placement if patient needs ongoing labs/infusions.  Patient understands need for hydration prior to IV access.  needs transportation # DISPOSITION # venofer today  # follow up in 4 months- MD; labs- cbc;bmp; iron studies;ferritin; possible- venofer- Dr.B    Orders Placed This Encounter  Procedures   CBC with Differential/Platelet    Standing Status:   Future    Standing Expiration Date:   10/04/735   Basic  metabolic panel    Standing Status:   Future    Standing Expiration Date:   10/05/2023   Iron and TIBC([facility])    Standing Status:   Future    Standing Expiration Date:   10/06/2023   Ferritin    Standing Status:   Future    Standing Expiration Date:   10/06/2023   All questions were answered. The patient knows to call the clinic with any problems, questions or concerns.      Cammie Sickle, MD 10/12/2022 9:20 AM

## 2022-10-15 ENCOUNTER — Telehealth: Payer: Self-pay | Admitting: Nurse Practitioner

## 2022-10-15 ENCOUNTER — Other Ambulatory Visit: Payer: Self-pay

## 2022-10-15 ENCOUNTER — Encounter: Payer: Self-pay | Admitting: Internal Medicine

## 2022-10-15 ENCOUNTER — Encounter: Payer: Self-pay | Admitting: Nurse Practitioner

## 2022-10-15 ENCOUNTER — Ambulatory Visit: Payer: Medicaid Other | Admitting: Nurse Practitioner

## 2022-10-15 VITALS — BP 140/78 | HR 64 | Temp 97.0°F | Resp 16 | Ht 66.0 in | Wt 336.8 lb

## 2022-10-15 DIAGNOSIS — Z1231 Encounter for screening mammogram for malignant neoplasm of breast: Secondary | ICD-10-CM | POA: Diagnosis not present

## 2022-10-15 DIAGNOSIS — R600 Localized edema: Secondary | ICD-10-CM

## 2022-10-15 DIAGNOSIS — E782 Mixed hyperlipidemia: Secondary | ICD-10-CM | POA: Diagnosis not present

## 2022-10-15 DIAGNOSIS — Z23 Encounter for immunization: Secondary | ICD-10-CM

## 2022-10-15 DIAGNOSIS — I509 Heart failure, unspecified: Secondary | ICD-10-CM

## 2022-10-15 MED ORDER — TETANUS-DIPHTH-ACELL PERTUSSIS 5-2.5-18.5 LF-MCG/0.5 IM SUSY
0.5000 mL | PREFILLED_SYRINGE | Freq: Once | INTRAMUSCULAR | 0 refills | Status: AC
  Filled 2022-10-15 – 2022-10-22 (×4): qty 0.5, 1d supply, fill #0

## 2022-10-15 MED ORDER — ZOSTER VAC RECOMB ADJUVANTED 50 MCG/0.5ML IM SUSR
0.5000 mL | Freq: Once | INTRAMUSCULAR | 0 refills | Status: AC
  Filled 2022-10-15 – 2022-10-22 (×4): qty 0.5, 1d supply, fill #0

## 2022-10-15 MED ORDER — FUROSEMIDE 40 MG PO TABS
40.0000 mg | ORAL_TABLET | Freq: Every day | ORAL | 3 refills | Status: DC
  Filled 2022-10-15: qty 30, 30d supply, fill #0
  Filled 2022-11-13: qty 30, 30d supply, fill #1
  Filled 2022-12-17: qty 30, 30d supply, fill #2

## 2022-10-15 MED ORDER — POTASSIUM CHLORIDE CRYS ER 20 MEQ PO TBCR
20.0000 meq | EXTENDED_RELEASE_TABLET | Freq: Every day | ORAL | 2 refills | Status: DC
  Filled 2022-10-15: qty 30, 30d supply, fill #0
  Filled 2022-11-13: qty 30, 30d supply, fill #1
  Filled 2022-12-17: qty 30, 30d supply, fill #2

## 2022-10-15 NOTE — Telephone Encounter (Signed)
Notified patient of mammogram appointment date, time & location-[place]

## 2022-10-15 NOTE — Progress Notes (Signed)
[facility] [place], [place] [address]  Internal MEDICINE  Office Visit Note  Patient Name: Lauren Hensley  [id number]  [id number]  Date of Service: 10/15/2022   Complaints/HPI Pt is here for establishment of PCP. Chief Complaint  Patient presents with   New Patient (Initial Visit)    HPI Lauren Hensley presents for a new patient visit to establish care.  Well-appearing 57 y.o. female with CHF and hypertension  She is not working and lives with brother, niece, and son Diet: fairly healthy  Exercise: not right now, due to leg swelling.  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  Routine CRC screening: done in 2022 Routine mammogram: due now, need order Pap smear: due in 2027 Labs: has labs ordered from oncology but has not had a lipid panel ordered.  New or worsening pain: legs and feet due to leg swelling.    Current Medication: Outpatient Encounter Medications as of 10/15/2022  Medication Sig   furosemide (LASIX) 40 MG tablet Take 1 tablet (40 mg total) by mouth daily.   [DISCONTINUED] potassium chloride SA (KLOR-CON M) 20 MEQ tablet Take 1 tablet (20 mEq total) by mouth daily.   [DISCONTINUED] Tdap (BOOSTRIX) 5-2.5-18.5 LF-MCG/0.5 injection Inject 0.5 mLs into the muscle once.   [DISCONTINUED] Zoster Vaccine Adjuvanted [facility]) injection Inject 0.5 mLs into the muscle once.   potassium chloride SA (KLOR-CON M) 20 MEQ tablet Take 1 tablet (20 mEq total) by mouth daily.   Tdap (BOOSTRIX) 5-2.5-18.5 LF-MCG/0.5 injection Inject 0.5 mLs into the muscle once for 1 dose.   Zoster Vaccine Adjuvanted [facility]) injection Inject 0.5 mLs into the muscle once for 1 dose.   [DISCONTINUED] furosemide (LASIX) 20 MG tablet Take 1 tablet (20 mg total) by mouth daily.   [DISCONTINUED] medroxyPROGESTERone (DEPO-PROVERA) 150 MG/ML injection Inject 1 mL (150 mg total) into the muscle every 3  (three) months. (Patient not taking: Reported on 04/30/2021)   No facility-administered encounter medications on file as of 10/15/2022.    Surgical History: Past Surgical History:  Procedure Laterality Date   COLONOSCOPY WITH PROPOFOL N/A 02/08/2021   Procedure: COLONOSCOPY WITH PROPOFOL;  Surgeon: Lin Landsman, MD;  Location: [facility] ENDOSCOPY;  Service: Gastroenterology;  Laterality: N/A;   EMBOLIZATION N/A 07/29/2021   Procedure: EMBOLIZATION;  Surgeon: Katha Cabal, MD;  Location: [place] [facility];  Service: Cardiovascular;  Laterality: N/A;   EMBOLIZATION N/A 09/02/2021   Procedure: EMBOLIZATION;  Surgeon: Katha Cabal, MD;  Location: [place] [facility];  Service: Cardiovascular;  Laterality: N/A;   Emobolization  09/02/2021   ESOPHAGOGASTRODUODENOSCOPY N/A 02/08/2021   Procedure: ESOPHAGOGASTRODUODENOSCOPY (EGD);  Surgeon: Lin Landsman, MD;  Location: [facility] ENDOSCOPY;  Service: Gastroenterology;  Laterality: N/A;   GIVENS CAPSULE STUDY N/A 02/09/2021   Procedure: GIVENS CAPSULE STUDY;  Surgeon: Lesly Rubenstein, MD;  Location: [facility] ENDOSCOPY;  Service: Endoscopy;  Laterality: N/A;    Medical History: Past Medical History:  Diagnosis Date   Anemia    CHF (congestive heart failure) ([place])    Headache 06/17/2022   Hypertension     Family History: Family History  Problem Relation Age of Onset   Diabetes Mother    COPD Mother    Heart Problems Mother    Emphysema Father    Hypercholesterolemia Brother    Diabetes Maternal Aunt    Obesity Maternal Aunt    Arthritis Maternal Aunt  Social History   Socioeconomic History   Marital status: Single    Spouse name: Not on file   Number of children: 1   Years of education: Not on file   Highest education level: Not on file  Occupational History   Not on file  Tobacco Use   Smoking status: Never   Smokeless tobacco: Never  Vaping Use   Vaping Use: Never used  Substance and Sexual Activity    Alcohol use: Not Currently    Comment: 6-7 months ago   Drug use: Not Currently    Types: Marijuana, Cocaine   Sexual activity: Not Currently  Other Topics Concern   Not on file  Social History Narrative   Lives with brother currently and niece    Social Determinants of Health   Financial Resource Strain: High Risk (07/16/2022)   Overall Financial Resource Strain (CARDIA)    Difficulty of Paying Living Expenses: Very hard  Food Insecurity: No Food Insecurity (07/16/2022)   Hunger Vital Sign    Worried About Running Out of Food in the Last Year: Never true    Ran Out of Food in the Last Year: Never true  Transportation Needs: Unmet Transportation Needs (10/05/2022)   PRAPARE - Hydrologist (Medical): Yes    Lack of Transportation (Non-Medical): Yes  Physical Activity: Not on file  Stress: Not on file  Social Connections: Not on file  Intimate Partner Violence: Not At Risk (06/17/2022)   Humiliation, Afraid, Rape, and Kick questionnaire    Fear of Current or Ex-Partner: No    Emotionally Abused: No    Physically Abused: No    Sexually Abused: No     Review of Systems  Constitutional:  Positive for fatigue. Negative for chills and unexpected weight change.  HENT:  Negative for congestion, postnasal drip, rhinorrhea, sneezing and sore throat.   Eyes:  Negative for redness.  Respiratory:  Negative for cough, chest tightness and shortness of breath.   Cardiovascular:  Positive for leg swelling. Negative for chest pain and palpitations.  Gastrointestinal:  Negative for abdominal pain, constipation, diarrhea, nausea and vomiting.  Genitourinary:  Negative for dysuria and frequency.  Musculoskeletal:  Negative for arthralgias, back pain, joint swelling and neck pain.  Skin:  Negative for rash.  Neurological: Negative.  Negative for tremors and numbness.  Hematological:  Negative for adenopathy. Does not bruise/bleed easily.  Psychiatric/Behavioral:   Negative for behavioral problems (Depression), sleep disturbance and suicidal ideas. The patient is not nervous/anxious.     Vital Signs: BP (!) 140/78   Pulse 64   Temp (!) 97 F (36.1 C)   Resp 16   Ht '5\' 6"'$  (1.676 m)   Wt (!) 336 lb 12.8 oz (152.8 kg)   SpO2 95%   BMI 54.36 kg/m    Physical Exam Vitals reviewed.  Constitutional:      General: She is not in acute distress.    Appearance: Normal appearance. She is obese. She is not ill-appearing.  HENT:     Head: Normocephalic and atraumatic.  Eyes:     Pupils: Pupils are equal, round, and reactive to light.  Cardiovascular:     Rate and Rhythm: Normal rate and regular rhythm.  Pulmonary:     Effort: Pulmonary effort is normal. No respiratory distress.  Musculoskeletal:     Right lower leg: 2+ Pitting Edema present.     Left lower leg: 2+ Pitting Edema present.  Neurological:  Mental Status: She is alert and oriented to person, place, and time.  Psychiatric:        Mood and Affect: Mood normal.        Behavior: Behavior normal.       Assessment/Plan: 1. Bilateral lower extremity edema Furosemide dose increased to 40 mg daily. Follow up in 4 weeks to reassess. - furosemide (LASIX) 40 MG tablet; Take 1 tablet (40 mg total) by mouth daily.  Dispense: 30 tablet; Refill: 3  2. Chronic congestive heart failure, unspecified heart failure type [facility]) Prior diagnosis. Refills ordered, furosemide dose increased.  - potassium chloride SA (KLOR-CON M) 20 MEQ tablet; Take 1 tablet (20 mEq total) by mouth daily.  Dispense: 30 tablet; Refill: 2 - furosemide (LASIX) 40 MG tablet; Take 1 tablet (40 mg total) by mouth daily.  Dispense: 30 tablet; Refill: 3  3. Mixed hyperlipidemia Routine lab ordered - Lipid Profile  4. Encounter for screening mammogram for malignant neoplasm of breast Routine mammogram ordered - MM 3D SCREEN BREAST BILATERAL; Future  5. Need for vaccination - Tdap ([place]) 5-2.5-18.5 LF-MCG/0.5  injection; Inject 0.5 mLs into the muscle once for 1 dose.  Dispense: 0.5 mL; Refill: 0 - Zoster Vaccine Adjuvanted [facility]) injection; Inject 0.5 mLs into the muscle once for 1 dose.  Dispense: 0.5 mL; Refill: 0    General Counseling: Cookie verbalizes understanding of the findings of todays visit and agrees with plan of treatment. I have discussed any further diagnostic evaluation that may be needed or ordered today. We also reviewed her medications today. she has been encouraged to call the office with any questions or concerns that should arise related to todays visit.    Orders Placed This Encounter  Procedures   MM 3D SCREEN BREAST BILATERAL   Lipid Profile    Meds ordered this encounter  Medications   Tdap (BOOSTRIX) 5-2.5-18.5 LF-MCG/0.5 injection    Sig: Inject 0.5 mLs into the muscle once for 1 dose.    Dispense:  0.5 mL    Refill:  0   Zoster Vaccine Adjuvanted [facility]) injection    Sig: Inject 0.5 mLs into the muscle once for 1 dose.    Dispense:  0.5 mL    Refill:  0   potassium chloride SA (KLOR-CON M) 20 MEQ tablet    Sig: Take 1 tablet (20 mEq total) by mouth daily.    Dispense:  30 tablet    Refill:  2   furosemide (LASIX) 40 MG tablet    Sig: Take 1 tablet (40 mg total) by mouth daily.    Dispense:  30 tablet    Refill:  3    Return for CPE, Nicolle Heward PCP at earliest available opening, and need to be seen in 1 month for f/u leg swelling.  Time spent:30 Minutes Time spent with patient included reviewing progress notes, labs, imaging studies, and discussing plan for follow up.   [place] Controlled Substance Database was reviewed by me for overdose risk score (ORS)   This patient was seen by Jonetta Osgood, FNP-C in collaboration with Dr. Clayborn Bigness as a part of collaborative care agreement.   Ivo Moga R. Valetta Fuller, MSN, FNP-C Internal Medicine

## 2022-10-17 ENCOUNTER — Encounter: Payer: Self-pay | Admitting: Nurse Practitioner

## 2022-10-19 ENCOUNTER — Other Ambulatory Visit: Payer: Self-pay

## 2022-10-19 ENCOUNTER — Encounter: Payer: Self-pay | Admitting: Internal Medicine

## 2022-10-22 ENCOUNTER — Other Ambulatory Visit: Payer: Self-pay

## 2022-11-05 ENCOUNTER — Ambulatory Visit
Admission: RE | Admit: 2022-11-05 | Discharge: 2022-11-05 | Disposition: A | Discharge status: Home / Self Care | Payer: 59 | Source: Ambulatory Visit | Attending: Nurse Practitioner | Admitting: Nurse Practitioner

## 2022-11-05 DIAGNOSIS — Z1231 Encounter for screening mammogram for malignant neoplasm of breast: Secondary | ICD-10-CM | POA: Diagnosis present

## 2022-11-12 ENCOUNTER — Encounter: Payer: Self-pay | Admitting: Nurse Practitioner

## 2022-11-12 ENCOUNTER — Ambulatory Visit (INDEPENDENT_AMBULATORY_CARE_PROVIDER_SITE_OTHER): Payer: Medicaid Other | Admitting: Nurse Practitioner

## 2022-11-12 ENCOUNTER — Other Ambulatory Visit: Payer: Self-pay

## 2022-11-12 VITALS — BP 130/85 | HR 93 | Temp 98.4°F | Resp 16 | Ht 66.0 in | Wt 329.0 lb

## 2022-11-12 DIAGNOSIS — R600 Localized edema: Secondary | ICD-10-CM

## 2022-11-12 DIAGNOSIS — I10 Essential (primary) hypertension: Secondary | ICD-10-CM

## 2022-11-12 MED ORDER — HYDROCHLOROTHIAZIDE 12.5 MG PO TABS
12.5000 mg | ORAL_TABLET | Freq: Every day | ORAL | 3 refills | Status: DC
  Filled 2022-11-12: qty 90, 90d supply, fill #0

## 2022-11-12 NOTE — Progress Notes (Signed)
[facility] [place], [place] [address]  Internal MEDICINE  Office Visit Note  Patient Name: Lauren Hensley  [id number]  [id number]  Date of Service: 11/12/2022  Chief Complaint  Patient presents with   Follow-up    Leg swelling both     HPI Lauren Hensley presents for a follow-up visit for lower extremity edema and elevated BP. Lower extremity edema -- improving .  Taking HCTZ, BP improved when rechecked.    Current Medication: Outpatient Encounter Medications as of 11/12/2022  Medication Sig   furosemide (LASIX) 40 MG tablet Take 1 tablet (40 mg total) by mouth daily.   hydrochlorothiazide (HYDRODIURIL) 12.5 MG tablet Take 1 tablet (12.5 mg total) by mouth daily.   potassium chloride SA (KLOR-CON M) 20 MEQ tablet Take 1 tablet (20 mEq total) by mouth daily.   [DISCONTINUED] medroxyPROGESTERone (DEPO-PROVERA) 150 MG/ML injection Inject 1 mL (150 mg total) into the muscle every 3 (three) months. (Patient not taking: Reported on 04/30/2021)   No facility-administered encounter medications on file as of 11/12/2022.    Surgical History: Past Surgical History:  Procedure Laterality Date   COLONOSCOPY WITH PROPOFOL N/A 02/08/2021   Procedure: COLONOSCOPY WITH PROPOFOL;  Surgeon: Lin Landsman, MD;  Location: [facility] ENDOSCOPY;  Service: Gastroenterology;  Laterality: N/A;   EMBOLIZATION N/A 07/29/2021   Procedure: EMBOLIZATION;  Surgeon: Katha Cabal, MD;  Location: [facility] CV LAB;  Service: Cardiovascular;  Laterality: N/A;   EMBOLIZATION N/A 09/02/2021   Procedure: EMBOLIZATION;  Surgeon: Katha Cabal, MD;  Location: [place] CV LAB;  Service: Cardiovascular;  Laterality: N/A;   Emobolization  09/02/2021   ESOPHAGOGASTRODUODENOSCOPY N/A 02/08/2021   Procedure: ESOPHAGOGASTRODUODENOSCOPY (EGD);  Surgeon: Lin Landsman, MD;  Location: [facility] ENDOSCOPY;  Service: Gastroenterology;  Laterality: N/A;   GIVENS CAPSULE STUDY N/A 02/09/2021    Procedure: GIVENS CAPSULE STUDY;  Surgeon: Lesly Rubenstein, MD;  Location: [facility] ENDOSCOPY;  Service: Endoscopy;  Laterality: N/A;    Medical History: Past Medical History:  Diagnosis Date   Anemia    CHF (congestive heart failure) ([place])    Headache 06/17/2022   Hypertension     Family History: Family History  Problem Relation Age of Onset   Diabetes Mother    COPD Mother    Heart Problems Mother    Emphysema Father    Diabetes Maternal Aunt    Obesity Maternal Aunt    Arthritis Maternal Aunt    Hypercholesterolemia Brother    Breast cancer Neg Hx     Social History   Socioeconomic History   Marital status: Single    Spouse name: Not on file   Number of children: 1   Years of education: Not on file   Highest education level: Not on file  Occupational History   Not on file  Tobacco Use   Smoking status: Never   Smokeless tobacco: Never  Vaping Use   Vaping Use: Never used  Substance and Sexual Activity   Alcohol use: Not Currently    Comment: 6-7 months ago   Drug use: Not Currently    Types: Marijuana, Cocaine   Sexual activity: Not Currently  Other Topics Concern   Not on file  Social History Narrative   Lives with brother currently and niece    Social Determinants of Health   Financial Resource Strain: High Risk (07/16/2022)   Overall Financial Resource Strain (CARDIA)    Difficulty of Paying Living Expenses: Very hard  Food Insecurity: No Food  Insecurity (07/16/2022)   Hunger Vital Sign    Worried About Running Out of Food in the Last Year: Never true    Ran Out of Food in the Last Year: Never true  Transportation Needs: Unmet Transportation Needs (10/05/2022)   PRAPARE - Hydrologist (Medical): Yes    Lack of Transportation (Non-Medical): Yes  Physical Activity: Not on file  Stress: Not on file  Social Connections: Not on file  Intimate Partner Violence: Not At Risk (06/17/2022)   Humiliation, Afraid, Rape, and Kick  questionnaire    Fear of Current or Ex-Partner: No    Emotionally Abused: No    Physically Abused: No    Sexually Abused: No      Review of Systems  Constitutional:  Positive for fatigue. Negative for chills and unexpected weight change.  HENT:  Negative for congestion, postnasal drip, rhinorrhea, sneezing and sore throat.   Eyes:  Negative for redness.  Respiratory:  Negative for cough, chest tightness and shortness of breath.   Cardiovascular:  Positive for leg swelling (improving). Negative for chest pain and palpitations.  Gastrointestinal:  Negative for abdominal pain, constipation, diarrhea, nausea and vomiting.  Genitourinary:  Negative for dysuria and frequency.  Musculoskeletal:  Negative for arthralgias, back pain, joint swelling and neck pain.  Skin:  Negative for rash.  Neurological: Negative.  Negative for tremors and numbness.  Hematological:  Negative for adenopathy. Does not bruise/bleed easily.  Psychiatric/Behavioral:  Negative for behavioral problems (Depression), sleep disturbance and suicidal ideas. The patient is not nervous/anxious.     Vital Signs: BP 130/85 Comment: 160/90  Pulse 93   Temp 98.4 F (36.9 C)   Resp 16   Ht '5\' 6"'$  (1.676 m)   Wt (!) 329 lb (149.2 kg)   LMP 10/21/2022   SpO2 96%   BMI 53.10 kg/m    Physical Exam Vitals reviewed.  Constitutional:      General: She is not in acute distress.    Appearance: Normal appearance. She is obese. She is not ill-appearing.  HENT:     Head: Normocephalic and atraumatic.  Eyes:     Pupils: Pupils are equal, round, and reactive to light.  Cardiovascular:     Rate and Rhythm: Normal rate and regular rhythm.  Pulmonary:     Effort: Pulmonary effort is normal. No respiratory distress.  Musculoskeletal:     Right lower leg: 1+ Edema present.     Left lower leg: 1+ Edema present.  Neurological:     Mental Status: She is alert and oriented to person, place, and time.  Psychiatric:        Mood  and Affect: Mood normal.        Behavior: Behavior normal.        Assessment/Plan: 1. Bilateral lower extremity edema Continue HCTZ as prescribed.  - hydrochlorothiazide (HYDRODIURIL) 12.5 MG tablet; Take 1 tablet (12.5 mg total) by mouth daily.  Dispense: 90 tablet; Refill: 3  2. Essential hypertension Continue HCTZ as prescribed.  - hydrochlorothiazide (HYDRODIURIL) 12.5 MG tablet; Take 1 tablet (12.5 mg total) by mouth daily.  Dispense: 90 tablet; Refill: 3   General Counseling: Sharvi verbalizes understanding of the findings of todays visit and agrees with plan of treatment. I have discussed any further diagnostic evaluation that may be needed or ordered today. We also reviewed her medications today. she has been encouraged to call the office with any questions or concerns that should arise related to todays visit.  No orders of the defined types were placed in this encounter.   Meds ordered this encounter  Medications   hydrochlorothiazide (HYDRODIURIL) 12.5 MG tablet    Sig: Take 1 tablet (12.5 mg total) by mouth daily.    Dispense:  90 tablet    Refill:  3    Return in about 4 weeks (around 12/10/2022) for F/U, BP check, Yuritzy Zehring PCP.   Total time spent:20 Minutes Time spent includes review of chart, medications, test results, and follow up plan with the patient.   [place] Controlled Substance Database was reviewed by me.  This patient was seen by Jonetta Osgood, FNP-C in collaboration with Dr. Clayborn Bigness as a part of collaborative care agreement.   Linville Decarolis R. Valetta Fuller, MSN, FNP-C Internal medicine

## 2022-11-13 ENCOUNTER — Ambulatory Visit (INDEPENDENT_AMBULATORY_CARE_PROVIDER_SITE_OTHER): Payer: Medicaid Other | Admitting: Nurse Practitioner

## 2022-11-13 ENCOUNTER — Other Ambulatory Visit: Payer: Self-pay

## 2022-11-13 ENCOUNTER — Encounter: Payer: Self-pay | Admitting: Nurse Practitioner

## 2022-11-13 VITALS — BP 147/80 | HR 88 | Temp 97.5°F | Resp 16 | Ht 66.0 in | Wt 329.4 lb

## 2022-11-13 DIAGNOSIS — R600 Localized edema: Secondary | ICD-10-CM | POA: Diagnosis not present

## 2022-11-13 DIAGNOSIS — I10 Essential (primary) hypertension: Secondary | ICD-10-CM

## 2022-11-13 DIAGNOSIS — D649 Anemia, unspecified: Secondary | ICD-10-CM

## 2022-11-13 DIAGNOSIS — Z23 Encounter for immunization: Secondary | ICD-10-CM

## 2022-11-13 DIAGNOSIS — R300 Dysuria: Secondary | ICD-10-CM

## 2022-11-13 DIAGNOSIS — Z0001 Encounter for general adult medical examination with abnormal findings: Secondary | ICD-10-CM

## 2022-11-13 DIAGNOSIS — E782 Mixed hyperlipidemia: Secondary | ICD-10-CM

## 2022-11-13 MED ORDER — ZOSTER VAC RECOMB ADJUVANTED 50 MCG/0.5ML IM SUSR
0.5000 mL | Freq: Once | INTRAMUSCULAR | 0 refills | Status: AC
  Filled 2022-11-13: qty 0.5, 1d supply, fill #0

## 2022-11-13 MED ORDER — TETANUS-DIPHTH-ACELL PERTUSSIS 5-2.5-18.5 LF-MCG/0.5 IM SUSP
0.5000 mL | Freq: Once | INTRAMUSCULAR | 0 refills | Status: AC
  Filled 2022-11-13: qty 0.5, 1d supply, fill #0

## 2022-11-13 NOTE — Progress Notes (Signed)
[facility] [place], [place] [address]  Internal MEDICINE  Office Visit Note  Patient Name: Lauren Hensley  [id number]  [id number]  Date of Service: 11/13/2022  Chief Complaint  Patient presents with   Annual Exam   Hypertension    HPI Anaum presents for an annual well visit and physical exam.  Well-appearing 57 y.o. female with CHF and hypertension  Routine CRC screening: done in 2022 Routine mammogram: done on 11/05/22 -- was normal Pap smear: due in 2027 Labs: waiting for labs to be done at cancer center Leg swelling and pain is slightly improved with increased lasix dose but patient will start HCTZ dose 12.5 mg today which should improve swelling more.    Current Medication: Outpatient Encounter Medications as of 11/13/2022  Medication Sig   furosemide (LASIX) 40 MG tablet Take 1 tablet (40 mg total) by mouth daily.   hydrochlorothiazide (HYDRODIURIL) 12.5 MG tablet Take 1 tablet (12.5 mg total) by mouth daily.   potassium chloride SA (KLOR-CON M) 20 MEQ tablet Take 1 tablet (20 mEq total) by mouth daily.   [DISCONTINUED] Tdap (BOOSTRIX) 5-2.5-18.5 LF-MCG/0.5 injection Inject 0.5 mLs into the muscle once.   [DISCONTINUED] Zoster Vaccine Adjuvanted [facility]) injection Inject 0.5 mLs into the muscle once.   Tdap (BOOSTRIX) 5-2.5-18.5 LF-MCG/0.5 injection Inject 0.5 mLs into the muscle once for 1 dose.   Zoster Vaccine Adjuvanted [facility]) injection Inject 0.5 mLs into the muscle once for 1 dose.   [DISCONTINUED] medroxyPROGESTERone (DEPO-PROVERA) 150 MG/ML injection Inject 1 mL (150 mg total) into the muscle every 3 (three) months. (Patient not taking: Reported on 04/30/2021)   No facility-administered encounter medications on file as of 11/13/2022.    Surgical History: Past Surgical History:  Procedure Laterality Date   COLONOSCOPY WITH PROPOFOL N/A 02/08/2021   Procedure: COLONOSCOPY WITH PROPOFOL;  Surgeon: Lin Landsman, MD;  Location: [facility]  ENDOSCOPY;  Service: Gastroenterology;  Laterality: N/A;   EMBOLIZATION N/A 07/29/2021   Procedure: EMBOLIZATION;  Surgeon: Katha Cabal, MD;  Location: [place] CV LAB;  Service: Cardiovascular;  Laterality: N/A;   EMBOLIZATION N/A 09/02/2021   Procedure: EMBOLIZATION;  Surgeon: Katha Cabal, MD;  Location: [place] CV LAB;  Service: Cardiovascular;  Laterality: N/A;   Emobolization  09/02/2021   ESOPHAGOGASTRODUODENOSCOPY N/A 02/08/2021   Procedure: ESOPHAGOGASTRODUODENOSCOPY (EGD);  Surgeon: Lin Landsman, MD;  Location: [facility] ENDOSCOPY;  Service: Gastroenterology;  Laterality: N/A;   GIVENS CAPSULE STUDY N/A 02/09/2021   Procedure: GIVENS CAPSULE STUDY;  Surgeon: Lesly Rubenstein, MD;  Location: [facility] ENDOSCOPY;  Service: Endoscopy;  Laterality: N/A;    Medical History: Past Medical History:  Diagnosis Date   Anemia    CHF (congestive heart failure) ([place])    Headache 06/17/2022   Hypertension     Family History: Family History  Problem Relation Age of Onset   Diabetes Mother    COPD Mother    Heart Problems Mother    Emphysema Father    Diabetes Maternal Aunt    Obesity Maternal Aunt    Arthritis Maternal Aunt    Hypercholesterolemia Brother    Breast cancer Neg Hx     Social History   Socioeconomic History   Marital status: Single    Spouse name: Not on file   Number of children: 1   Years of education: Not on file   Highest education level: Not on file  Occupational History   Not on file  Tobacco Use   Smoking status:  Never   Smokeless tobacco: Never  Vaping Use   Vaping Use: Never used  Substance and Sexual Activity   Alcohol use: Not Currently    Comment: 6-7 months ago   Drug use: Not Currently    Types: Marijuana, Cocaine   Sexual activity: Not Currently  Other Topics Concern   Not on file  Social History Narrative   Lives with brother currently and niece    Social Determinants of Health   Financial Resource Strain:  High Risk (07/16/2022)   Overall Financial Resource Strain (CARDIA)    Difficulty of Paying Living Expenses: Very hard  Food Insecurity: No Food Insecurity (07/16/2022)   Hunger Vital Sign    Worried About Running Out of Food in the Last Year: Never true    Ran Out of Food in the Last Year: Never true  Transportation Needs: Unmet Transportation Needs (10/05/2022)   PRAPARE - Hydrologist (Medical): Yes    Lack of Transportation (Non-Medical): Yes  Physical Activity: Not on file  Stress: Not on file  Social Connections: Not on file  Intimate Partner Violence: Not At Risk (06/17/2022)   Humiliation, Afraid, Rape, and Kick questionnaire    Fear of Current or Ex-Partner: No    Emotionally Abused: No    Physically Abused: No    Sexually Abused: No      Review of Systems  Constitutional:  Negative for activity change, appetite change, chills, fatigue, fever and unexpected weight change.  HENT: Negative.  Negative for congestion, ear pain, rhinorrhea, sore throat and trouble swallowing.   Eyes: Negative.   Respiratory: Negative.  Negative for cough, chest tightness, shortness of breath and wheezing.   Cardiovascular:  Positive for leg swelling. Negative for chest pain and palpitations.  Gastrointestinal: Negative.  Negative for abdominal pain, blood in stool, constipation, diarrhea, nausea and vomiting.  Endocrine: Negative.   Genitourinary: Negative.  Negative for difficulty urinating, dysuria, frequency, hematuria and urgency.  Musculoskeletal: Negative.  Negative for arthralgias, back pain, joint swelling, myalgias and neck pain.  Skin: Negative.  Negative for rash and wound.  Allergic/Immunologic: Negative.  Negative for immunocompromised state.  Neurological: Negative.  Negative for dizziness, seizures, numbness and headaches.  Hematological: Negative.   Psychiatric/Behavioral: Negative.  Negative for behavioral problems, self-injury and suicidal ideas.  The patient is not nervous/anxious.     Vital Signs: BP (!) 147/80   Pulse 88   Temp (!) 97.5 F (36.4 C)   Resp 16   Ht 5' 6"$  (1.676 m)   Wt (!) 329 lb 6.4 oz (149.4 kg)   LMP 10/21/2022   SpO2 98%   BMI 53.17 kg/m    Physical Exam Vitals reviewed.  Constitutional:      General: She is awake. She is not in acute distress.    Appearance: Normal appearance. She is well-developed and well-groomed. She is obese. She is not ill-appearing or diaphoretic.  HENT:     Head: Normocephalic and atraumatic.     Right Ear: Tympanic membrane, ear canal and external ear normal.     Left Ear: Tympanic membrane, ear canal and external ear normal.     Nose: Nose normal. No congestion or rhinorrhea.     Mouth/Throat:     Lips: Pink.     Mouth: Mucous membranes are moist.     Pharynx: Oropharynx is clear. Uvula midline. No oropharyngeal exudate or posterior oropharyngeal erythema.  Eyes:     General: Lids are normal. Vision grossly  intact. Gaze aligned appropriately.     Extraocular Movements: Extraocular movements intact.     Conjunctiva/sclera: Conjunctivae normal.     Pupils: Pupils are equal, round, and reactive to light.  Neck:     Thyroid: No thyromegaly.     Vascular: No JVD.     Trachea: Trachea and phonation normal. No tracheal deviation.  Cardiovascular:     Rate and Rhythm: Normal rate and regular rhythm.     Pulses: Normal pulses.     Heart sounds: Normal heart sounds, S1 normal and S2 normal. No murmur heard.    No friction rub. No gallop.  Pulmonary:     Effort: Pulmonary effort is normal. No accessory muscle usage or respiratory distress.     Breath sounds: Normal breath sounds and air entry. No wheezing or rales.  Chest:     Chest wall: No tenderness.     Comments: Declined clinical breast exam, mammogram done this month Abdominal:     General: Bowel sounds are normal.     Palpations: Abdomen is soft. There is no shifting dullness, fluid wave, mass or pulsatile mass.   Musculoskeletal:        General: Normal range of motion.     Cervical back: Normal range of motion and neck supple.     Right lower leg: 1+ Pitting Edema present.     Left lower leg: 1+ Pitting Edema present.  Lymphadenopathy:     Cervical: No cervical adenopathy.  Skin:    General: Skin is warm and dry.     Capillary Refill: Capillary refill takes less than 2 seconds.  Neurological:     Mental Status: She is alert and oriented to person, place, and time.     Cranial Nerves: No cranial nerve deficit.     Coordination: Coordination normal.     Gait: Gait normal.  Psychiatric:        Mood and Affect: Mood and affect normal.        Behavior: Behavior normal. Behavior is cooperative.        Thought Content: Thought content normal.        Judgment: Judgment normal.        Assessment/Plan: 1. Encounter for routine adult health examination with abnormal findings Age-appropriate preventive screenings and vaccinations discussed, annual physical exam completed. Routine labs for health maintenance ordered and will be drawn at the cancer center soon. PHM updated.   2. Bilateral lower extremity edema Will be starting HCTZ today which should help with selling and BP  3. Essential hypertension Takes lasix and will be starting HCTZ today which should help her BP  4. Mixed hyperlipidemia Will have cholesterol panel drawn when she has her labs drawn at the cancer center soon   5. Symptomatic anemia Sees heme/onc, Dr. Rogue Bussing  6. Dysuria Routine urinalysis done - UA/M w/rflx Culture, Routine  7. Need for vaccination - Tdap ([place]) 5-2.5-18.5 LF-MCG/0.5 injection; Inject 0.5 mLs into the muscle once for 1 dose.  Dispense: 0.5 mL; Refill: 0 - Zoster Vaccine Adjuvanted [facility]) injection; Inject 0.5 mLs into the muscle once for 1 dose.  Dispense: 0.5 mL; Refill: 0     General Counseling: Akya verbalizes understanding of the findings of todays visit and agrees with plan of  treatment. I have discussed any further diagnostic evaluation that may be needed or ordered today. We also reviewed her medications today. she has been encouraged to call the office with any questions or concerns that should arise related to  todays visit.    Orders Placed This Encounter  Procedures   UA/M w/rflx Culture, Routine    Meds ordered this encounter  Medications   Tdap (BOOSTRIX) 5-2.5-18.5 LF-MCG/0.5 injection    Sig: Inject 0.5 mLs into the muscle once for 1 dose.    Dispense:  0.5 mL    Refill:  0   Zoster Vaccine Adjuvanted [facility]) injection    Sig: Inject 0.5 mLs into the muscle once for 1 dose.    Dispense:  0.5 mL    Refill:  0    Return in about 4 weeks (around 12/11/2022) for previously scheduled, F/U, BP check, Erlin Gardella PCP.   Total time spent:30 Minutes Time spent includes review of chart, medications, test results, and follow up plan with the patient.   [place] Controlled Substance Database was reviewed by me.  This patient was seen by Jonetta Osgood, FNP-C in collaboration with Dr. Clayborn Bigness as a part of collaborative care agreement.  Jazyah Butsch R. Valetta Fuller, MSN, FNP-C Internal medicine

## 2022-11-14 LAB — UA/M W/RFLX CULTURE, ROUTINE
Bilirubin, UA: NEGATIVE
Glucose, UA: NEGATIVE
Ketones, UA: NEGATIVE
Leukocytes,UA: NEGATIVE
Nitrite, UA: NEGATIVE
Protein,UA: NEGATIVE
Specific Gravity, UA: 1.009 (ref 1.005–1.030)
Urobilinogen, Ur: 0.2 mg/dL (ref 0.2–1.0)
pH, UA: 5 (ref 5.0–7.5)

## 2022-11-14 LAB — MICROSCOPIC EXAMINATION
Bacteria, UA: NONE SEEN
Casts: NONE SEEN /lpf
Epithelial Cells (non renal): NONE SEEN /hpf (ref 0–10)
RBC, Urine: NONE SEEN /hpf (ref 0–2)
WBC, UA: NONE SEEN /hpf (ref 0–5)

## 2022-11-22 ENCOUNTER — Encounter: Payer: Self-pay | Admitting: Nurse Practitioner

## 2022-12-10 ENCOUNTER — Encounter: Payer: Self-pay | Admitting: Nurse Practitioner

## 2022-12-10 ENCOUNTER — Ambulatory Visit (INDEPENDENT_AMBULATORY_CARE_PROVIDER_SITE_OTHER): Payer: Medicaid Other | Admitting: Nurse Practitioner

## 2022-12-10 VITALS — BP 129/81 | HR 64 | Temp 96.9°F | Resp 16 | Ht 66.0 in | Wt 324.6 lb

## 2022-12-10 DIAGNOSIS — E876 Hypokalemia: Secondary | ICD-10-CM | POA: Diagnosis not present

## 2022-12-10 DIAGNOSIS — T50905A Adverse effect of unspecified drugs, medicaments and biological substances, initial encounter: Secondary | ICD-10-CM | POA: Diagnosis not present

## 2022-12-10 DIAGNOSIS — I10 Essential (primary) hypertension: Secondary | ICD-10-CM | POA: Diagnosis not present

## 2022-12-10 DIAGNOSIS — R600 Localized edema: Secondary | ICD-10-CM

## 2022-12-10 NOTE — Progress Notes (Signed)
[facility] [place], [place] [address]  Internal MEDICINE  Office Visit Note  Patient Name: Lauren Hensley  [id number]  [id number]  Date of Service: 12/10/2022  Chief Complaint  Patient presents with   Follow-up   Hypertension    HPI Skylinn presents for a follow-up visit for hypertension and edema Hypertension -- improved with current medications  Edema is improving  Lost 5 lbs since last visit At risk of low potassium -- takes potassium supplement      Current Medication: Outpatient Encounter Medications as of 12/10/2022  Medication Sig   furosemide (LASIX) 40 MG tablet Take 1 tablet (40 mg total) by mouth daily.   hydrochlorothiazide (HYDRODIURIL) 12.5 MG tablet Take 1 tablet (12.5 mg total) by mouth daily.   potassium chloride SA (KLOR-CON M) 20 MEQ tablet Take 1 tablet (20 mEq total) by mouth daily.   [DISCONTINUED] medroxyPROGESTERone (DEPO-PROVERA) 150 MG/ML injection Inject 1 mL (150 mg total) into the muscle every 3 (three) months. (Patient not taking: Reported on 04/30/2021)   No facility-administered encounter medications on file as of 12/10/2022.    Surgical History: Past Surgical History:  Procedure Laterality Date   COLONOSCOPY WITH PROPOFOL N/A 02/08/2021   Procedure: COLONOSCOPY WITH PROPOFOL;  Surgeon: Lin Landsman, MD;  Location: [facility] ENDOSCOPY;  Service: Gastroenterology;  Laterality: N/A;   EMBOLIZATION N/A 07/29/2021   Procedure: EMBOLIZATION;  Surgeon: Katha Cabal, MD;  Location: [facility] CV LAB;  Service: Cardiovascular;  Laterality: N/A;   EMBOLIZATION N/A 09/02/2021   Procedure: EMBOLIZATION;  Surgeon: Katha Cabal, MD;  Location: [facility] CV LAB;  Service: Cardiovascular;  Laterality: N/A;   Emobolization  09/02/2021   ESOPHAGOGASTRODUODENOSCOPY N/A 02/08/2021   Procedure: ESOPHAGOGASTRODUODENOSCOPY (EGD);  Surgeon: Lin Landsman, MD;  Location: [facility] ENDOSCOPY;  Service: Gastroenterology;   Laterality: N/A;   GIVENS CAPSULE STUDY N/A 02/09/2021   Procedure: GIVENS CAPSULE STUDY;  Surgeon: Lesly Rubenstein, MD;  Location: [facility] ENDOSCOPY;  Service: Endoscopy;  Laterality: N/A;    Medical History: Past Medical History:  Diagnosis Date   Anemia    CHF (congestive heart failure) ([facility])    Headache 06/17/2022   Hypertension     Family History: Family History  Problem Relation Age of Onset   Diabetes Mother    COPD Mother    Heart Problems Mother    Emphysema Father    Diabetes Maternal Aunt    Obesity Maternal Aunt    Arthritis Maternal Aunt    Hypercholesterolemia Brother    Breast cancer Neg Hx     Social History   Socioeconomic History   Marital status: Single    Spouse name: Not on file   Number of children: 1   Years of education: Not on file   Highest education level: Not on file  Occupational History   Not on file  Tobacco Use   Smoking status: Never   Smokeless tobacco: Never  Vaping Use   Vaping Use: Never used  Substance and Sexual Activity   Alcohol use: Not Currently    Comment: 6-7 months ago   Drug use: Not Currently    Types: Marijuana, Cocaine   Sexual activity: Not Currently  Other Topics Concern   Not on file  Social History Narrative   Lives with brother currently and niece    Social Determinants of Health   Financial Resource Strain: High Risk (07/16/2022)   Overall Financial Resource Strain (CARDIA)    Difficulty of Paying Living  Expenses: Very hard  Food Insecurity: No Food Insecurity (07/16/2022)   Hunger Vital Sign    Worried About Running Out of Food in the Last Year: Never true    Ran Out of Food in the Last Year: Never true  Transportation Needs: Unmet Transportation Needs (10/05/2022)   PRAPARE - Hydrologist (Medical): Yes    Lack of Transportation (Non-Medical): Yes  Physical Activity: Not on file  Stress: Not on file  Social Connections: Not on file  Intimate Partner Violence: Not  At Risk (06/17/2022)   Humiliation, Afraid, Rape, and Kick questionnaire    Fear of Current or Ex-Partner: No    Emotionally Abused: No    Physically Abused: No    Sexually Abused: No      Review of Systems  Constitutional:  Positive for fatigue. Negative for chills and unexpected weight change.  HENT:  Negative for congestion, postnasal drip, rhinorrhea, sneezing and sore throat.   Eyes:  Negative for redness.  Respiratory:  Negative for cough, chest tightness and shortness of breath.   Cardiovascular:  Positive for leg swelling (improving). Negative for chest pain and palpitations.  Gastrointestinal:  Negative for abdominal pain, constipation, diarrhea, nausea and vomiting.  Genitourinary:  Negative for dysuria and frequency.  Musculoskeletal:  Negative for arthralgias, back pain, joint swelling and neck pain.  Skin:  Negative for rash.  Neurological: Negative.  Negative for tremors and numbness.  Hematological:  Negative for adenopathy. Does not bruise/bleed easily.  Psychiatric/Behavioral:  Negative for behavioral problems (Depression), sleep disturbance and suicidal ideas. The patient is not nervous/anxious.     Vital Signs: BP 129/81   Pulse 64   Temp (!) 96.9 F (36.1 C)   Resp 16   Ht '5\' 6"'$  (1.676 m)   Wt (!) 324 lb 9.6 oz (147.2 kg)   SpO2 98%   BMI 52.39 kg/m    Physical Exam Vitals reviewed.  Constitutional:      General: She is not in acute distress.    Appearance: Normal appearance. She is obese. She is not ill-appearing.  HENT:     Head: Normocephalic and atraumatic.  Eyes:     Pupils: Pupils are equal, round, and reactive to light.  Cardiovascular:     Rate and Rhythm: Normal rate and regular rhythm.  Pulmonary:     Effort: Pulmonary effort is normal. No respiratory distress.  Musculoskeletal:     Right lower leg: 1+ Edema present.     Left lower leg: 1+ Edema present.  Neurological:     Mental Status: She is alert and oriented to person, place,  and time.  Psychiatric:        Mood and Affect: Mood normal.        Behavior: Behavior normal.        Assessment/Plan: 1. Essential hypertension Stable, improved, Continue medications as prescribed  2. Bilateral lower extremity edema Improving, continue furosemide and HCTZ as prescribed.   3. Drug-induced hypokalemia At risk of low potassium, continue to take potassium supplement as prescribed. Will repeat potassium level in 3 months    General Counseling: Rudean Curt understanding of the findings of todays visit and agrees with plan of treatment. I have discussed any further diagnostic evaluation that may be needed or ordered today. We also reviewed her medications today. she has been encouraged to call the office with any questions or concerns that should arise related to todays visit.    No orders of the defined  types were placed in this encounter.   No orders of the defined types were placed in this encounter.   Return in about 3 months (around 03/12/2023) for F/U, BP check, Audrea Bolte PCP and potassium check. .   Total time spent:30 Minutes Time spent includes review of chart, medications, test results, and follow up plan with the patient.   [place] Controlled Substance Database was reviewed by me.  This patient was seen by Jonetta Osgood, FNP-C in collaboration with Dr. Clayborn Bigness as a part of collaborative care agreement.   Maysoon Lozada R. Valetta Fuller, MSN, FNP-C Internal medicine

## 2022-12-14 ENCOUNTER — Encounter: Payer: Self-pay | Admitting: Internal Medicine

## 2022-12-25 IMAGING — MG MM DIGITAL SCREENING BILAT W/ TOMO AND CAD
6 of 12 series · 6 of 36 positions shown · non-contrast
Comparison: Previous exam(s).

CLINICAL DATA: Screening.

EXAM:
DIGITAL SCREENING BILATERAL MAMMOGRAM WITH TOMOSYNTHESIS AND CAD
TECHNIQUE: Bilateral screening digital craniocaudal and mediolateral oblique
mammograms were obtained. Bilateral screening digital breast
tomosynthesis was performed. The images were evaluated with
computer-aided detection.

[R CC synth-2D (1 of 2)]
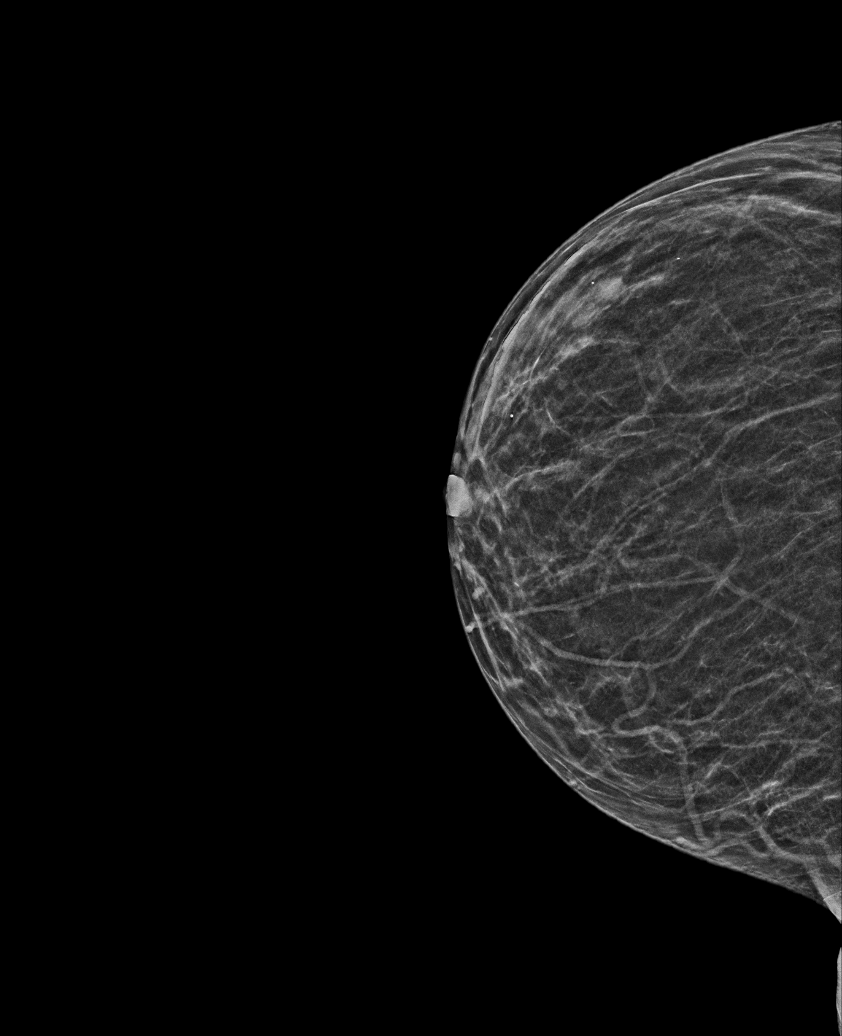

[R MLO synth-2D]
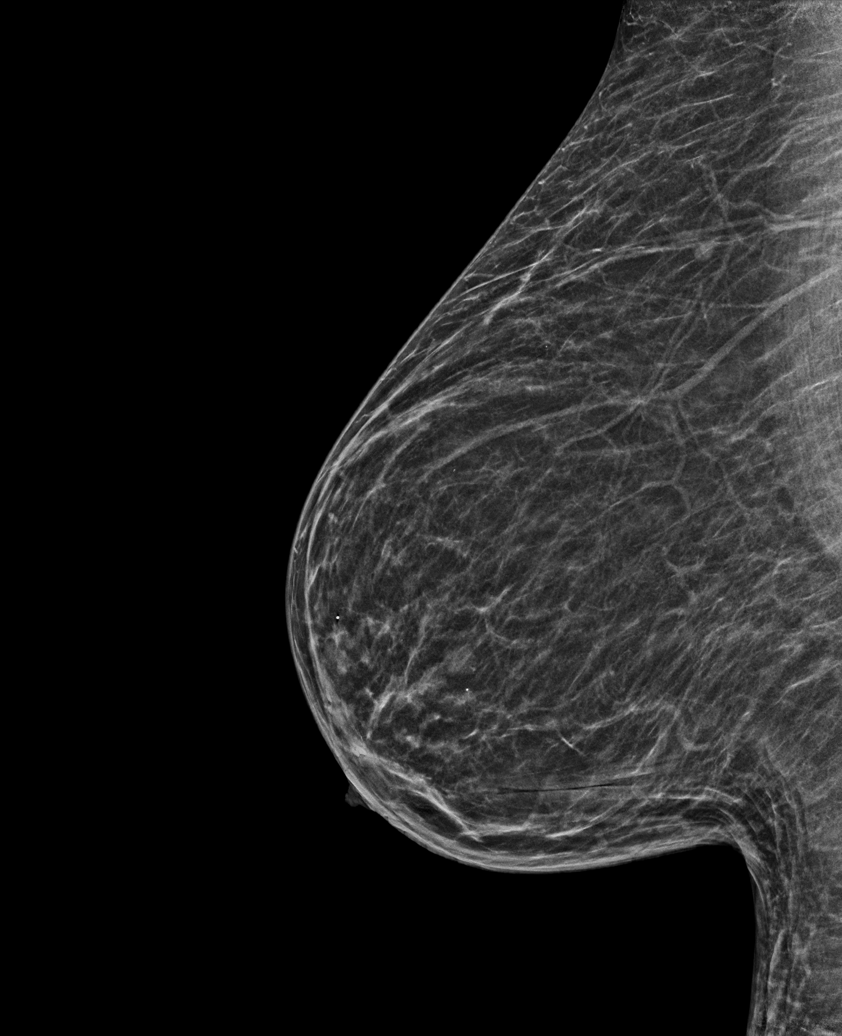

[L MLO synth-2D]
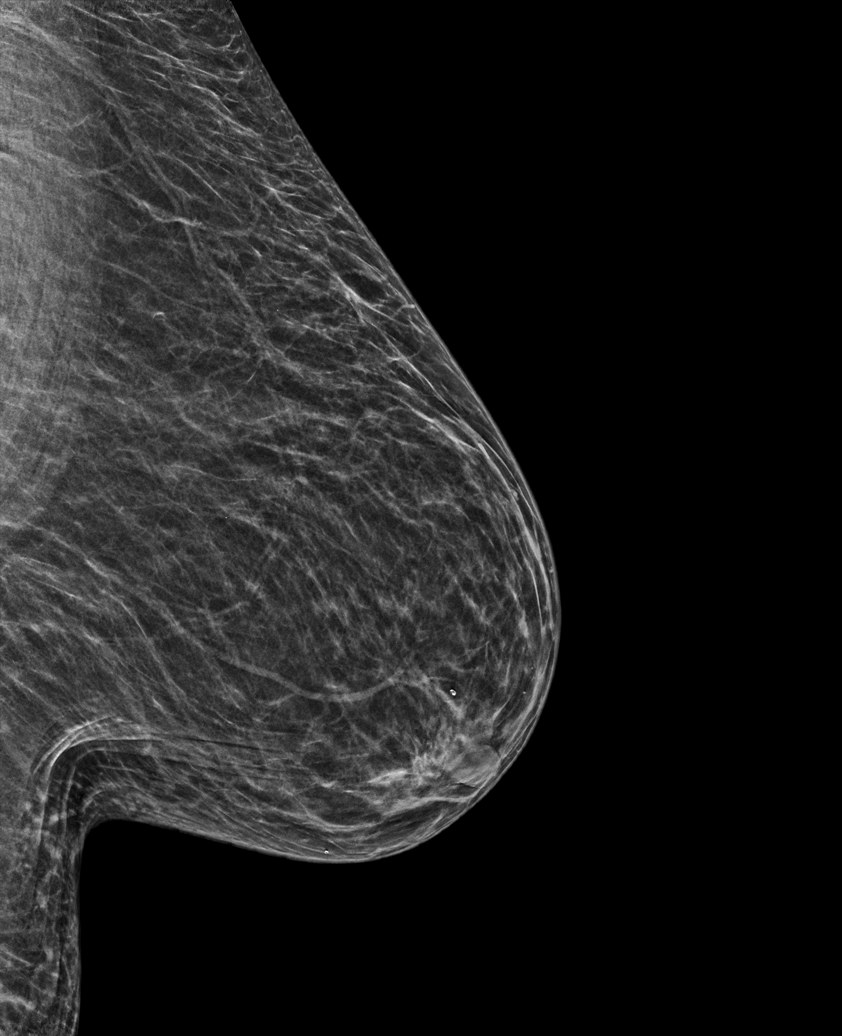

[L CC synth-2D (1 of 2)]
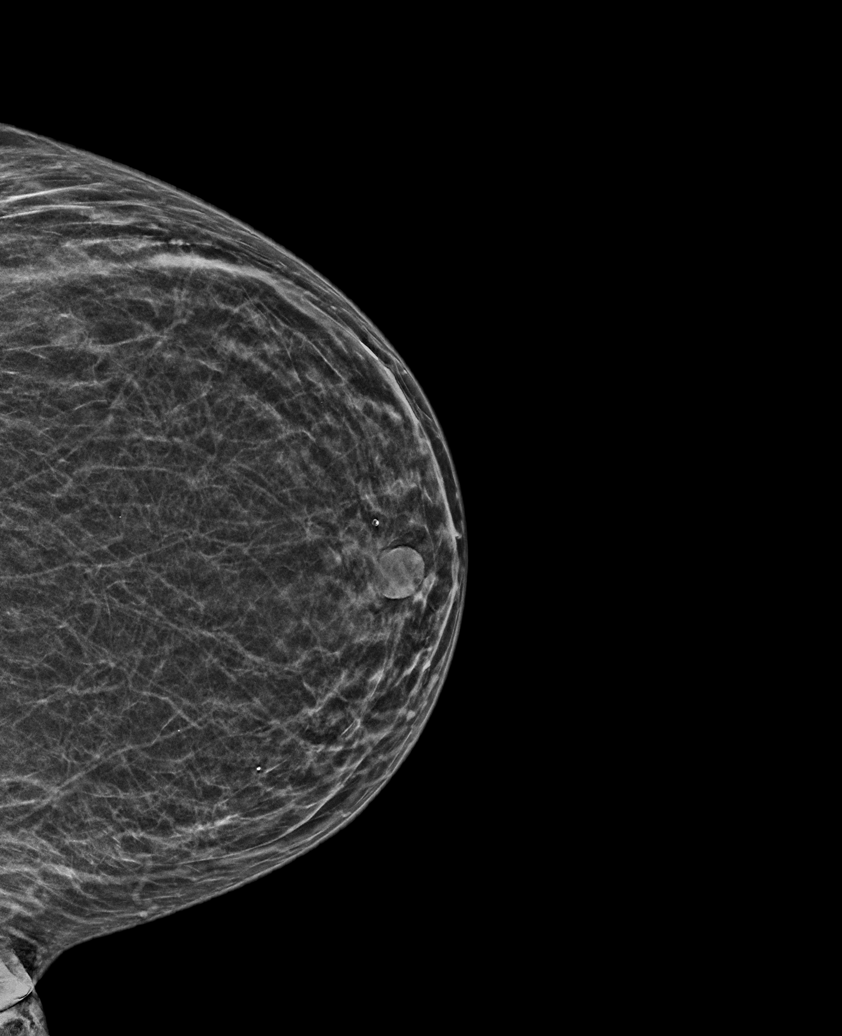

[R CC synth-2D (2 of 2)]
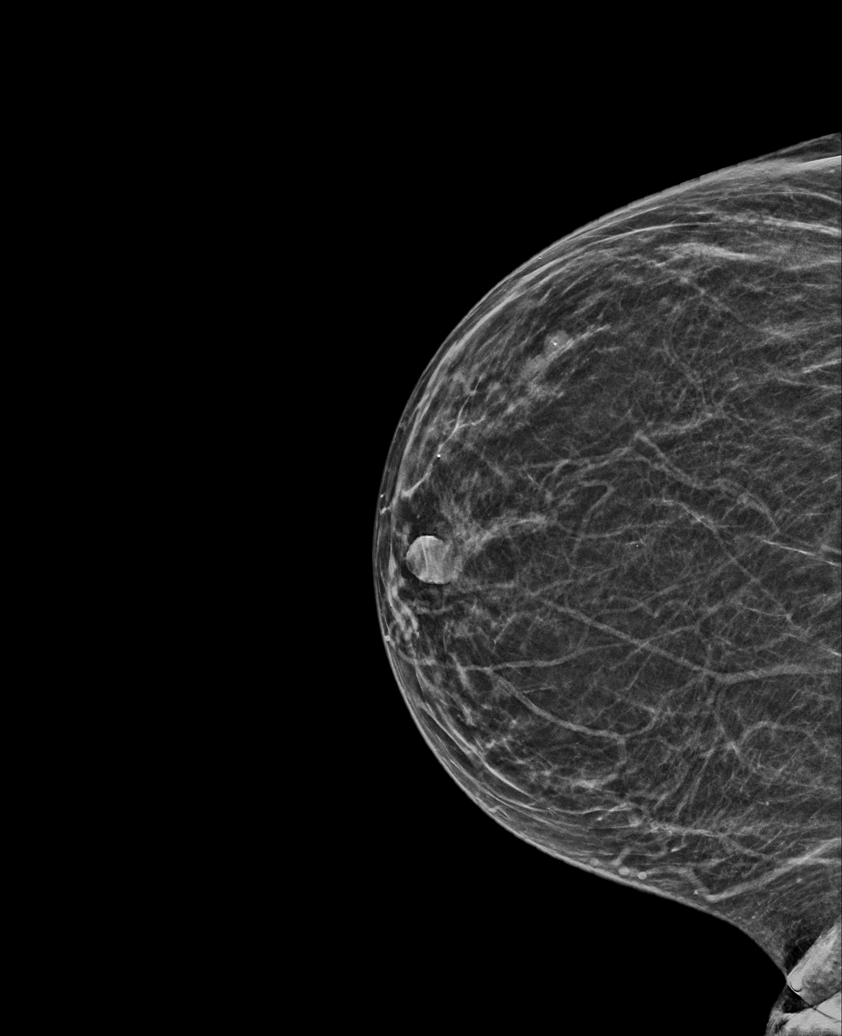

[L CC synth-2D (2 of 2)]
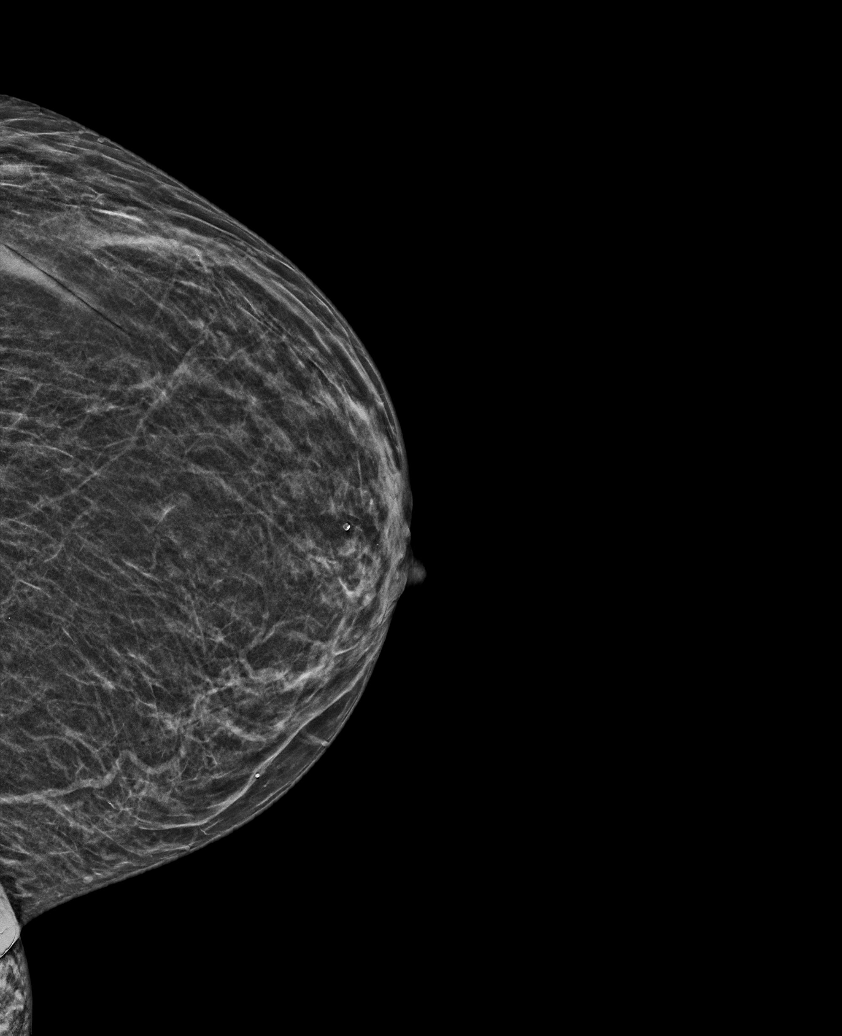

[6 of 36 positions shown; findings below may reference images not displayed]

ACR Breast Density Category b: There are scattered areas of
fibroglandular density.
FINDINGS: There are no findings suspicious for malignancy.
IMPRESSION: No mammographic evidence of malignancy. A result letter of this
screening mammogram will be mailed directly to the patient.

RECOMMENDATION:
Screening mammogram in one year. (Code:51-O-LD2)

BI-RADS CATEGORY  1: Negative.

## 2023-02-02 MED FILL — Iron Sucrose Inj 20 MG/ML (Fe Equiv): INTRAVENOUS | Qty: 10 | Status: AC

## 2023-02-03 ENCOUNTER — Encounter: Payer: Self-pay | Admitting: Internal Medicine

## 2023-02-03 ENCOUNTER — Inpatient Hospital Stay (HOSPITAL_BASED_OUTPATIENT_CLINIC_OR_DEPARTMENT_OTHER): Payer: Medicaid Other | Admitting: Internal Medicine

## 2023-02-03 ENCOUNTER — Inpatient Hospital Stay: Payer: Medicaid Other

## 2023-02-03 ENCOUNTER — Inpatient Hospital Stay: Payer: Medicaid Other | Attending: Internal Medicine

## 2023-02-03 DIAGNOSIS — R5383 Other fatigue: Secondary | ICD-10-CM | POA: Insufficient documentation

## 2023-02-03 DIAGNOSIS — D500 Iron deficiency anemia secondary to blood loss (chronic): Secondary | ICD-10-CM

## 2023-02-03 DIAGNOSIS — N920 Excessive and frequent menstruation with regular cycle: Secondary | ICD-10-CM | POA: Insufficient documentation

## 2023-02-03 LAB — CBC WITH DIFFERENTIAL/PLATELET
Abs Immature Granulocytes: 0 10*3/uL (ref 0.00–0.07)
Basophils Absolute: 0.1 10*3/uL (ref 0.0–0.1)
Basophils Relative: 1 %
Eosinophils Absolute: 0 10*3/uL (ref 0.0–0.5)
Eosinophils Relative: 1 %
HCT: 39.1 % (ref 36.0–46.0)
Hemoglobin: 12.1 g/dL (ref 12.0–15.0)
Immature Granulocytes: 0 %
Lymphocytes Relative: 39 %
Lymphs Abs: 1.7 10*3/uL (ref 0.7–4.0)
MCH: 22.4 pg — ABNORMAL LOW (ref 26.0–34.0)
MCHC: 30.9 g/dL (ref 30.0–36.0)
MCV: 72.4 fL — ABNORMAL LOW (ref 80.0–100.0)
Monocytes Absolute: 0.3 10*3/uL (ref 0.1–1.0)
Monocytes Relative: 7 %
Neutro Abs: 2.3 10*3/uL (ref 1.7–7.7)
Neutrophils Relative %: 52 %
Platelets: 176 10*3/uL (ref 150–400)
RBC: 5.4 MIL/uL — ABNORMAL HIGH (ref 3.87–5.11)
RDW: 16.2 % — ABNORMAL HIGH (ref 11.5–15.5)
WBC: 4.5 10*3/uL (ref 4.0–10.5)
nRBC: 0 % (ref 0.0–0.2)

## 2023-02-03 LAB — IRON AND TIBC
Iron: 83 ug/dL (ref 28–170)
Saturation Ratios: 21 % (ref 10.4–31.8)
TIBC: 392 ug/dL (ref 250–450)
UIBC: 309 ug/dL

## 2023-02-03 LAB — BASIC METABOLIC PANEL
Anion gap: 11 (ref 5–15)
BUN: 8 mg/dL (ref 6–20)
CO2: 23 mmol/L (ref 22–32)
Calcium: 9.1 mg/dL (ref 8.9–10.3)
Chloride: 100 mmol/L (ref 98–111)
Creatinine, Ser: 0.73 mg/dL (ref 0.44–1.00)
GFR, Estimated: >60 mL/min (ref >60)
Glucose, Bld: 75 mg/dL (ref 70–99)
Potassium: 3.3 mmol/L — ABNORMAL LOW (ref 3.5–5.1)
Sodium: 134 mmol/L — ABNORMAL LOW (ref 135–145)

## 2023-02-03 LAB — FERRITIN: Ferritin: 11 ng/mL (ref 11–307)

## 2023-02-03 NOTE — Progress Notes (Signed)
Fatigue/weakness: no Dyspena: no Light headedness: no Blood in stool:  no 

## 2023-02-03 NOTE — Progress Notes (Signed)
[facility] OFFICE PROGRESS NOTE  Patient Care Team: Sallyanne Kuster, NP as PCP - General (Nurse Practitioner) Jim Like, RN as Registered Nurse Scarlett Presto, RN (Inactive) as Registered Nurse Earna Coder, MD as Consulting Physician (Oncology)   Cancer Staging  No matching staging information was found for the patient.   Oncology History   No history exists.   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   #Heavy menstrual cycles-fibroids [Dr Shuman]; DEC 2022- s/p uterine embolization.    INTERVAL HISTORY: Ambulating independently.  Alone.  Lauren Hensley 57 y.o.  female pleasant patient history of iron deficient anemia/beta thalassemia-other history of fibroids/heavy menstrual cycle currently status post ablation is here for follow-up.  Patient denies any worsening fatigue.  Denies any shortness of breath or cough.  States to have regular menstrual cycles.  However not heavy.  Patient not taking oral iron; noncompliant.  Review of Systems  Constitutional:  Positive for malaise/fatigue. Negative for chills, diaphoresis, fever and weight loss.  HENT:  Negative for nosebleeds and sore throat.   Eyes:  Negative for double vision.  Respiratory:  Negative for cough, hemoptysis, sputum production, shortness of breath and wheezing.   Cardiovascular:  Negative for chest pain, palpitations, orthopnea and leg swelling.  Gastrointestinal:  Negative for abdominal pain, blood in stool, constipation, diarrhea, heartburn, melena, nausea and vomiting.  Genitourinary:  Negative for dysuria, frequency and urgency.  Musculoskeletal:  Positive for joint pain. Negative for back pain.  Skin: Negative.  Negative for itching and rash.  Neurological:  Positive for dizziness. Negative for tingling, focal weakness, weakness and headaches.  Endo/Heme/Allergies:  Does not bruise/bleed easily.   Psychiatric/Behavioral:  Negative for depression. The patient is not nervous/anxious and does not have insomnia.       PAST MEDICAL HISTORY :  Past Medical History:  Diagnosis Date   Anemia    CHF (congestive heart failure) ([facility])    Headache 06/17/2022   Hypertension     PAST SURGICAL HISTORY :   Past Surgical History:  Procedure Laterality Date   COLONOSCOPY WITH PROPOFOL N/A 02/08/2021   Procedure: COLONOSCOPY WITH PROPOFOL;  Surgeon: Toney Reil, MD;  Location: [facility] ENDOSCOPY;  Service: Gastroenterology;  Laterality: N/A;   EMBOLIZATION N/A 07/29/2021   Procedure: EMBOLIZATION;  Surgeon: Renford Dills, MD;  Location: [facility] INVASIVE CV LAB;  Service: Cardiovascular;  Laterality: N/A;   EMBOLIZATION N/A 09/02/2021   Procedure: EMBOLIZATION;  Surgeon: Renford Dills, MD;  Location: [facility] INVASIVE CV LAB;  Service: Cardiovascular;  Laterality: N/A;   Emobolization  09/02/2021   ESOPHAGOGASTRODUODENOSCOPY N/A 02/08/2021   Procedure: ESOPHAGOGASTRODUODENOSCOPY (EGD);  Surgeon: Toney Reil, MD;  Location: [facility] ENDOSCOPY;  Service: Gastroenterology;  Laterality: N/A;   GIVENS CAPSULE STUDY N/A 02/09/2021   Procedure: GIVENS CAPSULE STUDY;  Surgeon: Regis Bill, MD;  Location: [facility] ENDOSCOPY;  Service: Endoscopy;  Laterality: N/A;    FAMILY HISTORY :   Family History  Problem Relation Age of Onset   Diabetes Mother    COPD Mother    Heart Problems Mother    Emphysema Father    Diabetes Maternal Aunt    Obesity Maternal Aunt    Arthritis Maternal Aunt    Hypercholesterolemia Brother    Breast cancer Neg Hx     SOCIAL HISTORY:   Social History   Tobacco Use   Smoking status: Never   Smokeless tobacco: Never  Vaping Use   Vaping  Use: Never used  Substance Use Topics   Alcohol use: Not Currently    Comment: 6-7 months ago   Drug use: Not Currently    Types: Marijuana, Cocaine    ALLERGIES:  has No Known Allergies.  MEDICATIONS:   Current Outpatient Medications  Medication Sig Dispense Refill   furosemide (LASIX) 40 MG tablet Take 1 tablet (40 mg total) by mouth daily. 30 tablet 3   hydrochlorothiazide (HYDRODIURIL) 12.5 MG tablet Take 1 tablet (12.5 mg total) by mouth daily. 90 tablet 3   potassium chloride SA (KLOR-CON M) 20 MEQ tablet Take 1 tablet (20 mEq total) by mouth daily. 30 tablet 2   No current facility-administered medications for this visit.    PHYSICAL EXAMINATION:  BP (!) 148/75 (BP Location: Left Arm, Patient Position: Sitting, Cuff Size: Large)   Pulse 98   Temp (!) 95.7 F (35.4 C) (Tympanic)   Ht 5\' 6"  (1.676 m)   Wt (!) 337 lb (152.9 kg)   SpO2 100%   BMI 54.39 kg/m   Filed Weights   02/03/23 1305  Weight: (!) 337 lb (152.9 kg)     Physical Exam Vitals and nursing note reviewed.  HENT:     Head: Normocephalic and atraumatic.     Mouth/Throat:     Pharynx: Oropharynx is clear.  Eyes:     Extraocular Movements: Extraocular movements intact.     Pupils: Pupils are equal, round, and reactive to light.  Cardiovascular:     Rate and Rhythm: Normal rate and regular rhythm.  Pulmonary:     Comments: Decreased breath sounds bilaterally.  Abdominal:     Palpations: Abdomen is soft.  Musculoskeletal:        General: Normal range of motion.     Cervical back: Normal range of motion.  Skin:    General: Skin is warm.  Neurological:     General: No focal deficit present.     Mental Status: She is alert and oriented to person, place, and time.  Psychiatric:        Behavior: Behavior normal.        Judgment: Judgment normal.        LABORATORY DATA:  I have reviewed the data as listed    Component Value Date/Time   NA 134 (L) 02/03/2023 1308   K 3.3 (L) 02/03/2023 1308   CL 100 02/03/2023 1308   CO2 23 02/03/2023 1308   GLUCOSE 75 02/03/2023 1308   BUN 8 02/03/2023 1308   CREATININE 0.73 02/03/2023 1308   CALCIUM 9.1 02/03/2023 1308   PROT 7.2 06/16/2022 1436    ALBUMIN 3.6 06/16/2022 1436   AST 25 06/16/2022 1436   ALT 12 06/16/2022 1436   ALKPHOS 34 (L) 06/16/2022 1436   BILITOT 0.7 06/16/2022 1436   GFRNONAA >60 02/03/2023 1308    No results found for: "SPEP", "UPEP"  Lab Results  Component Value Date   WBC 4.5 02/03/2023   NEUTROABS 2.3 02/03/2023   HGB 12.1 02/03/2023   HCT 39.1 02/03/2023   MCV 72.4 (L) 02/03/2023   PLT 176 02/03/2023      Chemistry      Component Value Date/Time   NA 134 (L) 02/03/2023 1308   K 3.3 (L) 02/03/2023 1308   CL 100 02/03/2023 1308   CO2 23 02/03/2023 1308   BUN 8 02/03/2023 1308   CREATININE 0.73 02/03/2023 1308      Component Value Date/Time   CALCIUM 9.1 02/03/2023 1308  ALKPHOS 34 (L) 06/16/2022 1436   AST 25 06/16/2022 1436   ALT 12 06/16/2022 1436   BILITOT 0.7 06/16/2022 1436       RADIOGRAPHIC STUDIES: I have personally reviewed the radiological images as listed and agreed with the findings in the report. No results found.   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 S/p IV Venofer.    #  Today hemoglobin is 12.2- HOLD venofer today.  Non-compliant with PO iron. recommend compliance with oral pills; especially given poor IV access.    # Menorrhagia-fibroids s/p gynecology evaluation. S/p partial embolization left & right uterine artery  [Dr.Dew]-DC 2022]- stable.    # IV access: POOR discussed continue hydration.  We will have to consider either port placement if patient needs ongoing labs/infusions.  Patient understands need for hydration prior to IV access.  needs transportation # DISPOSITION # HOLD venofer today  # follow up in 6 months- MD; labs- cbc;bmp; iron studies;ferritin; possible- venofer- Dr.B    Orders Placed This Encounter  Procedures   CBC with Differential (Cancer Center Only)     Standing Status:   Future    Standing Expiration Date:   02/03/2024   Basic Metabolic Panel - Cancer Center Only    Standing Status:   Future    Standing Expiration Date:   02/03/2024   Iron and TIBC    Standing Status:   Future    Standing Expiration Date:   02/03/2024   Ferritin    Standing Status:   Future    Standing Expiration Date:   02/03/2024   All questions were answered. The patient knows to call the clinic with any problems, questions or concerns.      Earna Coder, MD 02/03/2023 1:58 PM

## 2023-02-03 NOTE — Assessment & Plan Note (Addendum)
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 S/p IV Venofer.    #  Today hemoglobin is 12.2- HOLD venofer today.  Non-compliant with PO iron. recommend compliance with oral pills; especially given poor IV access.    # Menorrhagia-fibroids s/p gynecology evaluation. S/p partial embolization left & right uterine artery  [Dr.Dew]-DC 2022]- stable.    # IV access: POOR discussed continue hydration.  We will have to consider either port placement if patient needs ongoing labs/infusions.  Patient understands need for hydration prior to IV access.  needs transportation # DISPOSITION # HOLD venofer today  # follow up in 6 months- MD; labs- cbc;bmp; iron studies;ferritin; possible- venofer- Dr.B

## 2023-03-12 ENCOUNTER — Ambulatory Visit: Payer: Medicaid Other | Admitting: Nurse Practitioner

## 2023-03-17 ENCOUNTER — Other Ambulatory Visit: Payer: Self-pay

## 2023-03-17 ENCOUNTER — Encounter: Payer: Self-pay | Admitting: Nurse Practitioner

## 2023-03-17 ENCOUNTER — Telehealth: Payer: Self-pay | Admitting: Nurse Practitioner

## 2023-03-17 ENCOUNTER — Ambulatory Visit (INDEPENDENT_AMBULATORY_CARE_PROVIDER_SITE_OTHER): Payer: Medicaid Other | Admitting: Nurse Practitioner

## 2023-03-17 VITALS — BP 130/60 | HR 65 | Temp 98.1°F | Resp 16 | Ht 66.0 in | Wt 336.8 lb

## 2023-03-17 DIAGNOSIS — I890 Lymphedema, not elsewhere classified: Secondary | ICD-10-CM | POA: Diagnosis not present

## 2023-03-17 DIAGNOSIS — E669 Obesity, unspecified: Secondary | ICD-10-CM

## 2023-03-17 DIAGNOSIS — I509 Heart failure, unspecified: Secondary | ICD-10-CM | POA: Diagnosis not present

## 2023-03-17 DIAGNOSIS — Z23 Encounter for immunization: Secondary | ICD-10-CM

## 2023-03-17 MED ORDER — TRIAMTERENE-HCTZ 37.5-25 MG PO TABS
1.0000 | ORAL_TABLET | Freq: Every day | ORAL | 2 refills | Status: DC
Start: 2023-03-17 — End: 2023-06-22
  Filled 2023-03-17: qty 30, 30d supply, fill #0
  Filled 2023-04-14: qty 30, 30d supply, fill #1
  Filled 2023-05-16: qty 30, 30d supply, fill #2

## 2023-03-17 MED ORDER — TETANUS-DIPHTH-ACELL PERTUSSIS 5-2.5-18.5 LF-MCG/0.5 IM SUSP
0.5000 mL | Freq: Once | INTRAMUSCULAR | 0 refills | Status: AC
Start: 2023-03-17 — End: 2023-03-18
  Filled 2023-03-17: qty 0.5, 1d supply, fill #0

## 2023-03-17 MED ORDER — POTASSIUM CHLORIDE CRYS ER 20 MEQ PO TBCR
20.0000 meq | EXTENDED_RELEASE_TABLET | Freq: Every day | ORAL | 2 refills | Status: DC
Start: 2023-03-17 — End: 2023-06-22
  Filled 2023-03-17: qty 30, 30d supply, fill #0
  Filled 2023-04-14: qty 30, 30d supply, fill #1
  Filled 2023-05-16: qty 30, 30d supply, fill #2

## 2023-03-17 MED ORDER — ZOSTER VAC RECOMB ADJUVANTED 50 MCG/0.5ML IM SUSR
0.5000 mL | Freq: Once | INTRAMUSCULAR | 0 refills | Status: AC
Start: 2023-03-17 — End: 2023-03-18
  Filled 2023-03-17: qty 0.5, 1d supply, fill #0

## 2023-03-17 NOTE — Telephone Encounter (Signed)
Awaiting 03/17/23 office notes for Physical Therapy referral-Toni

## 2023-03-17 NOTE — Patient Instructions (Signed)
STOP furosemide  STOP hydrochlorothiazide  START triamterene-hydrochlorothiazide   CONTINUE potassium supplement.

## 2023-03-17 NOTE — Progress Notes (Signed)
[facility] [address] [place], [place] [address]  Internal MEDICINE  Office Visit Note  Patient Name: Lauren Hensley  [id number]  [id number]  Date of Service: 03/17/2023  Chief Complaint  Patient presents with   Hypertension   Follow-up    HPI Lauren Hensley presents for a follow-up visit for lower extremity edema and low potassium Still having swelling in both lower extremities. Current on lasix and hydrochlorothiazide. No significant improvement with medication or elevation of extremities. Feet are even more swollen.  Hypokalemia -- need refill of potassium supplement.  Due for tetanus and shingles vaccines    Current Medication: Outpatient Encounter Medications as of 03/17/2023  Medication Sig   triamterene-hydrochlorothiazide (MAXZIDE-25) 37.5-25 MG tablet Take 1 tablet by mouth daily.   [DISCONTINUED] furosemide (LASIX) 40 MG tablet Take 1 tablet (40 mg total) by mouth daily.   [DISCONTINUED] hydrochlorothiazide (HYDRODIURIL) 12.5 MG tablet Take 1 tablet (12.5 mg total) by mouth daily.   [DISCONTINUED] potassium chloride SA (KLOR-CON M) 20 MEQ tablet Take 1 tablet (20 mEq total) by mouth daily.   [DISCONTINUED] Tdap (BOOSTRIX) 5-2.5-18.5 LF-MCG/0.5 injection Inject 0.5 mLs into the muscle once.   [DISCONTINUED] Zoster Vaccine Adjuvanted [facility]) injection Inject 0.5 mLs into the muscle once.   potassium chloride SA (KLOR-CON M) 20 MEQ tablet Take 1 tablet (20 mEq total) by mouth daily.   Tdap (BOOSTRIX) 5-2.5-18.5 LF-MCG/0.5 injection Inject 0.5 mLs into the muscle once for 1 dose.   Zoster Vaccine Adjuvanted [facility]) injection Inject 0.5 mLs into the muscle once for 1 dose.   [DISCONTINUED] medroxyPROGESTERone (DEPO-PROVERA) 150 MG/ML injection Inject 1 mL (150 mg total) into the muscle every 3 (three) months. (Patient not taking: Reported on 04/30/2021)   No facility-administered encounter medications on file as of 03/17/2023.    Surgical History: Past Surgical  History:  Procedure Laterality Date   COLONOSCOPY WITH PROPOFOL N/A 02/08/2021   Procedure: COLONOSCOPY WITH PROPOFOL;  Surgeon: Toney Reil, MD;  Location: [facility] ENDOSCOPY;  Service: Gastroenterology;  Laterality: N/A;   EMBOLIZATION N/A 07/29/2021   Procedure: EMBOLIZATION;  Surgeon: Renford Dills, MD;  Location: [facility] INVASIVE CV LAB;  Service: Cardiovascular;  Laterality: N/A;   EMBOLIZATION N/A 09/02/2021   Procedure: EMBOLIZATION;  Surgeon: Renford Dills, MD;  Location: [facility] INVASIVE CV LAB;  Service: Cardiovascular;  Laterality: N/A;   Emobolization  09/02/2021   ESOPHAGOGASTRODUODENOSCOPY N/A 02/08/2021   Procedure: ESOPHAGOGASTRODUODENOSCOPY (EGD);  Surgeon: Toney Reil, MD;  Location: [facility] ENDOSCOPY;  Service: Gastroenterology;  Laterality: N/A;   GIVENS CAPSULE STUDY N/A 02/09/2021   Procedure: GIVENS CAPSULE STUDY;  Surgeon: Regis Bill, MD;  Location: [facility] ENDOSCOPY;  Service: Endoscopy;  Laterality: N/A;    Medical History: Past Medical History:  Diagnosis Date   Anemia    CHF (congestive heart failure) (HCC)    Headache 06/17/2022   Hypertension     Family History: Family History  Problem Relation Age of Onset   Diabetes Mother    COPD Mother    Heart Problems Mother    Emphysema Father    Diabetes Maternal Aunt    Obesity Maternal Aunt    Arthritis Maternal Aunt    Hypercholesterolemia Brother    Breast cancer Neg Hx     Social History   Socioeconomic History   Marital status: Single    Spouse name: Not on file   Number of children: 1   Years of education: Not on file   Highest education level: Not on file  Occupational History   Not on file  Tobacco Use   Smoking status: Never   Smokeless tobacco: Never  Vaping Use   Vaping Use: Never used  Substance and Sexual Activity   Alcohol use: Not Currently    Comment: 6-7 months ago   Drug use: Not Currently    Types: Marijuana, Cocaine   Sexual activity: Not Currently   Other Topics Concern   Not on file  Social History Narrative   Lives with brother currently and niece    Social Determinants of Health   Financial Resource Strain: High Risk (07/16/2022)   Overall Financial Resource Strain (CARDIA)    Difficulty of Paying Living Expenses: Very hard  Food Insecurity: No Food Insecurity (07/16/2022)   Hunger Vital Sign    Worried About Running Out of Food in the Last Year: Never true    Ran Out of Food in the Last Year: Never true  Transportation Needs: Unmet Transportation Needs (02/03/2023)   PRAPARE - Administrator, Civil Service (Medical): Yes    Lack of Transportation (Non-Medical): Yes  Physical Activity: Not on file  Stress: Not on file  Social Connections: Not on file  Intimate Partner Violence: Not At Risk (06/17/2022)   Humiliation, Afraid, Rape, and Kick questionnaire    Fear of Current or Ex-Partner: No    Emotionally Abused: No    Physically Abused: No    Sexually Abused: No      Review of Systems  Constitutional:  Positive for fatigue. Negative for chills and unexpected weight change.  HENT:  Negative for congestion, postnasal drip, rhinorrhea, sneezing and sore throat.   Eyes:  Negative for redness.  Respiratory:  Negative for cough, chest tightness and shortness of breath.   Cardiovascular:  Positive for leg swelling (improving). Negative for chest pain and palpitations.  Gastrointestinal:  Negative for abdominal pain, constipation, diarrhea, nausea and vomiting.  Genitourinary:  Negative for dysuria and frequency.  Musculoskeletal:  Negative for arthralgias, back pain, joint swelling and neck pain.  Skin:  Negative for rash.  Neurological:  Positive for numbness (and tingling in feet). Negative for tremors.  Hematological:  Negative for adenopathy. Does not bruise/bleed easily.  Psychiatric/Behavioral:  Negative for behavioral problems (Depression), sleep disturbance and suicidal ideas. The patient is not  nervous/anxious.     Vital Signs: BP 130/60 Comment: 160/90  Pulse 65   Temp 98.1 F (36.7 C)   Resp 16   Ht 5\' 6"  (1.676 m)   Wt (!) 336 lb 12.8 oz (152.8 kg)   SpO2 98%   BMI 54.36 kg/m    Physical Exam Vitals reviewed.  Constitutional:      General: She is not in acute distress.    Appearance: Normal appearance. She is obese. She is not ill-appearing.  HENT:     Head: Normocephalic and atraumatic.  Eyes:     Pupils: Pupils are equal, round, and reactive to light.  Cardiovascular:     Rate and Rhythm: Normal rate and regular rhythm.  Pulmonary:     Effort: Pulmonary effort is normal. No respiratory distress.  Musculoskeletal:     Right lower leg: 2+ Pitting Edema present.     Left lower leg: 2+ Pitting Edema present.  Neurological:     Mental Status: She is alert and oriented to person, place, and time.  Psychiatric:        Mood and Affect: Mood normal.        Behavior: Behavior normal.  Assessment/Plan: 1. Lymphedema associated with obesity Refer to lymphedema clinic. Diuretic switched, discontinue furosemide and hydrochlorothiazide. Start triamterene-hydrochlorothiazide tomorrow - Ambulatory referral to Physical Therapy - triamterene-hydrochlorothiazide (MAXZIDE-25) 37.5-25 MG tablet; Take 1 tablet by mouth daily.  Dispense: 30 tablet; Refill: 2  2. Chronic congestive heart failure, unspecified heart failure type (HCC) Continue potassium supplement and diuretic  - potassium chloride SA (KLOR-CON M) 20 MEQ tablet; Take 1 tablet (20 mEq total) by mouth daily.  Dispense: 30 tablet; Refill: 2 - triamterene-hydrochlorothiazide (MAXZIDE-25) 37.5-25 MG tablet; Take 1 tablet by mouth daily.  Dispense: 30 tablet; Refill: 2  3. Need for vaccination - Tdap (BOOSTRIX) 5-2.5-18.5 LF-MCG/0.5 injection; Inject 0.5 mLs into the muscle once for 1 dose.  Dispense: 0.5 mL; Refill: 0 - Zoster Vaccine Adjuvanted [facility]) injection; Inject 0.5 mLs into the muscle once  for 1 dose.  Dispense: 0.5 mL; Refill: 0   General Counseling: Lauren Hensley verbalizes understanding of the findings of todays visit and agrees with plan of treatment. I have discussed any further diagnostic evaluation that may be needed or ordered today. We also reviewed her medications today. she has been encouraged to call the office with any questions or concerns that should arise related to todays visit.    Orders Placed This Encounter  Procedures   Ambulatory referral to Physical Therapy    Meds ordered this encounter  Medications   Tdap (BOOSTRIX) 5-2.5-18.5 LF-MCG/0.5 injection    Sig: Inject 0.5 mLs into the muscle once for 1 dose.    Dispense:  0.5 mL    Refill:  0   Zoster Vaccine Adjuvanted [facility]) injection    Sig: Inject 0.5 mLs into the muscle once for 1 dose.    Dispense:  0.5 mL    Refill:  0   potassium chloride SA (KLOR-CON M) 20 MEQ tablet    Sig: Take 1 tablet (20 mEq total) by mouth daily.    Dispense:  30 tablet    Refill:  2   triamterene-hydrochlorothiazide (MAXZIDE-25) 37.5-25 MG tablet    Sig: Take 1 tablet by mouth daily.    Dispense:  30 tablet    Refill:  2    Discontinue furosemide and hydrochlorothiazide. And start new medication tomorrow.    Return for F/U, Lauren Hensley PCP, eval new med.   Total time spent:30 Minutes Time spent includes review of chart, medications, test results, and follow up plan with the patient.   [place] Controlled Substance Database was reviewed by me.  This patient was seen by Sallyanne Kuster, FNP-C in collaboration with Dr. Beverely Risen as a part of collaborative care agreement.   Jonathyn Carothers R. Tedd Sias, MSN, FNP-C Internal medicine

## 2023-03-18 ENCOUNTER — Telehealth: Payer: Self-pay | Admitting: Nurse Practitioner

## 2023-03-18 NOTE — Telephone Encounter (Signed)
Urgent PT referral faxed to [facility]; [phone]

## 2023-04-14 ENCOUNTER — Ambulatory Visit (INDEPENDENT_AMBULATORY_CARE_PROVIDER_SITE_OTHER): Payer: Medicaid Other | Admitting: Nurse Practitioner

## 2023-04-14 ENCOUNTER — Encounter: Payer: Self-pay | Admitting: Nurse Practitioner

## 2023-04-14 ENCOUNTER — Other Ambulatory Visit: Payer: Self-pay

## 2023-04-14 VITALS — BP 110/65 | HR 66 | Temp 97.5°F | Resp 16 | Ht 66.0 in | Wt 323.6 lb

## 2023-04-14 DIAGNOSIS — R600 Localized edema: Secondary | ICD-10-CM | POA: Diagnosis not present

## 2023-04-14 DIAGNOSIS — I480 Paroxysmal atrial fibrillation: Secondary | ICD-10-CM

## 2023-04-14 DIAGNOSIS — E669 Obesity, unspecified: Secondary | ICD-10-CM

## 2023-04-14 DIAGNOSIS — I890 Lymphedema, not elsewhere classified: Secondary | ICD-10-CM | POA: Diagnosis not present

## 2023-04-14 DIAGNOSIS — R0789 Other chest pain: Secondary | ICD-10-CM

## 2023-04-14 DIAGNOSIS — D649 Anemia, unspecified: Secondary | ICD-10-CM

## 2023-04-14 DIAGNOSIS — E876 Hypokalemia: Secondary | ICD-10-CM

## 2023-04-14 MED ORDER — APIXABAN 5 MG PO TABS
5.0000 mg | ORAL_TABLET | Freq: Two times a day (BID) | ORAL | 3 refills | Status: DC
  Filled 2023-04-14: qty 60, 30d supply, fill #0
  Filled 2023-06-15: qty 60, 30d supply, fill #1
  Filled 2023-07-13: qty 60, 30d supply, fill #2

## 2023-04-14 MED ORDER — METOPROLOL SUCCINATE ER 25 MG PO TB24
25.0000 mg | ORAL_TABLET | Freq: Every day | ORAL | 3 refills | Status: DC
  Filled 2023-04-14: qty 30, 30d supply, fill #0
  Filled 2023-05-16: qty 30, 30d supply, fill #1
  Filled 2023-06-15: qty 30, 30d supply, fill #2
  Filled 2023-07-13: qty 30, 30d supply, fill #3

## 2023-04-14 NOTE — Progress Notes (Signed)
[facility] [address] [place], [place] [address]  Internal MEDICINE  Office Visit Note  Patient Name: Lauren Hensley  [id number]  [id number]  Date of Service: 04/14/2023  Chief Complaint  Patient presents with   Hypertension   Follow-up    Eval new med.     HPI Lauren Hensley presents for a follow-up visit for hypertension, edema, potassium, anemia, and chest pain.  Hypertension -- elevated but overall improved from previous.  Edema -- still swollen some but much improved.  Potassium --need to recheck the level  Anemia -- starting to get more SOB, sees hematology next month.  Chest pain -- EKG done in office showing atrial fibrillation. Confirmed by auscultation with rate fluctuation but not overly rapid. This may also be the source for the SOB      Current Medication: Outpatient Encounter Medications as of 04/14/2023  Medication Sig   apixaban (ELIQUIS) 5 MG TABS tablet Take 1 tablet (5 mg total) by mouth 2 (two) times daily.   metoprolol succinate (TOPROL-XL) 25 MG 24 hr tablet Take 1 tablet (25 mg total) by mouth daily.   potassium chloride SA (KLOR-CON M) 20 MEQ tablet Take 1 tablet (20 mEq total) by mouth daily.   triamterene-hydrochlorothiazide (MAXZIDE-25) 37.5-25 MG tablet Take 1 tablet by mouth daily.   [DISCONTINUED] medroxyPROGESTERone (DEPO-PROVERA) 150 MG/ML injection Inject 1 mL (150 mg total) into the muscle every 3 (three) months. (Patient not taking: Reported on 04/30/2021)   No facility-administered encounter medications on file as of 04/14/2023.    Surgical History: Past Surgical History:  Procedure Laterality Date   COLONOSCOPY WITH PROPOFOL N/A 02/08/2021   Procedure: COLONOSCOPY WITH PROPOFOL;  Surgeon: Toney Reil, MD;  Location: [facility] ENDOSCOPY;  Service: Gastroenterology;  Laterality: N/A;   EMBOLIZATION N/A 07/29/2021   Procedure: EMBOLIZATION;  Surgeon: Renford Dills, MD;  Location: [facility] INVASIVE CV LAB;  Service: Cardiovascular;   Laterality: N/A;   EMBOLIZATION N/A 09/02/2021   Procedure: EMBOLIZATION;  Surgeon: Renford Dills, MD;  Location: [facility] INVASIVE CV LAB;  Service: Cardiovascular;  Laterality: N/A;   Emobolization  09/02/2021   ESOPHAGOGASTRODUODENOSCOPY N/A 02/08/2021   Procedure: ESOPHAGOGASTRODUODENOSCOPY (EGD);  Surgeon: Toney Reil, MD;  Location: [facility] ENDOSCOPY;  Service: Gastroenterology;  Laterality: N/A;   GIVENS CAPSULE STUDY N/A 02/09/2021   Procedure: GIVENS CAPSULE STUDY;  Surgeon: Regis Bill, MD;  Location: [facility] ENDOSCOPY;  Service: Endoscopy;  Laterality: N/A;    Medical History: Past Medical History:  Diagnosis Date   Anemia    CHF (congestive heart failure) (HCC)    Headache 06/17/2022   Hypertension     Family History: Family History  Problem Relation Age of Onset   Diabetes Mother    COPD Mother    Heart Problems Mother    Emphysema Father    Diabetes Maternal Aunt    Obesity Maternal Aunt    Arthritis Maternal Aunt    Hypercholesterolemia Brother    Breast cancer Neg Hx     Social History   Socioeconomic History   Marital status: Single    Spouse name: Not on file   Number of children: 1   Years of education: Not on file   Highest education level: Not on file  Occupational History   Not on file  Tobacco Use   Smoking status: Never   Smokeless tobacco: Never  Vaping Use   Vaping status: Never Used  Substance and Sexual Activity   Alcohol use: Not Currently  Comment: 6-7 months ago   Drug use: Not Currently    Types: Marijuana, Cocaine   Sexual activity: Not Currently  Other Topics Concern   Not on file  Social History Narrative   Lives with brother currently and niece    Social Determinants of Health   Financial Resource Strain: High Risk (07/16/2022)   Overall Financial Resource Strain (CARDIA)    Difficulty of Paying Living Expenses: Very hard  Food Insecurity: No Food Insecurity (07/16/2022)   Hunger Vital Sign    Worried  About Running Out of Food in the Last Year: Never true    Ran Out of Food in the Last Year: Never true  Transportation Needs: Unmet Transportation Needs (02/03/2023)   PRAPARE - Administrator, Civil Service (Medical): Yes    Lack of Transportation (Non-Medical): Yes  Physical Activity: Not on file  Stress: Not on file  Social Connections: Not on file  Intimate Partner Violence: Not At Risk (06/17/2022)   Humiliation, Afraid, Rape, and Kick questionnaire    Fear of Current or Ex-Partner: No    Emotionally Abused: No    Physically Abused: No    Sexually Abused: No      Review of Systems  Constitutional:  Positive for fatigue. Negative for chills and unexpected weight change.  HENT:  Negative for congestion, postnasal drip, rhinorrhea, sneezing and sore throat.   Eyes:  Negative for redness.  Respiratory:  Positive for shortness of breath. Negative for cough and chest tightness.   Cardiovascular:  Positive for chest pain (chest xray done -- patient in AFIB) and leg swelling (significantly improved.). Negative for palpitations.  Gastrointestinal:  Negative for abdominal pain, constipation, diarrhea, nausea and vomiting.  Genitourinary:  Negative for dysuria and frequency.  Musculoskeletal: Negative.  Negative for arthralgias, back pain, joint swelling and neck pain.  Skin:  Negative for rash.  Neurological:  Positive for numbness (and tingling in feet). Negative for tremors.  Hematological:  Negative for adenopathy. Does not bruise/bleed easily.  Psychiatric/Behavioral:  Negative for behavioral problems (Depression), sleep disturbance and suicidal ideas. The patient is not nervous/anxious.     Vital Signs: BP 110/65 Comment: 140/90  Pulse 66   Temp (!) 97.5 F (36.4 C)   Resp 16   Ht 5\' 6"  (1.676 m)   Wt (!) 323 lb 9.6 oz (146.8 kg)   SpO2 98%   BMI 52.23 kg/m    Physical Exam Vitals reviewed.  Constitutional:      General: She is not in acute distress.     Appearance: Normal appearance. She is obese. She is not ill-appearing.  HENT:     Head: Normocephalic and atraumatic.  Eyes:     Pupils: Pupils are equal, round, and reactive to light.  Cardiovascular:     Rate and Rhythm: Tachycardia present. Rhythm irregularly irregular.     Heart sounds: Murmur heard.  Pulmonary:     Effort: Pulmonary effort is normal. No accessory muscle usage or respiratory distress.     Breath sounds: Normal breath sounds and air entry.  Musculoskeletal:     Right lower leg: 1+ Edema present.     Left lower leg: 1+ Edema present.  Neurological:     Mental Status: She is alert and oriented to person, place, and time.  Psychiatric:        Mood and Affect: Mood normal.        Behavior: Behavior normal.        Assessment/Plan: 1. Paroxysmal  atrial fibrillation (HCC) Started on eliquis 5 mg twice daily and metoprolol succinate 25 mg daily. Samples of eliquis provided to patient. Urgent referral to cardiology ordered.  - metoprolol succinate (TOPROL-XL) 25 MG 24 hr tablet; Take 1 tablet (25 mg total) by mouth daily.  Dispense: 30 tablet; Refill: 3 - Ambulatory referral to Cardiology - apixaban (ELIQUIS) 5 MG TABS tablet; Take 1 tablet (5 mg total) by mouth 2 (two) times daily.  Dispense: 60 tablet; Refill: 3  2. Atypical chest pain EKG done showing current atrial fibrillation with fluctuating rate, confirmed with auscultation - EKG 12-Lead  3. Bilateral lower extremity edema Improving further  4. Lymphedema associated with obesity Improving with current medication dose   5. Hypokalemia Repeat potassium level - Potassium  6. Symptomatic anemia Will see her hematologist next month    General Counseling: brealyn foerst understanding of the findings of todays visit and agrees with plan of treatment. I have discussed any further diagnostic evaluation that may be needed or ordered today. We also reviewed her medications today. she has been encouraged to  call the office with any questions or concerns that should arise related to todays visit.    Orders Placed This Encounter  Procedures   Potassium   Ambulatory referral to Cardiology   EKG 12-Lead    Meds ordered this encounter  Medications   metoprolol succinate (TOPROL-XL) 25 MG 24 hr tablet    Sig: Take 1 tablet (25 mg total) by mouth daily.    Dispense:  30 tablet    Refill:  3   apixaban (ELIQUIS) 5 MG TABS tablet    Sig: Take 1 tablet (5 mg total) by mouth 2 (two) times daily.    Dispense:  60 tablet    Refill:  3    Return in about 4 weeks (around 05/12/2023) for F/U, eval new med, BP check, Odes Lolli PCP.   Total time spent:30 Minutes Time spent includes review of chart, medications, test results, and follow up plan with the patient.   [place] Controlled Substance Database was reviewed by me.  This patient was seen by Sallyanne Kuster, FNP-C in collaboration with Dr. Beverely Risen as a part of collaborative care agreement.   Lulla Linville R. Tedd Sias, MSN, FNP-C Internal medicine

## 2023-04-16 ENCOUNTER — Encounter: Payer: Self-pay | Admitting: Nurse Practitioner

## 2023-04-16 ENCOUNTER — Other Ambulatory Visit: Payer: Self-pay

## 2023-04-22 ENCOUNTER — Telehealth: Payer: Self-pay | Admitting: Nurse Practitioner

## 2023-04-22 NOTE — Telephone Encounter (Signed)
Received [facility]DS medical evaluation services, Mailed to [id number] [address] [facility] [address] [place], [place] [address]

## 2023-05-12 ENCOUNTER — Encounter: Payer: Self-pay | Admitting: Nurse Practitioner

## 2023-05-12 ENCOUNTER — Ambulatory Visit (INDEPENDENT_AMBULATORY_CARE_PROVIDER_SITE_OTHER): Payer: Medicaid Other | Admitting: Nurse Practitioner

## 2023-05-12 VITALS — BP 115/65 | HR 76 | Temp 98.1°F | Resp 16 | Ht 66.0 in | Wt 328.4 lb

## 2023-05-12 DIAGNOSIS — R600 Localized edema: Secondary | ICD-10-CM | POA: Diagnosis not present

## 2023-05-12 DIAGNOSIS — I10 Essential (primary) hypertension: Secondary | ICD-10-CM | POA: Diagnosis not present

## 2023-05-12 DIAGNOSIS — I480 Paroxysmal atrial fibrillation: Secondary | ICD-10-CM

## 2023-05-12 NOTE — Progress Notes (Signed)
[facility] [address] [place], [place] [address]  Internal MEDICINE  Office Visit Note  Patient Name: Lauren Hensley  [id number]  [id number]  Date of Service: 05/12/2023  Chief Complaint  Patient presents with   Hypertension   Follow-up    Eval new med.     HPI Lauren Hensley presents for a follow-up visit for AFIB, hypertension, and BLE edema. AFIB -- chest pain is resolved but still gets SOB frequently. Palpitations have calmed down. Waiting for her upcoming appt with cardiology.  Hypertension -- BP improved, stable.  Lower extremity edema is still much improved.     Current Medication: Outpatient Encounter Medications as of 05/12/2023  Medication Sig   apixaban (ELIQUIS) 5 MG TABS tablet Take 1 tablet (5 mg total) by mouth 2 (two) times daily.   metoprolol succinate (TOPROL-XL) 25 MG 24 hr tablet Take 1 tablet (25 mg total) by mouth daily.   potassium chloride SA (KLOR-CON M) 20 MEQ tablet Take 1 tablet (20 mEq total) by mouth daily.   triamterene-hydrochlorothiazide (MAXZIDE-25) 37.5-25 MG tablet Take 1 tablet by mouth daily.   [DISCONTINUED] medroxyPROGESTERone (DEPO-PROVERA) 150 MG/ML injection Inject 1 mL (150 mg total) into the muscle every 3 (three) months. (Patient not taking: Reported on 04/30/2021)   No facility-administered encounter medications on file as of 05/12/2023.    Surgical History: Past Surgical History:  Procedure Laterality Date   COLONOSCOPY WITH PROPOFOL N/A 02/08/2021   Procedure: COLONOSCOPY WITH PROPOFOL;  Surgeon: Toney Reil, MD;  Location: [facility] ENDOSCOPY;  Service: Gastroenterology;  Laterality: N/A;   EMBOLIZATION (CATH LAB) N/A 07/29/2021   Procedure: EMBOLIZATION;  Surgeon: Renford Dills, MD;  Location: [facility] INVASIVE CV LAB;  Service: Cardiovascular;  Laterality: N/A;   EMBOLIZATION (CATH LAB) N/A 09/02/2021   Procedure: EMBOLIZATION;  Surgeon: Renford Dills, MD;  Location: [facility] INVASIVE CV LAB;  Service:  Cardiovascular;  Laterality: N/A;   Emobolization  09/02/2021   ESOPHAGOGASTRODUODENOSCOPY N/A 02/08/2021   Procedure: ESOPHAGOGASTRODUODENOSCOPY (EGD);  Surgeon: Toney Reil, MD;  Location: [facility] ENDOSCOPY;  Service: Gastroenterology;  Laterality: N/A;   GIVENS CAPSULE STUDY N/A 02/09/2021   Procedure: GIVENS CAPSULE STUDY;  Surgeon: Regis Bill, MD;  Location: [facility] ENDOSCOPY;  Service: Endoscopy;  Laterality: N/A;    Medical History: Past Medical History:  Diagnosis Date   Anemia    CHF (congestive heart failure) (HCC)    Headache 06/17/2022   Hypertension     Family History: Family History  Problem Relation Age of Onset   Diabetes Mother    COPD Mother    Heart Problems Mother    Emphysema Father    Diabetes Maternal Aunt    Obesity Maternal Aunt    Arthritis Maternal Aunt    Hypercholesterolemia Brother    Breast cancer Neg Hx     Social History   Socioeconomic History   Marital status: Single    Spouse name: Not on file   Number of children: 1   Years of education: Not on file   Highest education level: Not on file  Occupational History   Not on file  Tobacco Use   Smoking status: Never   Smokeless tobacco: Never  Vaping Use   Vaping status: Never Used  Substance and Sexual Activity   Alcohol use: Not Currently    Comment: 6-7 months ago   Drug use: Not Currently    Types: Marijuana, Cocaine   Sexual activity: Not Currently  Other Topics Concern   Not on  file  Social History Narrative   Lives with brother currently and niece    Social Determinants of Health   Financial Resource Strain: High Risk (07/16/2022)   Overall Financial Resource Strain (CARDIA)    Difficulty of Paying Living Expenses: Very hard  Food Insecurity: No Food Insecurity (07/16/2022)   Hunger Vital Sign    Worried About Running Out of Food in the Last Year: Never true    Ran Out of Food in the Last Year: Never true  Transportation Needs: Unmet Transportation Needs  (02/03/2023)   PRAPARE - Administrator, Civil Service (Medical): Yes    Lack of Transportation (Non-Medical): Yes  Physical Activity: Not on file  Stress: Not on file  Social Connections: Not on file  Intimate Partner Violence: Not At Risk (06/17/2022)   Humiliation, Afraid, Rape, and Kick questionnaire    Fear of Current or Ex-Partner: No    Emotionally Abused: No    Physically Abused: No    Sexually Abused: No      Review of Systems  Constitutional:  Positive for fatigue. Negative for chills and unexpected weight change.  HENT:  Negative for congestion, postnasal drip, rhinorrhea, sneezing and sore throat.   Eyes:  Negative for redness.  Respiratory:  Positive for shortness of breath (off and on). Negative for cough and chest tightness.   Cardiovascular:  Positive for leg swelling (significantly improved.). Negative for chest pain and palpitations.  Gastrointestinal:  Negative for abdominal pain, constipation, diarrhea, nausea and vomiting.  Genitourinary:  Negative for dysuria and frequency.  Musculoskeletal: Negative.  Negative for arthralgias, back pain, joint swelling and neck pain.  Skin:  Negative for rash.  Neurological:  Positive for numbness (and tingling in feet). Negative for tremors.  Hematological:  Negative for adenopathy. Does not bruise/bleed easily.  Psychiatric/Behavioral:  Negative for behavioral problems (Depression), sleep disturbance and suicidal ideas. The patient is not nervous/anxious.     Vital Signs: BP 115/65 Comment: 138/86  Pulse 76   Temp 98.1 F (36.7 C)   Resp 16   Ht 5\' 6"  (1.676 m)   Wt (!) 328 lb 6.4 oz (149 kg)   SpO2 99%   BMI 53.01 kg/m    Physical Exam Vitals reviewed.  Constitutional:      General: She is not in acute distress.    Appearance: Normal appearance. She is obese. She is not ill-appearing.  HENT:     Head: Normocephalic and atraumatic.  Eyes:     Pupils: Pupils are equal, round, and reactive to light.   Cardiovascular:     Rate and Rhythm: Normal rate. Rhythm irregularly irregular.     Heart sounds: Murmur heard.  Pulmonary:     Effort: Pulmonary effort is normal. No accessory muscle usage or respiratory distress.     Breath sounds: Normal breath sounds and air entry.  Musculoskeletal:     Right lower leg: 1+ Edema present.     Left lower leg: 1+ Edema present.  Neurological:     Mental Status: She is alert and oriented to person, place, and time.  Psychiatric:        Mood and Affect: Mood normal.        Behavior: Behavior normal.        Assessment/Plan: 1. Paroxysmal atrial fibrillation [facility]) Upcoming cardiology appointment on 9/26. Continue eliquis and metoprolol as prescribed for now  2. Essential hypertension Stable, no changes, continue medication as prescribed.   3. Bilateral lower extremity edema Improved,  continue medication as prescribed.    General Counseling: Lauren Hensley understanding of the findings of todays visit and agrees with plan of treatment. I have discussed any further diagnostic evaluation that may be needed or ordered today. We also reviewed her medications today. she has been encouraged to call the office with any questions or concerns that should arise related to todays visit.    No orders of the defined types were placed in this encounter.   No orders of the defined types were placed in this encounter.   Return in about 3 months (around 08/12/2023) for F/U, [facility] PCP BP, swelling and Afib .   Total time spent:30 Minutes Time spent includes review of chart, medications, test results, and follow up plan with the patient.   [place] Controlled Substance Database was reviewed by me.  This patient was seen by Sallyanne Kuster, FNP-C in collaboration with Dr. Beverely Risen as a part of collaborative care agreement.   Lauren Hensley R. Tedd Sias, MSN, FNP-C Internal medicine

## 2023-05-18 ENCOUNTER — Other Ambulatory Visit: Payer: Self-pay

## 2023-05-31 ENCOUNTER — Encounter: Payer: Self-pay | Admitting: Nurse Practitioner

## 2023-06-18 ENCOUNTER — Other Ambulatory Visit: Payer: Self-pay | Admitting: Nurse Practitioner

## 2023-06-18 ENCOUNTER — Other Ambulatory Visit: Payer: Self-pay

## 2023-06-18 DIAGNOSIS — R600 Localized edema: Secondary | ICD-10-CM

## 2023-06-18 DIAGNOSIS — I509 Heart failure, unspecified: Secondary | ICD-10-CM

## 2023-06-18 DIAGNOSIS — I890 Lymphedema, not elsewhere classified: Secondary | ICD-10-CM

## 2023-06-21 NOTE — Telephone Encounter (Signed)
Please review

## 2023-06-22 ENCOUNTER — Other Ambulatory Visit: Payer: Self-pay

## 2023-06-22 MED FILL — Triamterene & Hydrochlorothiazide Tab 37.5-25 MG: ORAL | 30 days supply | Qty: 30 | Fill #0 | Status: AC

## 2023-06-22 MED FILL — Potassium Chloride Microencapsulated Crys ER Tab 20 mEq: ORAL | 30 days supply | Qty: 30 | Fill #0 | Status: AC

## 2023-06-24 ENCOUNTER — Encounter: Payer: Self-pay | Admitting: Cardiology

## 2023-06-24 ENCOUNTER — Encounter: Payer: Self-pay | Admitting: Internal Medicine

## 2023-06-24 ENCOUNTER — Ambulatory Visit: Payer: Medicaid Other | Attending: Cardiology | Admitting: Cardiology

## 2023-06-24 VITALS — BP 116/82 | HR 72 | Ht 68.0 in | Wt 334.4 lb

## 2023-06-24 DIAGNOSIS — I4892 Unspecified atrial flutter: Secondary | ICD-10-CM

## 2023-06-24 DIAGNOSIS — I10 Essential (primary) hypertension: Secondary | ICD-10-CM

## 2023-06-24 NOTE — H&P (View-Only) (Signed)
Cardiology Office Note:    Date:  06/24/2023   ID:  Lauren Hensley, DOB 11/09/1965, MRN [id number]  PCP:  Sallyanne Kuster, NP   [facility] Providers Cardiologist:  Debbe Odea, MD     Referring MD: Sallyanne Kuster, NP   Chief Complaint  Patient presents with   New Patient (Initial Visit)    Referred for cardiac evaluation of paroxysmal atrial fibrillation.  Previously seen by [facility] Cardiology with last visit in 2022.  Patient reports intermittent chest pain with constant SOBr.      History of Present Illness:    Lauren Hensley is a 57 y.o. female with a hx of hypertension, paroxysmal atrial fibrillation who presents with chest pain and palpitations.  Patient saw her primary care provider 2 months ago with symptoms of chest pain, shortness of breath and palpitations, EKG in the office showed atrial flutter.  Was started on metoprolol and Eliquis.  Has been compliant with Eliquis and metoprolol since, still has occasional shortness of breath and chest pressure.  Denies any previous known history of heart disease.  Echocardiogram 10/23 EF 55 to 60%  Past Medical History:  Diagnosis Date   Anemia    CHF (congestive heart failure) (HCC)    Headache 06/17/2022   Hypertension     Past Surgical History:  Procedure Laterality Date   COLONOSCOPY WITH PROPOFOL N/A 02/08/2021   Procedure: COLONOSCOPY WITH PROPOFOL;  Surgeon: Toney Reil, MD;  Location: [facility] ENDOSCOPY;  Service: Gastroenterology;  Laterality: N/A;   EMBOLIZATION (CATH LAB) N/A 07/29/2021   Procedure: EMBOLIZATION;  Surgeon: Renford Dills, MD;  Location: [facility] INVASIVE CV LAB;  Service: Cardiovascular;  Laterality: N/A;   EMBOLIZATION (CATH LAB) N/A 09/02/2021   Procedure: EMBOLIZATION;  Surgeon: Renford Dills, MD;  Location: [facility] INVASIVE CV LAB;  Service: Cardiovascular;  Laterality: N/A;   Emobolization  09/02/2021   ESOPHAGOGASTRODUODENOSCOPY N/A 02/08/2021   Procedure:  ESOPHAGOGASTRODUODENOSCOPY (EGD);  Surgeon: Toney Reil, MD;  Location: [facility] ENDOSCOPY;  Service: Gastroenterology;  Laterality: N/A;   GIVENS CAPSULE STUDY N/A 02/09/2021   Procedure: GIVENS CAPSULE STUDY;  Surgeon: Regis Bill, MD;  Location: [facility] ENDOSCOPY;  Service: Endoscopy;  Laterality: N/A;    Current Medications: Current Meds  Medication Sig   apixaban (ELIQUIS) 5 MG TABS tablet Take 1 tablet (5 mg total) by mouth 2 (two) times daily.   metoprolol succinate (TOPROL-XL) 25 MG 24 hr tablet Take 1 tablet (25 mg total) by mouth daily.   potassium chloride SA (KLOR-CON M) 20 MEQ tablet Take 1 tablet (20 mEq total) by mouth daily.   triamterene-hydrochlorothiazide (MAXZIDE-25) 37.5-25 MG tablet Take 1 tablet by mouth daily.     Allergies:   Patient has no known allergies.   Social History   Socioeconomic History   Marital status: Single    Spouse name: Not on file   Number of children: 1   Years of education: Not on file   Highest education level: Not on file  Occupational History   Not on file  Tobacco Use   Smoking status: Never   Smokeless tobacco: Never  Vaping Use   Vaping status: Never Used  Substance and Sexual Activity   Alcohol use: Not Currently    Comment: 6-7 months ago   Drug use: Not Currently    Types: Marijuana, Cocaine   Sexual activity: Not Currently  Other Topics Concern   Not on file  Social History Narrative   Lives with brother currently and  niece    Social Determinants of Health   Financial Resource Strain: High Risk (07/16/2022)   Overall Financial Resource Strain (CARDIA)    Difficulty of Paying Living Expenses: Very hard  Food Insecurity: No Food Insecurity (07/16/2022)   Hunger Vital Sign    Worried About Running Out of Food in the Last Year: Never true    Ran Out of Food in the Last Year: Never true  Transportation Needs: Unmet Transportation Needs (02/03/2023)   PRAPARE - Administrator, Civil Service  (Medical): Yes    Lack of Transportation (Non-Medical): Yes  Physical Activity: Not on file  Stress: Not on file  Social Connections: Not on file     Family History: The patient's family history includes Arthritis in her maternal aunt; COPD in her mother; Diabetes in her maternal aunt and mother; Emphysema in her father; Heart Problems in her mother; Heart disease in her mother; Hypercholesterolemia in her brother; Obesity in her maternal aunt. There is no history of Breast cancer.  ROS:   Please see the history of present illness.     All other systems reviewed and are negative.  EKGs/Labs/Other Studies Reviewed:    The following studies were reviewed today:  EKG Interpretation Date/Time:  Thursday June 24 2023 11:50:31 EDT Ventricular Rate:  72 PR Interval:    QRS Duration:  86 QT Interval:  236 QTC Calculation: 258 R Axis:   8  Text Interpretation: Atrial flutter with variable A-V block Nonspecific ST and T wave abnormality Confirmed by Debbe Odea ([id number]) on 06/24/2023 11:58:07 AM    Recent Labs: 02/03/2023: BUN 8; Creatinine, Ser 0.73; Hemoglobin 12.1; Platelets 176; Potassium 3.3; Sodium 134  Recent Lipid Panel No results found for: "CHOL", "TRIG", "HDL", "CHOLHDL", "VLDL", "LDLCALC", "LDLDIRECT"   Risk Assessment/Calculations:             Physical Exam:    VS:  BP 116/82 (BP Location: Left Arm, Patient Position: Sitting, Cuff Size: Large)   Pulse 72   Ht 5\' 8"  (1.727 m)   Wt (!) 334 lb 6.4 oz (151.7 kg)   SpO2 98%   BMI 50.85 kg/m     Wt Readings from Last 3 Encounters:  06/24/23 (!) 334 lb 6.4 oz (151.7 kg)  05/12/23 (!) 328 lb 6.4 oz (149 kg)  04/14/23 (!) 323 lb 9.6 oz (146.8 kg)     GEN:  Well nourished, well developed in no acute distress HEENT: Normal NECK: No JVD; No carotid bruits CARDIAC: RRR, no murmurs, rubs, gallops RESPIRATORY:  Clear to auscultation without rales, wheezing or rhonchi  ABDOMEN: Soft, non-tender,  non-distended MUSCULOSKELETAL:  No edema; No deformity  SKIN: Warm and dry NEUROLOGIC:  Alert and oriented x 3 PSYCHIATRIC:  Normal affect   ASSESSMENT:    1. Atrial flutter, unspecified type (HCC)   2. Primary hypertension   3. Morbid obesity (HCC)    PLAN:    In order of problems listed above:  Atrial flutter, variable block.  Heart rate controlled.  Echo 10/23 EF 55 to 60% continue metoprolol, Eliquis 5 mg twice daily.  Plan/schedule DC cardioversion.  Refer to A-fib clinic.  Likely not a candidate for invasive strategy due to morbid obesity. Hypertension, BP controlled.  Continue Toprol-XL 25 mg daily, Maxzide. Morbid obesity, low-calorie diet, weight loss advised.  Follow-up after cardioversion      Informed Consent   Shared Decision Making/Informed Consent The risks (stroke, cardiac arrhythmias rarely resulting in the need for a temporary  or permanent pacemaker, skin irritation or burns and complications associated with conscious sedation including aspiration, arrhythmia, respiratory failure and death), benefits (restoration of normal sinus rhythm) and alternatives of a direct current cardioversion were explained in detail to Ms. Morabito and she agrees to proceed.         Medication Adjustments/Labs and Tests Ordered: Current medicines are reviewed at length with the patient today.  Concerns regarding medicines are outlined above.  Orders Placed This Encounter  Procedures   Basic Metabolic Panel (BMET)   CBC   Ambulatory referral to Cardiac Electrophysiology   EKG 12-Lead   No orders of the defined types were placed in this encounter.   Patient Instructions  Medication Instructions:   Your physician recommends that you continue on your current medications as directed. Please refer to the Current Medication list given to you today.  *If you need a refill on your cardiac medications before your next appointment, please call your pharmacy*   Lab Work:  Your  physician recommends you have labs - BMP / CBC  If you have labs (blood work) drawn today and your tests are completely normal, you will receive your results only by: MyChart Message (if you have MyChart) OR A paper copy in the mail If you have any lab test that is abnormal or we need to change your treatment, we will call you to review the results.   Testing/Procedures:  You are scheduled for a Cardioversion on Monday 06/28/2023 with Dr. Azucena Cecil.    Please arrive at the [facility] Entrance of [facility], [address], [place] [address] at 11:30 am (This is 1 hour prior to your procedure time).  Proceed to the Check-In Desk directly inside the entrance.  Procedure Parking: Use the entrance off of the [address] side of the hospital. Turn right upon entering and follow the driveway to parking that is directly in front of the Heart & Vascular Center. There is no valet parking available at this entrance, however there is an awning directly in front of the Heart & Vascular Center for drop off/ pick up for patients  DIET: Nothing to eat or drink after midnight except a sip of water with medications (see medication instructions below)  Medication Instructions: Hold triamterene-hydrochlorothiazide (MAXZIDE-25) 37.5-25 MG tablet the morning of your Cardioversion Continue your anticoagulant: Eliquis If you miss a dose, please call us at [phone] You will need to continue your anticoagulant after your procedure until you are told by your provider that it is safe to stop.   Labs: TODAY  FYI: For your safety, and to allow [place] to monitor your vital signs accurately during the surgery/procedure we request that if you have artificial nails, gel coating, SNS etc. Please have those removed prior to your surgery/procedure. Not having the nail coverings /polish removed may result in cancellation or delay of your surgery/procedure.  You must have a responsible person to drive you  home and stay in the waiting area during your procedure. Failure to do so could result in cancellation.  Bring your insurance cards.  If you have any questions after you get home, please call the office at [phone]  *Special Note: Every effort is made to have your procedure done on time. Occasionally there are emergencies that occur at the hospital that may cause delays. Please be patient if a delay does occur.        Follow-Up: At [facility], you and your health needs are our priority.  As part of our continuing mission to provide you with exceptional heart care, we have created designated Provider Care Teams.  These Care Teams include your primary Cardiologist (physician) and Advanced Practice Providers (APPs -  Physician Assistants and Nurse Practitioners) who all work together to provide you with the care you need, when you need it.  We recommend signing up for the patient portal called "MyChart".  Sign up information is provided on this After Visit Summary.  MyChart is used to connect with patients for Virtual Visits (Telemedicine).  Patients are able to view lab/test results, encounter notes, upcoming appointments, etc.  Non-urgent messages can be sent to your provider as well.   To learn more about what you can do with MyChart, go to [email or web address].    Your next appointment:   4 - 6 week(s)  Provider:   You may see Debbe Odea, MD or one of the following Advanced Practice Providers on your designated Care Team:   Nicolasa Ducking, NP Eula Listen, PA-C Cadence Fransico Michael, PA-C Charlsie Quest, NP   Signed, Debbe Odea, MD  06/24/2023 12:29 PM    [facility]

## 2023-06-24 NOTE — Progress Notes (Signed)
Cardiology Office Note:    Date:  06/24/2023   ID:  Lauren Hensley, DOB 11/09/1965, MRN [id number]  PCP:  Sallyanne Kuster, NP   [facility] Providers Cardiologist:  Debbe Odea, MD     Referring MD: Sallyanne Kuster, NP   Chief Complaint  Patient presents with   New Patient (Initial Visit)    Referred for cardiac evaluation of paroxysmal atrial fibrillation.  Previously seen by [facility] with last visit in 2022.  Patient reports intermittent chest pain with constant SOBr.      History of Present Illness:    Lauren Hensley is a 57 y.o. female with a hx of hypertension, paroxysmal atrial fibrillation who presents with chest pain and palpitations.  Patient saw her primary care provider 2 months ago with symptoms of chest pain, shortness of breath and palpitations, EKG in the office showed atrial flutter.  Was started on metoprolol and Eliquis.  Has been compliant with Eliquis and metoprolol since, still has occasional shortness of breath and chest pressure.  Denies any previous known history of heart disease.  Echocardiogram 10/23 EF 55 to 60%  Past Medical History:  Diagnosis Date   Anemia    CHF (congestive heart failure) (HCC)    Headache 06/17/2022   Hypertension     Past Surgical History:  Procedure Laterality Date   COLONOSCOPY WITH PROPOFOL N/A 02/08/2021   Procedure: COLONOSCOPY WITH PROPOFOL;  Surgeon: Toney Reil, MD;  Location: [facility] ENDOSCOPY;  Service: Gastroenterology;  Laterality: N/A;   EMBOLIZATION (CATH LAB) N/A 07/29/2021   Procedure: EMBOLIZATION;  Surgeon: Renford Dills, MD;  Location: [facility] INVASIVE CV LAB;  Service: Cardiovascular;  Laterality: N/A;   EMBOLIZATION (CATH LAB) N/A 09/02/2021   Procedure: EMBOLIZATION;  Surgeon: Renford Dills, MD;  Location: [facility] INVASIVE CV LAB;  Service: Cardiovascular;  Laterality: N/A;   Emobolization  09/02/2021   ESOPHAGOGASTRODUODENOSCOPY N/A 02/08/2021   Procedure:  ESOPHAGOGASTRODUODENOSCOPY (EGD);  Surgeon: Toney Reil, MD;  Location: [facility] ENDOSCOPY;  Service: Gastroenterology;  Laterality: N/A;   GIVENS CAPSULE STUDY N/A 02/09/2021   Procedure: GIVENS CAPSULE STUDY;  Surgeon: Regis Bill, MD;  Location: [facility] ENDOSCOPY;  Service: Endoscopy;  Laterality: N/A;    Current Medications: Current Meds  Medication Sig   apixaban (ELIQUIS) 5 MG TABS tablet Take 1 tablet (5 mg total) by mouth 2 (two) times daily.   metoprolol succinate (TOPROL-XL) 25 MG 24 hr tablet Take 1 tablet (25 mg total) by mouth daily.   potassium chloride SA (KLOR-CON M) 20 MEQ tablet Take 1 tablet (20 mEq total) by mouth daily.   triamterene-hydrochlorothiazide (MAXZIDE-25) 37.5-25 MG tablet Take 1 tablet by mouth daily.     Allergies:   Patient has no known allergies.   Social History   Socioeconomic History   Marital status: Single    Spouse name: Not on file   Number of children: 1   Years of education: Not on file   Highest education level: Not on file  Occupational History   Not on file  Tobacco Use   Smoking status: Never   Smokeless tobacco: Never  Vaping Use   Vaping status: Never Used  Substance and Sexual Activity   Alcohol use: Not Currently    Comment: 6-7 months ago   Drug use: Not Currently    Types: Marijuana, Cocaine   Sexual activity: Not Currently  Other Topics Concern   Not on file  Social History Narrative   Lives with brother currently and  niece    Social Determinants of Health   Financial Resource Strain: High Risk (07/16/2022)   Overall Financial Resource Strain (CARDIA)    Difficulty of Paying Living Expenses: Very hard  Food Insecurity: No Food Insecurity (07/16/2022)   Hunger Vital Sign    Worried About Running Out of Food in the Last Year: Never true    Ran Out of Food in the Last Year: Never true  Transportation Needs: Unmet Transportation Needs (02/03/2023)   PRAPARE - Administrator, Civil Service  (Medical): Yes    Lack of Transportation (Non-Medical): Yes  Physical Activity: Not on file  Stress: Not on file  Social Connections: Not on file     Family History: The patient's family history includes Arthritis in her maternal aunt; COPD in her mother; Diabetes in her maternal aunt and mother; Emphysema in her father; Heart Problems in her mother; Heart disease in her mother; Hypercholesterolemia in her brother; Obesity in her maternal aunt. There is no history of Breast cancer.  ROS:   Please see the history of present illness.     All other systems reviewed and are negative.  EKGs/Labs/Other Studies Reviewed:    The following studies were reviewed today:  EKG Interpretation Date/Time:  Thursday June 24 2023 11:50:31 EDT Ventricular Rate:  72 PR Interval:    QRS Duration:  86 QT Interval:  236 QTC Calculation: 258 R Axis:   8  Text Interpretation: Atrial flutter with variable A-V block Nonspecific ST and T wave abnormality Confirmed by Debbe Odea ([id number]) on 06/24/2023 11:58:07 AM    Recent Labs: 02/03/2023: BUN 8; Creatinine, Ser 0.73; Hemoglobin 12.1; Platelets 176; Potassium 3.3; Sodium 134  Recent Lipid Panel No results found for: "CHOL", "TRIG", "HDL", "CHOLHDL", "VLDL", "LDLCALC", "LDLDIRECT"   Risk Assessment/Calculations:             Physical Exam:    VS:  BP 116/82 (BP Location: Left Arm, Patient Position: Sitting, Cuff Size: Large)   Pulse 72   Ht 5\' 8"  (1.727 m)   Wt (!) 334 lb 6.4 oz (151.7 kg)   SpO2 98%   BMI 50.85 kg/m     Wt Readings from Last 3 Encounters:  06/24/23 (!) 334 lb 6.4 oz (151.7 kg)  05/12/23 (!) 328 lb 6.4 oz (149 kg)  04/14/23 (!) 323 lb 9.6 oz (146.8 kg)     GEN:  Well nourished, well developed in no acute distress HEENT: Normal NECK: No JVD; No carotid bruits CARDIAC: RRR, no murmurs, rubs, gallops RESPIRATORY:  Clear to auscultation without rales, wheezing or rhonchi  ABDOMEN: Soft, non-tender,  non-distended MUSCULOSKELETAL:  No edema; No deformity  SKIN: Warm and dry NEUROLOGIC:  Alert and oriented x 3 PSYCHIATRIC:  Normal affect   ASSESSMENT:    1. Atrial flutter, unspecified type (HCC)   2. Primary hypertension   3. Morbid obesity (HCC)    PLAN:    In order of problems listed above:  Atrial flutter, variable block.  Heart rate controlled.  Echo 10/23 EF 55 to 60% continue metoprolol, Eliquis 5 mg twice daily.  Plan/schedule DC cardioversion.  Refer to A-fib clinic.  Likely not a candidate for invasive strategy due to morbid obesity. Hypertension, BP controlled.  Continue Toprol-XL 25 mg daily, Maxzide. Morbid obesity, low-calorie diet, weight loss advised.  Follow-up after cardioversion      Informed Consent   Shared Decision Making/Informed Consent The risks (stroke, cardiac arrhythmias rarely resulting in the need for a temporary  or permanent pacemaker, skin irritation or burns and complications associated with conscious sedation including aspiration, arrhythmia, respiratory failure and death), benefits (restoration of normal sinus rhythm) and alternatives of a direct current cardioversion were explained in detail to Ms. Morabito and she agrees to proceed.         Medication Adjustments/Labs and Tests Ordered: Current medicines are reviewed at length with the patient today.  Concerns regarding medicines are outlined above.  Orders Placed This Encounter  Procedures   Basic Metabolic Panel (BMET)   CBC   Ambulatory referral to Cardiac Electrophysiology   EKG 12-Lead   No orders of the defined types were placed in this encounter.   Patient Instructions  Medication Instructions:   Your physician recommends that you continue on your current medications as directed. Please refer to the Current Medication list given to you today.  *If you need a refill on your cardiac medications before your next appointment, please call your pharmacy*   Lab Work:  Your  physician recommends you have labs - BMP / CBC  If you have labs (blood work) drawn today and your tests are completely normal, you will receive your results only by: MyChart Message (if you have MyChart) OR A paper copy in the mail If you have any lab test that is abnormal or we need to change your treatment, we will call you to review the results.   Testing/Procedures:  You are scheduled for a Cardioversion on Monday 06/28/2023 with Dr. Azucena Cecil.    Please arrive at the [facility] Entrance of [facility], [address], [place] [address] at 11:30 am (This is 1 hour prior to your procedure time).  Proceed to the Check-In Desk directly inside the entrance.  Procedure Parking: Use the entrance off of the [address] side of the hospital. Turn right upon entering and follow the driveway to parking that is directly in front of the Heart & Vascular Center. There is no valet parking available at this entrance, however there is an awning directly in front of the Heart & Vascular Center for drop off/ pick up for patients  DIET: Nothing to eat or drink after midnight except a sip of water with medications (see medication instructions below)  Medication Instructions: Hold triamterene-hydrochlorothiazide (MAXZIDE-25) 37.5-25 MG tablet the morning of your Cardioversion Continue your anticoagulant: Eliquis If you miss a dose, please call us at [phone] You will need to continue your anticoagulant after your procedure until you are told by your provider that it is safe to stop.   Labs: TODAY  FYI: For your safety, and to allow [place] to monitor your vital signs accurately during the surgery/procedure we request that if you have artificial nails, gel coating, SNS etc. Please have those removed prior to your surgery/procedure. Not having the nail coverings /polish removed may result in cancellation or delay of your surgery/procedure.  You must have a responsible person to drive you  home and stay in the waiting area during your procedure. Failure to do so could result in cancellation.  Bring your insurance cards.  If you have any questions after you get home, please call the office at [phone]  *Special Note: Every effort is made to have your procedure done on time. Occasionally there are emergencies that occur at the hospital that may cause delays. Please be patient if a delay does occur.        Follow-Up: At [facility], you and your health needs are our priority.  As part of our continuing mission to provide you with exceptional heart care, we have created designated Provider Care Teams.  These Care Teams include your primary Cardiologist (physician) and Advanced Practice Providers (APPs -  Physician Assistants and Nurse Practitioners) who all work together to provide you with the care you need, when you need it.  We recommend signing up for the patient portal called "MyChart".  Sign up information is provided on this After Visit Summary.  MyChart is used to connect with patients for Virtual Visits (Telemedicine).  Patients are able to view lab/test results, encounter notes, upcoming appointments, etc.  Non-urgent messages can be sent to your provider as well.   To learn more about what you can do with MyChart, go to [email or web address].    Your next appointment:   4 - 6 week(s)  Provider:   You may see Debbe Odea, MD or one of the following Advanced Practice Providers on your designated Care Team:   Nicolasa Ducking, NP Eula Listen, PA-C Cadence Fransico Michael, PA-C Charlsie Quest, NP   Signed, Debbe Odea, MD  06/24/2023 12:29 PM    [facility]

## 2023-06-24 NOTE — Patient Instructions (Signed)
Medication Instructions:   Your physician recommends that you continue on your current medications as directed. Please refer to the Current Medication list given to you today.  *If you need a refill on your cardiac medications before your next appointment, please call your pharmacy*   Lab Work:  Your physician recommends you have labs - BMP / CBC  If you have labs (blood work) drawn today and your tests are completely normal, you will receive your results only by: MyChart Message (if you have MyChart) OR A paper copy in the mail If you have any lab test that is abnormal or we need to change your treatment, we will call you to review the results.   Testing/Procedures:  You are scheduled for a Cardioversion on Monday 06/28/2023 with Dr. Azucena Cecil.    Please arrive at the [facility] Entrance of [facility], [address], [place] [address] at 11:30 am (This is 1 hour prior to your procedure time).  Proceed to the Check-In Desk directly inside the entrance.  Procedure Parking: Use the entrance off of the [address] side of the hospital. Turn right upon entering and follow the driveway to parking that is directly in front of the Heart & Vascular Center. There is no valet parking available at this entrance, however there is an awning directly in front of the Heart & Vascular Center for drop off/ pick up for patients  DIET: Nothing to eat or drink after midnight except a sip of water with medications (see medication instructions below)  Medication Instructions: Hold triamterene-hydrochlorothiazide (MAXZIDE-25) 37.5-25 MG tablet the morning of your Cardioversion Continue your anticoagulant: Eliquis If you miss a dose, please call us at [phone] You will need to continue your anticoagulant after your procedure until you are told by your provider that it is safe to stop.   Labs: TODAY  FYI: For your safety, and to allow [place] to monitor your vital signs accurately during  the surgery/procedure we request that if you have artificial nails, gel coating, SNS etc. Please have those removed prior to your surgery/procedure. Not having the nail coverings /polish removed may result in cancellation or delay of your surgery/procedure.  You must have a responsible person to drive you home and stay in the waiting area during your procedure. Failure to do so could result in cancellation.  Bring your insurance cards.  If you have any questions after you get home, please call the office at [phone]  *Special Note: Every effort is made to have your procedure done on time. Occasionally there are emergencies that occur at the hospital that may cause delays. Please be patient if a delay does occur.        Follow-Up: At [facility], you and your health needs are our priority.  As part of our continuing mission to provide you with exceptional heart care, we have created designated Provider Care Teams.  These Care Teams include your primary Cardiologist (physician) and Advanced Practice Providers (APPs -  Physician Assistants and Nurse Practitioners) who all work together to provide you with the care you need, when you need it.  We recommend signing up for the patient portal called "MyChart".  Sign up information is provided on this After Visit Summary.  MyChart is used to connect with patients for Virtual Visits (Telemedicine).  Patients are able to view lab/test results, encounter notes, upcoming appointments, etc.  Non-urgent messages can be sent to your provider as well.   To learn more  about what you can do with MyChart, go to [email or web address].    Your next appointment:   4 - 6 week(s)  Provider:   You may see Debbe Odea, MD or one of the following Advanced Practice Providers on your designated Care Team:   Nicolasa Ducking, NP Eula Listen, PA-C Cadence Fransico Michael, PA-C Charlsie Quest, NP

## 2023-06-25 ENCOUNTER — Telehealth: Payer: Self-pay | Admitting: *Deleted

## 2023-06-25 LAB — CBC
Hematocrit: 38.7 % (ref 34.0–46.6)
Hemoglobin: 11.6 g/dL (ref 11.1–15.9)
MCH: 22 pg — ABNORMAL LOW (ref 26.6–33.0)
MCHC: 30 g/dL — ABNORMAL LOW (ref 31.5–35.7)
MCV: 73 fL — ABNORMAL LOW (ref 79–97)
Platelets: 194 10*3/uL (ref 150–450)
RBC: 5.27 x10E6/uL (ref 3.77–5.28)
RDW: 15 % (ref 11.7–15.4)
WBC: 4.5 10*3/uL (ref 3.4–10.8)

## 2023-06-25 LAB — BASIC METABOLIC PANEL
BUN/Creatinine Ratio: 16 (ref 9–23)
BUN: 15 mg/dL (ref 6–24)
CO2: 21 mmol/L (ref 20–29)
Calcium: 9.1 mg/dL (ref 8.7–10.2)
Chloride: 106 mmol/L (ref 96–106)
Creatinine, Ser: 0.95 mg/dL (ref 0.57–1.00)
Glucose: 78 mg/dL (ref 70–99)
Potassium: 4.3 mmol/L (ref 3.5–5.2)
Sodium: 142 mmol/L (ref 134–144)
eGFR: 71 mL/min/{1.73_m2} (ref >59)

## 2023-06-25 NOTE — Telephone Encounter (Signed)
Left voicemail message to call back for review of instructions for her procedure scheduled on Monday 9/30.

## 2023-06-27 MED ORDER — SODIUM CHLORIDE 0.9 % IV SOLN: INTRAVENOUS | Status: DC

## 2023-06-28 ENCOUNTER — Ambulatory Visit: Payer: Medicaid Other | Admitting: Anesthesiology

## 2023-06-28 ENCOUNTER — Ambulatory Visit
Admission: RE | Admit: 2023-06-28 | Discharge: 2023-06-28 | Disposition: A | Discharge status: Home / Self Care | Payer: Medicaid Other | Attending: Cardiology | Admitting: Cardiology

## 2023-06-28 ENCOUNTER — Other Ambulatory Visit: Payer: Self-pay

## 2023-06-28 ENCOUNTER — Encounter
Admission: RE | Disposition: A | Discharge status: Home / Self Care | Payer: Self-pay | Source: Home / Self Care | Attending: Cardiology

## 2023-06-28 ENCOUNTER — Encounter: Payer: Self-pay | Admitting: Cardiology

## 2023-06-28 DIAGNOSIS — I509 Heart failure, unspecified: Secondary | ICD-10-CM | POA: Insufficient documentation

## 2023-06-28 DIAGNOSIS — Z5982 Transportation insecurity: Secondary | ICD-10-CM | POA: Diagnosis not present

## 2023-06-28 DIAGNOSIS — I480 Paroxysmal atrial fibrillation: Secondary | ICD-10-CM | POA: Diagnosis present

## 2023-06-28 DIAGNOSIS — I110 Hypertensive heart disease with heart failure: Secondary | ICD-10-CM | POA: Diagnosis not present

## 2023-06-28 DIAGNOSIS — I4892 Unspecified atrial flutter: Secondary | ICD-10-CM

## 2023-06-28 DIAGNOSIS — Z6841 Body Mass Index (BMI) 40.0 and over, adult: Secondary | ICD-10-CM | POA: Insufficient documentation

## 2023-06-28 DIAGNOSIS — Z5986 Financial insecurity: Secondary | ICD-10-CM | POA: Diagnosis not present

## 2023-06-28 HISTORY — PX: CARDIOVERSION: SHX1299

## 2023-06-28 SURGERY — CARDIOVERSION
Anesthesia: General

## 2023-06-28 MED ORDER — PROPOFOL 10 MG/ML IV BOLUS
INTRAVENOUS | Status: DC | PRN
  Administered 2023-06-28: 40 mg via INTRAVENOUS
  Administered 2023-06-28: 30 mg via INTRAVENOUS
  Administered 2023-06-28: 20 mg via INTRAVENOUS
  Administered 2023-06-28: 10 mg via INTRAVENOUS
  Administered 2023-06-28: 20 mg via INTRAVENOUS
  Administered 2023-06-28: 10 mg via INTRAVENOUS

## 2023-06-28 NOTE — Anesthesia Preprocedure Evaluation (Signed)
Anesthesia Evaluation  Patient identified by MRN, date of birth, ID band Patient awake    Reviewed: Allergy & Precautions, NPO status , Patient's Chart, lab work & pertinent test results  History of Anesthesia Complications Negative for: history of anesthetic complications  Airway Mallampati: III  TM Distance: >3 FB Neck ROM: Full    Dental no notable dental hx. (+) Teeth Intact   Pulmonary neg pulmonary ROS, neg sleep apnea, neg COPD, Patient abstained from smoking.Not current smoker   Pulmonary exam normal breath sounds clear to auscultation       Cardiovascular Exercise Tolerance: Good METShypertension, +CHF  (-) CAD and (-) Past MI + dysrhythmias Atrial Fibrillation  Rhythm:Regular Rate:Normal - Systolic murmurs    Neuro/Psych  Headaches  negative psych ROS   GI/Hepatic ,neg GERD  ,,(+)     (-) substance abuse    Endo/Other  neg diabetes  Morbid obesity  Renal/GU negative Renal ROS     Musculoskeletal   Abdominal  (+) + obese  Peds  Hematology   Anesthesia Other Findings Past Medical History: No date: Anemia No date: CHF (congestive heart failure) (HCC) 06/17/2022: Headache No date: Hypertension  Reproductive/Obstetrics                             Anesthesia Physical Anesthesia Plan  ASA: 3  Anesthesia Plan: General   Post-op Pain Management: Minimal or no pain anticipated   Induction: Intravenous  PONV Risk Score and Plan: 3 and Propofol infusion, TIVA and Ondansetron  Airway Management Planned: Nasal Cannula  Additional Equipment: None  Intra-op Plan:   Post-operative Plan:   Informed Consent: I have reviewed the patients History and Physical, chart, labs and discussed the procedure including the risks, benefits and alternatives for the proposed anesthesia with the patient or authorized representative who has indicated his/her understanding and acceptance.      Dental advisory given  Plan Discussed with: CRNA and Surgeon  Anesthesia Plan Comments: (Discussed risks of anesthesia with patient, including possibility of difficulty with spontaneous ventilation under anesthesia necessitating airway intervention, PONV, and rare risks such as cardiac or respiratory or neurological events, and allergic reactions. Discussed the role of CRNA in patient's perioperative care. Patient understands. Patient informed about increased incidence of above perioperative risk due to high BMI. Patient understands.  )       Anesthesia Quick Evaluation

## 2023-06-28 NOTE — OR Nursing (Signed)
Pt returned to a flutter, replaced new zoll pads, and reanesthetized for repeat cardioversion

## 2023-06-28 NOTE — Transfer of Care (Signed)
Immediate Anesthesia Transfer of Care Note  Patient: Lauren Hensley  Procedure(s) Performed: CARDIOVERSION  Patient Location: cardiac specials  Anesthesia Type:General  Level of Consciousness: awake and drowsy  Airway & Oxygen Therapy: Patient Spontanous Breathing and Patient connected to nasal cannula oxygen  Post-op Assessment: Report given to RN and Post -op Vital signs reviewed and stable  Post vital signs: Reviewed and stable  Last Vitals:  Vitals Value Taken Time  BP 98/78 06/28/23 1256  Temp    Pulse 80 06/28/23 1257  Resp 14 06/28/23 1257  SpO2 98 % 06/28/23 1257  Vitals shown include unfiled device data.  Last Pain:  Vitals:   06/28/23 1147  TempSrc: Oral         Complications: No notable events documented.

## 2023-06-28 NOTE — Anesthesia Procedure Notes (Signed)
Date/Time: 06/28/2023 12:34 PM  Performed by: Ginger Carne, CRNAPre-anesthesia Checklist: Patient identified, Emergency Drugs available, Suction available, Patient being monitored and Timeout performed Patient Re-evaluated:Patient Re-evaluated prior to induction Oxygen Delivery Method: Nasal cannula Preoxygenation: Pre-oxygenation with 100% oxygen Induction Type: IV induction

## 2023-06-28 NOTE — Interval H&P Note (Signed)
History and Physical Interval Note:  06/28/2023 12:39 PM  Lauren Hensley  has presented today for surgery, with the diagnosis of atrial flutter.  The various methods of treatment have been discussed with the patient and family. After consideration of risks, benefits and other options for treatment, the patient has consented to  Procedure(s): CARDIOVERSION (N/A) as a surgical intervention.  The patient's history has been reviewed, patient examined, no change in status, stable for surgery.  I have reviewed the patient's chart and labs.  Questions were answered to the patient's satisfaction.     Arlys John Agbor-Etang

## 2023-06-28 NOTE — Procedures (Addendum)
Cardioversion procedure note For atrial fibrillation.  Procedure Details:  Consent: Risks of procedure as well as the alternatives and risks of each were explained to the (patient/caregiver).  Consent for procedure obtained.  Time Out: Verified patient identification, verified procedure, site/side was marked, verified correct patient position, special equipment/implants available, medications/allergies/relevent history reviewed, required imaging and test results available.  Performed  Patient placed on cardiac monitor, pulse oximetry, supplemental oxygen as necessary.   Sedation given: propofol IV, per anesthesia team. Pacer pads placed anterior and posterior chest.  Cardioverted 3 time(s).   Cardioverted at  150J x 2, 200J x 1. Synchronized biphasic  Converted to NSR initially, but returned to atrial flutter normal ventricular response a few minutes later (HR 85). Repeat cardioversion x 2 was unsuccessful.   Evaluation: Findings: Post procedure EKG shows: atrial flutter Complications: None Patient did tolerate procedure well.  Recommendations Cont lopressor, eliquis -outpatient EP referral already placed -osa eval at follow up. -morbid obesity likely contributing to failed DCCV attempts.  Time Spent Directly with the Patient:  25 minutes   Debbe Odea, M.D.

## 2023-06-28 NOTE — Anesthesia Postprocedure Evaluation (Signed)
Anesthesia Post Note  Patient: Lauren Hensley  Procedure(s) Performed: CARDIOVERSION  Patient location during evaluation: PACU Anesthesia Type: General Level of consciousness: awake and alert Pain management: pain level controlled Vital Signs Assessment: post-procedure vital signs reviewed and stable Respiratory status: spontaneous breathing, nonlabored ventilation, respiratory function stable and patient connected to nasal cannula oxygen Cardiovascular status: blood pressure returned to baseline and stable Postop Assessment: no apparent nausea or vomiting Anesthetic complications: no   No notable events documented.   Last Vitals:  Vitals:   06/28/23 1242 06/28/23 1256  BP:  98/78  Pulse: 86 71  Resp:  15  Temp:    SpO2: 99% 98%    Last Pain:  Vitals:   06/28/23 1147  TempSrc: Oral                 Corinda Gubler

## 2023-07-13 NOTE — Progress Notes (Unsigned)
Electrophysiology Office Note:    Date:  07/14/2023   ID:  Lauren Hensley, DOB 05-14-1966, MRN [id number]  [facility] Cardiologist:  Debbe Odea, MD  [facility] Electrophysiologist:  Lanier Prude, MD   Referring MD: Debbe Odea, MD   Chief Complaint: Atrial flutter  History of Present Illness:    Lauren Hensley is a 57 y.o. femalewho I am seeing today for an evaluation of atrial flutter at the request of Dr. Azucena Cecil.  The patient was last seen by Dr. Azucena Cecil on June 24, 2023.  The patient has a medical history that includes paroxysmal atrial flutter, hypertension.  Her diagnosis of atrial flutter was 2 months ago when she saw her primary care physician with palpitations and shortness of breath.  EKG showed atrial flutter.  She was started on metoprolol and Eliquis.  A cardioversion was planned..  She is doing well today.  She tells me that she feels short of breath frequently but she does not think this is related to her heart rhythm.  At times she will feel a fleeting chest pain that has also been difficult to associate with arrhythmia.  She takes her Eliquis twice daily without missed doses.  She also takes metoprolol succinate once daily.        Their past medical, social and family history was reveiwed.   ROS:   Please see the history of present illness.    All other systems reviewed and are negative.  EKGs/Labs/Other Studies Reviewed:    The following studies were reviewed today:  July 16, 2022 echo EF 55-60 RV normal No MR Moderate TR  April 14, 2023 and June 24, 2023 EKG shows atrial flutter with variable AV conduction June 16, 2022 EKG shows sinus rhythm  EKG Interpretation Date/Time:  Wednesday July 14 2023 10:41:36 EDT Ventricular Rate:  52 PR Interval:  162 QRS Duration:  86 QT Interval:  416 QTC Calculation: 386 R Axis:   -6  Text Interpretation: Sinus bradycardia Confirmed by Steffanie Dunn  [phone]) on 07/14/2023 10:57:31 AM    Physical Exam:    VS:  BP 134/80   Pulse (!) 52   Ht 5\' 8"  (1.727 m)   Wt (!) 327 lb (148.3 kg)   LMP 05/27/2023   SpO2 99%   BMI 49.72 kg/m     Wt Readings from Last 3 Encounters:  07/14/23 (!) 327 lb (148.3 kg)  06/28/23 (!) 328 lb (148.8 kg)  06/24/23 (!) 334 lb 6.4 oz (151.7 kg)     GEN:  Well nourished, well developed in no acute distress CARDIAC: RRR, no murmurs, rubs, gallops RESPIRATORY:  Clear to auscultation without rales, wheezing or rhonchi       ASSESSMENT AND PLAN:    1. Atrial flutter, unspecified type (HCC)   2. Primary hypertension     #Atrial flutter Symptomatic No evidence of atrial fibrillation thus far. Post prior cardioversion Continue Eliquis for stroke prophylaxis  Poor candidate for invasive EP procedures given morbid obesity.  I discussed this in detail with the patient during today's clinic appointment.   I would have her wear a 2-week ZIO monitor to see if any of her atypical symptoms are associated with arrhythmia.  If so or if there is a high burden of atrial flutter, I have recommended she start an antiarrhythmic.  This will be discussed at the follow-up appointment with Rosalita Chessman.  She would be a candidate for flecainide or Tikosyn (would need to stop HCTZ prior to initiation).   #  Hypertension At goal today.  Recommend checking blood pressures 1-2 times per week at home and recording the values.  Recommend bringing these recordings to the primary care physician.  Follow-up 6 to 8 weeks with Rosalita Chessman after monitor results.   Signed, Rossie Muskrat. Lalla Brothers, MD, [facility], [facility] 07/14/2023 10:57 AM    Electrophysiology [facility]

## 2023-07-14 ENCOUNTER — Encounter: Payer: Self-pay | Admitting: Cardiology

## 2023-07-14 ENCOUNTER — Ambulatory Visit: Payer: Medicaid Other

## 2023-07-14 ENCOUNTER — Ambulatory Visit: Payer: Medicaid Other | Attending: Cardiology | Admitting: Cardiology

## 2023-07-14 VITALS — BP 134/80 | HR 52 | Ht 68.0 in | Wt 327.0 lb

## 2023-07-14 DIAGNOSIS — I4892 Unspecified atrial flutter: Secondary | ICD-10-CM

## 2023-07-14 DIAGNOSIS — I10 Essential (primary) hypertension: Secondary | ICD-10-CM

## 2023-07-14 NOTE — Patient Instructions (Signed)
Medication Instructions:  Your physician recommends that you continue on your current medications as directed. Please refer to the Current Medication list given to you today.  *If you need a refill on your cardiac medications before your next appointment, please call your pharmacy*  Testing/Procedures: Your physician has recommended that you wear an event monitor. Event monitors are medical devices that record the heart's electrical activity. Doctors most often [place] these monitors to diagnose arrhythmias. Arrhythmias are problems with the speed or rhythm of the heartbeat. The monitor is a small, portable device. You can wear one while you do your normal daily activities. This is usually used to diagnose what is causing palpitations/syncope (passing out).  Follow-Up: At [facility], you and your health needs are our priority.  As part of our continuing mission to provide you with exceptional heart care, we have created designated Provider Care Teams.  These Care Teams include your primary Cardiologist (physician) and Advanced Practice Providers (APPs -  Physician Assistants and Nurse Practitioners) who all work together to provide you with the care you need, when you need it.  Your next appointment:   6-8 weeks  Provider:   Sherie Don, NP    Other Instructions ZIO XT- Long Term Monitor Instructions  Your physician has requested you wear a ZIO patch monitor for 14 days.  This is a single patch monitor. [employer] supplies one patch monitor per enrollment. Additional stickers are not available. Please do not apply patch if you will be having a Nuclear Stress Test,  Echocardiogram, Cardiac CT, MRI, or Chest Xray during the period you would be wearing the  monitor. The patch cannot be worn during these tests. You cannot remove and re-apply the  ZIO XT patch monitor.  Your ZIO patch monitor will be mailed 3 day USPS to your address on file. It may take 3-5 days  to receive your monitor  after you have been enrolled.  Once you have received your monitor, please review the enclosed instructions. Your monitor  has already been registered assigning a specific monitor serial # to you.  Billing and Patient Assistance Program Information  We have supplied [employer] with any of your insurance information on file for billing purposes. [employer] offers a sliding scale Patient Assistance Program for patients that do not have  insurance, or whose insurance does not completely cover the cost of the ZIO monitor.  You must apply for the Patient Assistance Program to qualify for this discounted rate.  To apply, please call [employer] at [phone], select option 4, select option 2, ask to apply for  Patient Assistance Program. Meredeth Ide will ask your household income, and how many people  are in your household. They will quote your out-of-pocket cost based on that information.  [employer] will also be able to set up a 15-month, interest-free payment plan if needed.  Applying the monitor   Shave hair from upper left chest.  Hold abrader disc by orange tab. Rub abrader in 40 strokes over the upper left chest as  indicated in your monitor instructions.  Clean area with 4 enclosed alcohol pads. Let dry.  Apply patch as indicated in monitor instructions. Patch will be placed under collarbone on left  side of chest with arrow pointing upward.  Rub patch adhesive wings for 2 minutes. Remove white label marked "1". Remove the white  label marked "2". Rub patch adhesive wings for 2 additional minutes.  While looking in a mirror, press and release button in center of patch.  A small green light will  flash 3-4 times. This will be your only indicator that the monitor has been turned on.  Do not shower for the first 24 hours. You may shower after the first 24 hours.  Press the button if you feel a symptom. You will hear a small click. Record Date, Time and  Symptom in the Patient Logbook.  When you are  ready to remove the patch, follow instructions on the last 2 pages of Patient  Logbook. Stick patch monitor onto the last page of Patient Logbook.  Place Patient Logbook in the blue and white box. Use locking tab on box and tape box closed  securely. The blue and white box has prepaid postage on it. Please place it in the mailbox as  soon as possible. Your physician should have your test results approximately 7 days after the  monitor has been mailed back to [facility].  Call [facility] Customer Care at [phone] if you have questions regarding  your ZIO XT patch monitor. Call them immediately if you see an orange light blinking on your  monitor.  If your monitor falls off in less than 4 days, contact our Monitor department at [phone].  If your monitor becomes loose or falls off after 4 days call [employer] at [phone] for  suggestions on securing your monitor

## 2023-07-14 NOTE — Addendum Note (Signed)
Addended by: Frutoso Schatz on: 07/14/2023 11:07 AM   Modules accepted: Orders

## 2023-07-19 DIAGNOSIS — I10 Essential (primary) hypertension: Secondary | ICD-10-CM

## 2023-07-19 DIAGNOSIS — I4892 Unspecified atrial flutter: Secondary | ICD-10-CM | POA: Diagnosis not present

## 2023-07-22 MED FILL — Triamterene & Hydrochlorothiazide Tab 37.5-25 MG: ORAL | 30 days supply | Qty: 30 | Fill #1 | Status: AC

## 2023-07-22 MED FILL — Potassium Chloride Microencapsulated Crys ER Tab 20 mEq: ORAL | 30 days supply | Qty: 30 | Fill #1 | Status: AC

## 2023-07-22 NOTE — Progress Notes (Signed)
Cardiology Clinic Note   Date: 07/23/2023 ID: Lauren Hensley, DOB [birth year], MRN [id number]  Primary Cardiologist:  Debbe Odea, MD  Patient Profile    Lauren Hensley is a 57 y.o. female who presents to the clinic today for follow up.     Past medical history significant for: Chronic systolic heart failure/nonischemic cardiomyopathy. Echo 07/16/2022: EF 55 to 60%.  No RWMA.  Mild LVH.  Normal diastolic parameters.  Normal RV function.  Normal PA pressure.  Moderate TR. A-flutter. Unsuccessful DCCV 06/28/2023. Anemia.  In summary, patient was first seen by Dr. Azucena Cecil on 06/24/2023 for PAF at the request of Sallyanne Kuster, NP.  Patient was seen by PCP July 2024 for chest pain, shortness of breath and palpitations.  EKG showed a-flutter and she was started on metoprolol and Eliquis.  She reported occasional shortness of breath and chest pressure since that time.  She had an echo on 07/16/2022 which showed normal LV/RV function with mild LVH and moderate TR.  Patient underwent unsuccessful DCCV on 06/28/2023.  She was seen by Dr. Lalla Brothers on 07/14/2023.  She reported frequent shortness of breath but did not feel as though it was related to her heart rhythm.  Also reported fleeting chest pain that had been difficult to associate with arrhythmia.  She would be a poor candidate for invasive EP procedures secondary to morbid obesity.  She was in sinus rhythm at that time.  She was placed on a 2-week ZIO to determine if any of her symptoms associated with arrhythmia.  If high burden of a-flutter recommendation would be for antiarrhythmic such as flecainide or Tikosyn.     History of Present Illness    Lauren Hensley is followed by Dr. Azucena Cecil for the above outlined history.  [facility] previously followed [facility] with last visit in 2022.   Discussed the use of AI scribe software for clinical note transcription with the patient, who gave verbal consent to proceed.  The patient,  with a history of atrial flutter, presents for follow-up. She reports intermittent chest pain, described as a 'shock' or 'pain' located on the left side of the chest, occurring approximately once every two days. The pain is brief lasting for less than a second and resolves spontaneously. She denies any associated shortness of breath.  The patient also reports a history of heavy exertional dyspnea, which has improved recently as she has been pacing her physical activity. She lower extremity edema has improved since changing her eating habits. She has been making dietary changes, including reducing fried food and increasing vegetable and fruit intake, which has resulted in a weight loss from 350 to 327 pounds. She expresses a desire to increase her physical activity, particularly through chair aerobics. She is finding it easier to move around now that her leg swelling has resolved. Patient denies blood in stool or urine. She recently noticed blood clots with her menstrual cycle. She has a history of fibroids with embolization. She will continue to monitor for increased bleeding.       ROS: All other systems reviewed and are otherwise negative except as noted in History of Present Illness.  Studies Reviewed    EKG is not ordered today.   Risk Assessment/Calculations     CHA2DS2-VASc Score = 2   This indicates a 2.2% annual risk of stroke. The patient's score is based upon: CHF History: 1 HTN History: 0 Diabetes History: 0 Stroke History: 0 Vascular Disease History: 0 Age Score: 0 Gender Score: 1  Physical Exam    VS:  BP (!) 128/90 (BP Location: Left Arm, Patient Position: Sitting, Cuff Size: Normal)   Pulse (!) 56   Ht 5\' 6"  (1.676 m)   Wt (!) 324 lb 9.6 oz (147.2 kg)   LMP 05/27/2023   SpO2 98%   BMI 52.39 kg/m  , BMI Body mass index is 52.39 kg/m.  GEN: Well nourished, well developed, in no acute distress. Neck: No JVD or carotid bruits. Cardiac:  RRR. No murmurs.  No rubs or gallops.   Respiratory:  Respirations regular and unlabored. Clear to auscultation without rales, wheezing or rhonchi. GI: Soft, nontender, nondistended. Extremities: Radials/DP/PT 2+ and equal bilaterally. No clubbing or cyanosis. No edema bilateral lower extremities but difficult to ascertain secondary to body habitus.   Skin: Warm and dry, no rash. Neuro: Strength intact.  Assessment & Plan      Chronic systolic heart failure/nonischemic cardiomyopathy Echo October 2023 showed normal LV/RV function, mild LVH, moderate TR.  Patient reports improvement in lower extremity edema. She denies orthopnea or PND. Improved DOE.  Euvolemic and well compensated on exam. -Continue Toprol, Maxzide.  Atrial Flutter Unsuccessful cardioversion in September. Converted to sinus rhythm sometime after that. Currently wearing a 14-day monitor.  Denies spontaneous bleeding concerns. She is maintaining sinus rhythm today. -Continue Eliquis. -Follow-up with Sherie Don, NP 12/2 as previously scheduled.   Physical Activity and Weight Loss Patient has lost weight and is making dietary changes. Reports increased energy and motivation. Plans to start chair aerobics. -Encourage continued weight loss and increased physical activity.       Disposition: Follow up with Sherie Don, NP on 12/2 as previously scheduled. Return in 4 months or sooner as needed.          Signed, Etta Grandchild. Dannie Woolen, DNP, NP-C

## 2023-07-23 ENCOUNTER — Encounter: Payer: Self-pay | Admitting: Student

## 2023-07-23 ENCOUNTER — Ambulatory Visit: Payer: Medicaid Other | Attending: Student | Admitting: Student

## 2023-07-23 VITALS — BP 128/90 | HR 56 | Ht 66.0 in | Wt 324.6 lb

## 2023-07-23 DIAGNOSIS — I428 Other cardiomyopathies: Secondary | ICD-10-CM

## 2023-07-23 DIAGNOSIS — I4892 Unspecified atrial flutter: Secondary | ICD-10-CM

## 2023-07-23 DIAGNOSIS — Z79899 Other long term (current) drug therapy: Secondary | ICD-10-CM | POA: Diagnosis not present

## 2023-07-23 DIAGNOSIS — I5022 Chronic systolic (congestive) heart failure: Secondary | ICD-10-CM | POA: Diagnosis not present

## 2023-07-23 NOTE — Patient Instructions (Signed)
Medication Instructions:  No changes *If you need a refill on your cardiac medications before your next appointment, please call your pharmacy*   Lab Work: Your provider would like for you to have following labs drawn today CBC.   If you have labs (blood work) drawn today and your tests are completely normal, you will receive your results only by: MyChart Message (if you have MyChart) OR A paper copy in the mail If you have any lab test that is abnormal or we need to change your treatment, we will call you to review the results.   Testing/Procedures: None ordered   Follow-Up: At [facility], you and your health needs are our priority.  As part of our continuing mission to provide you with exceptional heart care, we have created designated Provider Care Teams.  These Care Teams include your primary Cardiologist (physician) and Advanced Practice Providers (APPs -  Physician Assistants and Nurse Practitioners) who all work together to provide you with the care you need, when you need it.  We recommend signing up for the patient portal called "MyChart".  Sign up information is provided on this After Visit Summary.  MyChart is used to connect with patients for Virtual Visits (Telemedicine).  Patients are able to view lab/test results, encounter notes, upcoming appointments, etc.  Non-urgent messages can be sent to your provider as well.   To learn more about what you can do with MyChart, go to [email or web address].    Your next appointment:   4 month(s)  Provider:   Debbe Odea, MD

## 2023-07-24 LAB — CBC
Hematocrit: 35.2 % (ref 34.0–46.6)
Hemoglobin: 10.9 g/dL — ABNORMAL LOW (ref 11.1–15.9)
MCH: 22.6 pg — ABNORMAL LOW (ref 26.6–33.0)
MCHC: 31 g/dL — ABNORMAL LOW (ref 31.5–35.7)
MCV: 73 fL — ABNORMAL LOW (ref 79–97)
Platelets: 171 10*3/uL (ref 150–450)
RBC: 4.83 x10E6/uL (ref 3.77–5.28)
RDW: 16.2 % — ABNORMAL HIGH (ref 11.7–15.4)
WBC: 4 10*3/uL (ref 3.4–10.8)

## 2023-07-26 ENCOUNTER — Telehealth: Payer: Self-pay | Admitting: *Deleted

## 2023-07-26 NOTE — Telephone Encounter (Signed)
Darene Lamer, LPN 16/06/9603  7:29 AM EDT     Mychart 10/28

## 2023-07-26 NOTE — Telephone Encounter (Signed)
Attempted to call the patient with results. No answer- I left a detailed message on her voice mail (ok per DPR).   I asked that she call back with any further questions or concerns. Forwarding to her PCP.

## 2023-07-26 NOTE — Telephone Encounter (Signed)
Lauren Levering, NP 07/24/2023  2:20 PM EDT     Please let patient know hemoglobin is slightly low at 10.9 (low normal is 11.1). Continue to follow with PCP.   (Please forward results to PCP).   Thank you!   DW

## 2023-07-28 ENCOUNTER — Telehealth: Payer: Self-pay | Admitting: Nurse Practitioner

## 2023-07-28 NOTE — Telephone Encounter (Signed)
04/15/23-current MR faxed to [facility];[phone]-Toni

## 2023-08-05 ENCOUNTER — Encounter: Payer: Self-pay | Admitting: Nurse Practitioner

## 2023-08-05 ENCOUNTER — Inpatient Hospital Stay (HOSPITAL_BASED_OUTPATIENT_CLINIC_OR_DEPARTMENT_OTHER): Payer: Medicaid Other | Admitting: Nurse Practitioner

## 2023-08-05 ENCOUNTER — Inpatient Hospital Stay: Payer: Medicaid Other | Attending: Internal Medicine

## 2023-08-05 ENCOUNTER — Other Ambulatory Visit: Payer: Self-pay

## 2023-08-05 ENCOUNTER — Inpatient Hospital Stay: Payer: Medicaid Other

## 2023-08-05 VITALS — BP 126/69 | HR 51 | Temp 96.9°F | Wt 324.0 lb

## 2023-08-05 DIAGNOSIS — D500 Iron deficiency anemia secondary to blood loss (chronic): Secondary | ICD-10-CM

## 2023-08-05 DIAGNOSIS — I4892 Unspecified atrial flutter: Secondary | ICD-10-CM | POA: Diagnosis not present

## 2023-08-05 DIAGNOSIS — N920 Excessive and frequent menstruation with regular cycle: Secondary | ICD-10-CM | POA: Diagnosis present

## 2023-08-05 DIAGNOSIS — I509 Heart failure, unspecified: Secondary | ICD-10-CM | POA: Insufficient documentation

## 2023-08-05 LAB — IRON AND TIBC
Iron: 58 ug/dL (ref 28–170)
Saturation Ratios: 15 % (ref 10.4–31.8)
TIBC: 395 ug/dL (ref 250–450)
UIBC: 337 ug/dL

## 2023-08-05 LAB — CBC WITH DIFFERENTIAL (CANCER CENTER ONLY)
Abs Immature Granulocytes: 0.01 K/uL (ref 0.00–0.07)
Basophils Absolute: 0 K/uL (ref 0.0–0.1)
Basophils Relative: 1 %
Eosinophils Absolute: 0.1 K/uL (ref 0.0–0.5)
Eosinophils Relative: 1 %
HCT: 37 % (ref 36.0–46.0)
Hemoglobin: 11.7 g/dL — ABNORMAL LOW (ref 12.0–15.0)
Immature Granulocytes: 0 %
Lymphocytes Relative: 37 %
Lymphs Abs: 1.5 K/uL (ref 0.7–4.0)
MCH: 22.7 pg — ABNORMAL LOW (ref 26.0–34.0)
MCHC: 31.6 g/dL (ref 30.0–36.0)
MCV: 71.7 fL — ABNORMAL LOW (ref 80.0–100.0)
Monocytes Absolute: 0.4 K/uL (ref 0.1–1.0)
Monocytes Relative: 9 %
Neutro Abs: 2.2 K/uL (ref 1.7–7.7)
Neutrophils Relative %: 52 %
Platelet Count: 175 K/uL (ref 150–400)
RBC: 5.16 MIL/uL — ABNORMAL HIGH (ref 3.87–5.11)
RDW: 15.3 % (ref 11.5–15.5)
WBC Count: 4.1 K/uL (ref 4.0–10.5)
nRBC: 0 % (ref 0.0–0.2)

## 2023-08-05 LAB — BASIC METABOLIC PANEL - CANCER CENTER ONLY
Anion gap: 8 (ref 5–15)
BUN: 15 mg/dL (ref 6–20)
CO2: 23 mmol/L (ref 22–32)
Calcium: 9 mg/dL (ref 8.9–10.3)
Chloride: 102 mmol/L (ref 98–111)
Creatinine: 0.99 mg/dL (ref 0.44–1.00)
GFR, Estimated: >60 mL/min (ref >60)
Glucose, Bld: 99 mg/dL (ref 70–99)
Potassium: 4 mmol/L (ref 3.5–5.1)
Sodium: 133 mmol/L — ABNORMAL LOW (ref 135–145)

## 2023-08-05 LAB — FERRITIN: Ferritin: 43 ng/mL (ref 11–307)

## 2023-08-05 MED ORDER — FE BISGLY-VIT C-VIT B12-FA 28-60-0.008-0.4 MG PO CAPS
90.0000 | ORAL_CAPSULE | Freq: Every day | ORAL | 3 refills | Status: DC
  Filled 2023-08-05: qty 90, fill #0

## 2023-08-05 NOTE — Progress Notes (Signed)
[facility] OFFICE PROGRESS NOTE  Patient Care Team: Sallyanne Kuster, NP as PCP - General (Nurse Practitioner) Debbe Odea, MD as PCP - Cardiology (Cardiology) Lanier Prude, MD as PCP - Electrophysiology (Cardiology) Jim Like, RN as Registered Nurse Scarlett Presto, RN (Inactive) as Registered Nurse Earna Coder, MD as Consulting Physician (Oncology)   Cancer Staging  No matching staging information was found for the patient.   Oncology History   No history exists.   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   #Heavy menstrual cycles-fibroids [Dr Shuman]; DEC 2022- s/p uterine embolization.    INTERVAL HISTORY: Ambulating independently.  Alone.  Lauren Hensley 57 y.o.  female pleasant patient history of iron deficient anemia/beta thalassemia with history of menorrhagia s/p ablation who returns to clinic for follow up. She has not been taking iron pills and was unaware that she should be. Prefers to not take infusion due to concern of IV access and cost. She says she feels better since undergoing cardioversion and energy has improved.   Review of Systems  Constitutional:  Negative for chills, diaphoresis, fever, malaise/fatigue and weight loss.  HENT:  Negative for nosebleeds and sore throat.   Eyes:  Negative for double vision.  Respiratory:  Negative for cough, hemoptysis, sputum production, shortness of breath and wheezing.   Cardiovascular:  Negative for chest pain, palpitations, orthopnea and leg swelling.  Gastrointestinal:  Negative for abdominal pain, blood in stool, constipation, diarrhea, heartburn, melena, nausea and vomiting.  Genitourinary:  Negative for dysuria, frequency and urgency.  Musculoskeletal:  Negative for back pain and joint pain.  Skin: Negative.  Negative for itching and rash.  Neurological:  Negative for dizziness, tingling, focal weakness,  weakness and headaches.  Endo/Heme/Allergies:  Does not bruise/bleed easily.  Psychiatric/Behavioral:  Negative for depression. The patient is not nervous/anxious and does not have insomnia.     PAST MEDICAL HISTORY :  Past Medical History:  Diagnosis Date   Anemia    CHF (congestive heart failure) (HCC)    Headache 06/17/2022   Hypertension     PAST SURGICAL HISTORY :   Past Surgical History:  Procedure Laterality Date   CARDIOVERSION N/A 06/28/2023   Procedure: CARDIOVERSION;  Surgeon: Debbe Odea, MD;  Location: [facility] ORS;  Service: Cardiovascular;  Laterality: N/A;   COLONOSCOPY WITH PROPOFOL N/A 02/08/2021   Procedure: COLONOSCOPY WITH PROPOFOL;  Surgeon: Toney Reil, MD;  Location: [facility] ENDOSCOPY;  Service: Gastroenterology;  Laterality: N/A;   EMBOLIZATION (CATH LAB) N/A 07/29/2021   Procedure: EMBOLIZATION;  Surgeon: Renford Dills, MD;  Location: [facility] INVASIVE CV LAB;  Service: Cardiovascular;  Laterality: N/A;   EMBOLIZATION (CATH LAB) N/A 09/02/2021   Procedure: EMBOLIZATION;  Surgeon: Renford Dills, MD;  Location: [facility] INVASIVE CV LAB;  Service: Cardiovascular;  Laterality: N/A;   Emobolization  09/02/2021   ESOPHAGOGASTRODUODENOSCOPY N/A 02/08/2021   Procedure: ESOPHAGOGASTRODUODENOSCOPY (EGD);  Surgeon: Toney Reil, MD;  Location: [facility] ENDOSCOPY;  Service: Gastroenterology;  Laterality: N/A;   GIVENS CAPSULE STUDY N/A 02/09/2021   Procedure: GIVENS CAPSULE STUDY;  Surgeon: Regis Bill, MD;  Location: [facility] ENDOSCOPY;  Service: Endoscopy;  Laterality: N/A;    FAMILY HISTORY :   Family History  Problem Relation Age of Onset   Heart disease Mother    Diabetes Mother    COPD Mother    Heart Problems Mother    Emphysema Father    Hypercholesterolemia Brother  Diabetes Maternal Aunt    Obesity Maternal Aunt    Arthritis Maternal Aunt    Breast cancer Neg Hx     SOCIAL HISTORY:   Social History   Tobacco Use   Smoking  status: Never   Smokeless tobacco: Never  Vaping Use   Vaping status: Never Used  Substance Use Topics   Alcohol use: Not Currently    Comment: 6-7 months ago   Drug use: Not Currently    Types: Marijuana, Cocaine    ALLERGIES:  has No Known Allergies.  MEDICATIONS:  Current Outpatient Medications  Medication Sig Dispense Refill   apixaban (ELIQUIS) 5 MG TABS tablet Take 1 tablet (5 mg total) by mouth 2 (two) times daily. 60 tablet 3   Fe Bisgly-Vit C-Vit B12-FA 28-60-0.008-0.4 MG CAPS Take 90 tablets by mouth daily. 90 capsule 3   metoprolol succinate (TOPROL-XL) 25 MG 24 hr tablet Take 1 tablet (25 mg total) by mouth daily. 30 tablet 3   potassium chloride SA (KLOR-CON M) 20 MEQ tablet Take 1 tablet (20 mEq total) by mouth daily. 30 tablet 2   triamterene-hydrochlorothiazide (MAXZIDE-25) 37.5-25 MG tablet Take 1 tablet by mouth daily. 30 tablet 2   No current facility-administered medications for this visit.    PHYSICAL EXAMINATION:  BP 126/69 (BP Location: Left Arm, Patient Position: Sitting)   Pulse (!) 51   Temp (!) 96.9 F (36.1 C) (Tympanic)   Wt (!) 324 lb (147 kg)   SpO2 100%   BMI 52.29 kg/m   Filed Weights   08/05/23 1402  Weight: (!) 324 lb (147 kg)   Physical Exam Vitals reviewed.  Constitutional:      Appearance: She is obese. She is not ill-appearing.  HENT:     Head: Normocephalic and atraumatic.  Eyes:     Extraocular Movements: Extraocular movements intact.     Pupils: Pupils are equal, round, and reactive to light.  Cardiovascular:     Rate and Rhythm: Normal rate and regular rhythm.  Pulmonary:     Effort: No respiratory distress.  Abdominal:     General: There is no distension.     Palpations: Abdomen is soft.  Skin:    General: Skin is warm.     Coloration: Skin is not pale.  Neurological:     Mental Status: She is alert and oriented to person, place, and time.  Psychiatric:        Mood and Affect: Mood normal.        Behavior:  Behavior normal.     LABORATORY DATA:  I have reviewed the data as listed    Component Value Date/Time   NA 133 (L) 08/05/2023 1337   NA 142 06/24/2023 1230   K 4.0 08/05/2023 1337   CL 102 08/05/2023 1337   CO2 23 08/05/2023 1337   GLUCOSE 99 08/05/2023 1337   BUN 15 08/05/2023 1337   BUN 15 06/24/2023 1230   CREATININE 0.99 08/05/2023 1337   CALCIUM 9.0 08/05/2023 1337   PROT 7.2 06/16/2022 1436   ALBUMIN 3.6 06/16/2022 1436   AST 25 06/16/2022 1436   ALT 12 06/16/2022 1436   ALKPHOS 34 (L) 06/16/2022 1436   BILITOT 0.7 06/16/2022 1436   GFRNONAA >60 08/05/2023 1337    No results found for: "SPEP", "UPEP"  Lab Results  Component Value Date   WBC 4.1 08/05/2023   NEUTROABS 2.2 08/05/2023   HGB 11.7 (L) 08/05/2023   HCT 37.0 08/05/2023   MCV 71.7 (  L) 08/05/2023   PLT 175 08/05/2023      Chemistry      Component Value Date/Time   NA 133 (L) 08/05/2023 1337   NA 142 06/24/2023 1230   K 4.0 08/05/2023 1337   CL 102 08/05/2023 1337   CO2 23 08/05/2023 1337   BUN 15 08/05/2023 1337   BUN 15 06/24/2023 1230   CREATININE 0.99 08/05/2023 1337      Component Value Date/Time   CALCIUM 9.0 08/05/2023 1337   ALKPHOS 34 (L) 06/16/2022 1436   AST 25 06/16/2022 1436   ALT 12 06/16/2022 1436   BILITOT 0.7 06/16/2022 1436     Iron/TIBC/Ferritin/ %Sat    Component Value Date/Time   IRON 83 02/03/2023 1308   TIBC 392 02/03/2023 1308   FERRITIN 11 02/03/2023 1308   IRONPCTSAT 21 02/03/2023 1308     RADIOGRAPHIC STUDIES: I have personally reviewed the radiological images as listed and agreed with the findings in the report. No results found.   ASSESSMENT & PLAN:    # Iron deficiency anemia due to chronic blood loss- Microcytic anemia/B12 thalassemia minor however predominant iron deficient anemia secondary to heavy menstrual cycles [see below]; symptomatic. S/p IV Venofer. Last 06/2022.    #  Today hemoglobin is 11.7. History of noncompliance with oral iron.  Per patient, not taking oral iron in several years. Provided with samples of carbonyl iron but encouraged her to use gentle iron/iron bisglycinate which is less likely to cause GI upset & constipation. Take on edaily. She prefers to hold infusion today.   # Menorrhagia-fibroids s/p gynecology evaluation. S/p partial embolization left & right uterine artery  [Dr.Dew]-DC 2022]. No recurrent symptoms.   # Atrial flutter & CHF- s/p cardioversion; unsuccessful then converted to Sinus rhythm after. Followed by cardiology.    # IV access: POOR. Encouraged pre-visit hydration. May need to consider port in future.    needs transportation  # DISPOSITION No iron today 4-6 months- van; lab, (cbc, cmp, ferritin, iron studies, b12), Dr Donneta Romberg or me, +/- venofer- la  No problem-specific Assessment & Plan notes found for this encounter.   No orders of the defined types were placed in this encounter.  All questions were answered. The patient knows to call the clinic with any problems, questions or concerns.      Alinda Dooms, NP 08/05/2023 2:30 PM

## 2023-08-05 NOTE — Addendum Note (Signed)
Addended by: Alinda Deem H on: 08/05/2023 02:54 PM   Modules accepted: Orders

## 2023-08-06 ENCOUNTER — Ambulatory Visit: Payer: Medicaid Other

## 2023-08-06 ENCOUNTER — Other Ambulatory Visit: Payer: Medicaid Other

## 2023-08-06 ENCOUNTER — Ambulatory Visit: Payer: Medicaid Other | Admitting: Internal Medicine

## 2023-08-12 ENCOUNTER — Ambulatory Visit: Payer: Medicaid Other | Admitting: Nurse Practitioner

## 2023-08-16 ENCOUNTER — Other Ambulatory Visit: Payer: Self-pay

## 2023-08-16 ENCOUNTER — Encounter: Payer: Self-pay | Admitting: Nurse Practitioner

## 2023-08-16 ENCOUNTER — Ambulatory Visit (INDEPENDENT_AMBULATORY_CARE_PROVIDER_SITE_OTHER): Payer: Medicaid Other | Admitting: Nurse Practitioner

## 2023-08-16 VITALS — BP 130/80 | HR 60 | Temp 98.1°F | Resp 16 | Ht 66.0 in | Wt 328.2 lb

## 2023-08-16 DIAGNOSIS — R600 Localized edema: Secondary | ICD-10-CM

## 2023-08-16 DIAGNOSIS — I4892 Unspecified atrial flutter: Secondary | ICD-10-CM | POA: Diagnosis not present

## 2023-08-16 DIAGNOSIS — I10 Essential (primary) hypertension: Secondary | ICD-10-CM | POA: Diagnosis not present

## 2023-08-16 DIAGNOSIS — I480 Paroxysmal atrial fibrillation: Secondary | ICD-10-CM | POA: Insufficient documentation

## 2023-08-16 DIAGNOSIS — D649 Anemia, unspecified: Secondary | ICD-10-CM

## 2023-08-16 MED ORDER — APIXABAN 5 MG PO TABS
5.0000 mg | ORAL_TABLET | Freq: Two times a day (BID) | ORAL | 3 refills | Status: DC
  Filled 2023-08-16: qty 180, 90d supply, fill #0
  Filled 2023-11-23: qty 180, 90d supply, fill #1
  Filled 2024-03-24: qty 180, 90d supply, fill #2
  Filled 2024-06-30: qty 180, 90d supply, fill #3

## 2023-08-16 MED ORDER — POTASSIUM CHLORIDE CRYS ER 20 MEQ PO TBCR
20.0000 meq | EXTENDED_RELEASE_TABLET | Freq: Every day | ORAL | 2 refills | Status: DC
  Filled 2023-08-16: qty 90, 90d supply, fill #0
  Filled 2023-11-23: qty 90, 90d supply, fill #1

## 2023-08-16 MED ORDER — METOPROLOL SUCCINATE ER 25 MG PO TB24
25.0000 mg | ORAL_TABLET | Freq: Every day | ORAL | 3 refills | Status: DC
  Filled 2023-08-16: qty 90, 90d supply, fill #0
  Filled 2023-11-23: qty 90, 90d supply, fill #1
  Filled 2024-02-11 – 2024-03-10 (×2): qty 90, 90d supply, fill #2
  Filled 2024-05-23: qty 90, 90d supply, fill #3

## 2023-08-16 MED ORDER — FE BISGLY-VIT C-VIT B12-FA 28-60-0.008-0.4 MG PO CAPS
1.0000 | ORAL_CAPSULE | Freq: Every day | ORAL | Status: DC
Start: 2023-08-16 — End: 2024-01-03

## 2023-08-16 MED ORDER — TRIAMTERENE-HCTZ 37.5-25 MG PO TABS
1.0000 | ORAL_TABLET | Freq: Every day | ORAL | 2 refills | Status: DC
  Filled 2023-08-16: qty 90, 90d supply, fill #0
  Filled 2023-11-23: qty 90, 90d supply, fill #1

## 2023-08-16 NOTE — Progress Notes (Signed)
[facility] [address] [place], [place] [address]  Internal MEDICINE  Office Visit Note  Patient Name: Lauren Hensley  [id number]  [id number]  Date of Service: 08/16/2023  Chief Complaint  Patient presents with   Hypertension   Follow-up    HPI Lauren Hensley presents for a follow-up visit for hypertension, BLE edema, and atrial flutter. Hypertension -- controlled with current medications Bilateral lower extremity edema -- still much improved with current medication Seen by cardiology, diagnosed with Atrial flutter, just finished 14 day heart monitor and will see NP at cardiology next week to discuss. No medication changes were made, they approved of the initiation of eliquis and metoprolol and those are continued. Also was treated with cardioversion in late September by Dr. Azucena Cecil.   Current Medication: Outpatient Encounter Medications as of 08/16/2023  Medication Sig   [DISCONTINUED] apixaban (ELIQUIS) 5 MG TABS tablet Take 1 tablet (5 mg total) by mouth 2 (two) times daily.   [DISCONTINUED] Fe Bisgly-Vit C-Vit B12-FA 28-60-0.008-0.4 MG CAPS Take 90 tablets by mouth daily.   [DISCONTINUED] metoprolol succinate (TOPROL-XL) 25 MG 24 hr tablet Take 1 tablet (25 mg total) by mouth daily.   [DISCONTINUED] potassium chloride SA (KLOR-CON M) 20 MEQ tablet Take 1 tablet (20 mEq total) by mouth daily.   [DISCONTINUED] triamterene-hydrochlorothiazide (MAXZIDE-25) 37.5-25 MG tablet Take 1 tablet by mouth daily.   apixaban (ELIQUIS) 5 MG TABS tablet Take 1 tablet (5 mg total) by mouth 2 (two) times daily.   Fe Bisgly-Vit C-Vit B12-FA 28-60-0.008-0.4 MG CAPS Take 1 tablet by mouth daily.   metoprolol succinate (TOPROL-XL) 25 MG 24 hr tablet Take 1 tablet (25 mg total) by mouth daily.   potassium chloride SA (KLOR-CON M) 20 MEQ tablet Take 1 tablet (20 mEq total) by mouth daily.   triamterene-hydrochlorothiazide (MAXZIDE-25) 37.5-25 MG tablet Take 1 tablet by mouth daily.    [DISCONTINUED] medroxyPROGESTERone (DEPO-PROVERA) 150 MG/ML injection Inject 1 mL (150 mg total) into the muscle every 3 (three) months. (Patient not taking: Reported on 04/30/2021)   No facility-administered encounter medications on file as of 08/16/2023.    Surgical History: Past Surgical History:  Procedure Laterality Date   CARDIOVERSION N/A 06/28/2023   Procedure: CARDIOVERSION;  Surgeon: Debbe Odea, MD;  Location: [facility] ORS;  Service: Cardiovascular;  Laterality: N/A;   COLONOSCOPY WITH PROPOFOL N/A 02/08/2021   Procedure: COLONOSCOPY WITH PROPOFOL;  Surgeon: Toney Reil, MD;  Location: [facility] ENDOSCOPY;  Service: Gastroenterology;  Laterality: N/A;   EMBOLIZATION (CATH LAB) N/A 07/29/2021   Procedure: EMBOLIZATION;  Surgeon: Renford Dills, MD;  Location: [facility] INVASIVE CV LAB;  Service: Cardiovascular;  Laterality: N/A;   EMBOLIZATION (CATH LAB) N/A 09/02/2021   Procedure: EMBOLIZATION;  Surgeon: Renford Dills, MD;  Location: [facility] INVASIVE CV LAB;  Service: Cardiovascular;  Laterality: N/A;   Emobolization  09/02/2021   ESOPHAGOGASTRODUODENOSCOPY N/A 02/08/2021   Procedure: ESOPHAGOGASTRODUODENOSCOPY (EGD);  Surgeon: Toney Reil, MD;  Location: [facility] ENDOSCOPY;  Service: Gastroenterology;  Laterality: N/A;   GIVENS CAPSULE STUDY N/A 02/09/2021   Procedure: GIVENS CAPSULE STUDY;  Surgeon: Regis Bill, MD;  Location: [facility] ENDOSCOPY;  Service: Endoscopy;  Laterality: N/A;    Medical History: Past Medical History:  Diagnosis Date   Anemia    CHF (congestive heart failure) (HCC)    Headache 06/17/2022   Hypertension     Family History: Family History  Problem Relation Age of Onset   Heart disease Mother    Diabetes Mother    COPD  Mother    Heart Problems Mother    Emphysema Father    Hypercholesterolemia Brother    Diabetes Maternal Aunt    Obesity Maternal Aunt    Arthritis Maternal Aunt    Breast cancer Neg Hx     Social History    Socioeconomic History   Marital status: Single    Spouse name: Not on file   Number of children: 1   Years of education: Not on file   Highest education level: Not on file  Occupational History   Not on file  Tobacco Use   Smoking status: Never   Smokeless tobacco: Never  Vaping Use   Vaping status: Never Used  Substance and Sexual Activity   Alcohol use: Not Currently    Comment: 6-7 months ago   Drug use: Not Currently    Types: Marijuana, Cocaine   Sexual activity: Not Currently  Other Topics Concern   Not on file  Social History Narrative   Lives with brother currently and niece    Social Determinants of Health   Financial Resource Strain: High Risk (07/16/2022)   Overall Financial Resource Strain (CARDIA)    Difficulty of Paying Living Expenses: Very hard  Food Insecurity: No Food Insecurity (07/16/2022)   Hunger Vital Sign    Worried About Running Out of Food in the Last Year: Never true    Ran Out of Food in the Last Year: Never true  Transportation Needs: Unmet Transportation Needs (02/03/2023)   PRAPARE - Administrator, Civil Service (Medical): Yes    Lack of Transportation (Non-Medical): Yes  Physical Activity: Not on file  Stress: Not on file  Social Connections: Not on file  Intimate Partner Violence: Not At Risk (06/17/2022)   Humiliation, Afraid, Rape, and Kick questionnaire    Fear of Current or Ex-Partner: No    Emotionally Abused: No    Physically Abused: No    Sexually Abused: No      Review of Systems  Constitutional:  Positive for fatigue. Negative for chills and unexpected weight change.  HENT:  Negative for congestion, postnasal drip, rhinorrhea, sneezing and sore throat.   Eyes:  Negative for redness.  Respiratory:  Positive for shortness of breath (off and on). Negative for cough and chest tightness.   Cardiovascular:  Positive for leg swelling (significantly improved.). Negative for chest pain and palpitations.   Gastrointestinal:  Negative for abdominal pain, constipation, diarrhea, nausea and vomiting.  Genitourinary:  Negative for dysuria and frequency.  Musculoskeletal: Negative.  Negative for arthralgias, back pain, joint swelling and neck pain.  Skin:  Negative for rash.  Neurological:  Positive for numbness (and tingling in feet). Negative for tremors.  Hematological:  Negative for adenopathy. Does not bruise/bleed easily.  Psychiatric/Behavioral:  Negative for behavioral problems (Depression), sleep disturbance and suicidal ideas. The patient is not nervous/anxious.     Vital Signs: BP 130/80   Pulse 60 Comment: 57  Temp 98.1 F (36.7 C)   Resp 16   Ht 5\' 6"  (1.676 m)   Wt (!) 328 lb 3.2 oz (148.9 kg)   SpO2 97%   BMI 52.97 kg/m    Physical Exam Vitals reviewed.  Constitutional:      General: She is not in acute distress.    Appearance: Normal appearance. She is obese. She is not ill-appearing.  HENT:     Head: Normocephalic and atraumatic.  Eyes:     Pupils: Pupils are equal, round, and reactive to light.  Cardiovascular:     Rate and Rhythm: Normal rate. Rhythm irregularly irregular.     Heart sounds: Murmur heard.  Pulmonary:     Effort: Pulmonary effort is normal. No accessory muscle usage or respiratory distress.     Breath sounds: Normal breath sounds and air entry.  Musculoskeletal:     Right lower leg: 1+ Edema present.     Left lower leg: 1+ Edema present.  Neurological:     Mental Status: She is alert and oriented to person, place, and time.  Psychiatric:        Mood and Affect: Mood normal.        Behavior: Behavior normal.        Assessment/Plan: 1. Essential hypertension Stable, Continue current medications as prescribed, refills ordered  - metoprolol succinate (TOPROL-XL) 25 MG 24 hr tablet; Take 1 tablet (25 mg total) by mouth daily.  Dispense: 90 tablet; Refill: 3 - potassium chloride SA (KLOR-CON M) 20 MEQ tablet; Take 1 tablet (20 mEq total)  by mouth daily.  Dispense: 90 tablet; Refill: 2 - triamterene-hydrochlorothiazide (MAXZIDE-25) 37.5-25 MG tablet; Take 1 tablet by mouth daily.  Dispense: 90 tablet; Refill: 2  2. Bilateral lower extremity edema Stable, continue medications as prescribed, refills ordered - triamterene-hydrochlorothiazide (MAXZIDE-25) 37.5-25 MG tablet; Take 1 tablet by mouth daily.  Dispense: 90 tablet; Refill: 2  3. Atrial flutter, unspecified type (HCC) Continue medications as prescribed, also patient is being followed by cardiology - apixaban (ELIQUIS) 5 MG TABS tablet; Take 1 tablet (5 mg total) by mouth 2 (two) times daily.  Dispense: 180 tablet; Refill: 3 - metoprolol succinate (TOPROL-XL) 25 MG 24 hr tablet; Take 1 tablet (25 mg total) by mouth daily.  Dispense: 90 tablet; Refill: 3  4. Symptomatic anemia Continue iron supplement as prescribed by Dr. Donneta Romberg - Fe Bisgly-Vit C-Vit B12-FA 28-60-0.008-0.4 MG CAPS; Take 1 tablet by mouth daily.   General Counseling: Lauren Hensley understanding of the findings of todays visit and agrees with plan of treatment. I have discussed any further diagnostic evaluation that may be needed or ordered today. We also reviewed her medications today. she has been encouraged to call the office with any questions or concerns that should arise related to todays visit.    No orders of the defined types were placed in this encounter.   Meds ordered this encounter  Medications   Fe Bisgly-Vit C-Vit B12-FA 28-60-0.008-0.4 MG CAPS    Sig: Take 1 tablet by mouth daily.    Instructions edited due to incorrect dose   apixaban (ELIQUIS) 5 MG TABS tablet    Sig: Take 1 tablet (5 mg total) by mouth 2 (two) times daily.    Dispense:  180 tablet    Refill:  3   metoprolol succinate (TOPROL-XL) 25 MG 24 hr tablet    Sig: Take 1 tablet (25 mg total) by mouth daily.    Dispense:  90 tablet    Refill:  3   potassium chloride SA (KLOR-CON M) 20 MEQ tablet    Sig: Take 1  tablet (20 mEq total) by mouth daily.    Dispense:  90 tablet    Refill:  2   triamterene-hydrochlorothiazide (MAXZIDE-25) 37.5-25 MG tablet    Sig: Take 1 tablet by mouth daily.    Dispense:  90 tablet    Refill:  2    Return in about 3 months (around 11/16/2023) for F/U, Lauren Hensley PCP.   Total time spent:30 Minutes Time spent includes review of  chart, medications, test results, and follow up plan with the patient.    Controlled Substance Database was reviewed by me.  This patient was seen by Sallyanne Kuster, FNP-C in collaboration with Dr. Beverely Risen as a part of collaborative care agreement.   Aveya Beal R. Tedd Sias, MSN, FNP-C Internal medicine

## 2023-08-23 NOTE — Progress Notes (Unsigned)
Cardiology Clinic Note    Date:  08/24/2023  Patient ID:  Lauren Hensley, [place] [birth year], MRN [id number] PCP:  Sallyanne Kuster, NP  Cardiologist:  Debbe Odea, MD Electrophysiologist: Lanier Prude, MD     Patient Profile    Chief Complaint: atrial flutter follow-up  History of Present Illness: Lauren Hensley is a 57 y.o. female with PMH notable for parox AFib, aflutter, HTN; seen today for Lanier Prude, MD for routine electrophysiology followup.  She is s/p unsuccessful DCCV on 9/30. She last saw Dr. Lalla Brothers 06/2023 for initial consult of her atrial flutter.  She self-converted to sinus rhythm at some point. She had SOB with fleeting chest pain, unclear if symptoms related to heart rhythm so two week monitor was ordered. Not invasive EP procedure candidate d/t BMI.  On follow-up today, she continues to have brief palpitation episodes that are very short, like a light switch flicking. She continues to have SOB with activity, like sweeping and moping. SOB is relieved by resting. Her lower extremity edema is improved as of late, she has noticed that she can walk further now that her legs do not ache.  She continues to take eliquis BID, no missed doses, no bleeding concerns.  She denies chest pain, chest pressure. Weight has overall been decreasing as she has increased her physical activity.   AAD History: None     ROS:  Please see the history of present illness. All other systems are reviewed and otherwise negative.    Physical Exam    VS:  BP 114/70 (BP Location: Left Wrist, Patient Position: Sitting, Cuff Size: Large)   Pulse (!) 52   Ht 5\' 8"  (1.727 m)   Wt (!) 324 lb 4 oz (147.1 kg)   SpO2 98%   BMI 49.30 kg/m  BMI: Body mass index is 49.3 kg/m.  Wt Readings from Last 3 Encounters:  08/24/23 (!) 324 lb 4 oz (147.1 kg)  08/16/23 (!) 328 lb 3.2 oz (148.9 kg)  08/05/23 (!) 324 lb (147 kg)     GEN- The patient is well appearing, alert and oriented x 3  today.   Lungs- Clear to ausculation bilaterally, normal work of breathing.  Heart- Regular rate and rhythm, no murmurs, rubs or gallops Extremities- 1+ R>L peripheral edema, warm, dry    Studies Reviewed   Previous EP, cardiology notes.    EKG is ordered. Personal review of EKG from today shows:    EKG Interpretation Date/Time:  Tuesday August 24 2023 11:19:22 EST Ventricular Rate:  52 PR Interval:  154 QRS Duration:  94 QT Interval:  460 QTC Calculation: 427 R Axis:   0  Text Interpretation: Sinus bradycardia Nonspecific T wave abnormality Confirmed by Sherie Don [phone]) on 08/24/2023 11:22:14 AM    Long term monitor, 08/10/2023 HR 37 - 139 bpm, average 53 bpm. 122 SVT episodes, longest 52.1 seconds. Rhythm strip suggests atrial tachycardia. Rare supraventricular and ventricular ectopy. No atrial fibrillation.  TTE, 07/16/2022  1. Left ventricular ejection fraction, by estimation, is 55 to 60%. The left ventricle has normal function. The left ventricle has no regional wall motion abnormalities. There is mild left ventricular hypertrophy. Left ventricular diastolic parameters were normal.   2. Right ventricular systolic function is normal. The right ventricular size is normal. There is normal pulmonary artery systolic pressure. The estimated right ventricular systolic pressure is 27.3 mmHg.   3. The mitral valve is normal in structure. No evidence of mitral valve regurgitation. No  evidence of mitral stenosis.   4. Tricuspid valve regurgitation is moderate.   5. The aortic valve is tricuspid. Aortic valve regurgitation is not visualized. No aortic stenosis is present.   6. The inferior vena cava is normal in size with greater than 50% respiratory variability, suggesting right atrial pressure of 3 mmHg.      Assessment and Plan     #) aflutter #) SVT Recent zio without atrial flutter, triggered events correspond to sinus rhythm Continue 25mg  toprol daily   #)  Hypercoag d/t parox afib CHA2DS2-VASc Score = 2 [CHF History: 1, HTN History: 0, Diabetes History: 0, Stroke History: 0, Vascular Disease History: 0, Age Score: 0, Gender Score: 1].  Therefore, the patient's annual risk of stroke is 2.2 %.    Stroke ppx - 5mg  eliquis BID, appropriately dosed No bleeding concerns   #) HTN At goal today.  Recommend checking blood pressures 1-2 times per week at home and recording the values.  Recommend bringing these recordings to the primary care physician. Encouraged physical activity including walking and reducing sodium in diet.  Continue maxzide-25          Current medicines are reviewed at length with the patient today.   The patient does not have concerns regarding her medicines.  The following changes were made today:  none  Labs/ tests ordered today include:  Orders Placed This Encounter  Procedures   EKG 12-Lead     Disposition: Follow up with Dr. Lalla Brothers or EP APP in 6 months   Signed, Sherie Don, NP  08/24/23  12:01 PM  Electrophysiology [facility]

## 2023-08-24 ENCOUNTER — Ambulatory Visit: Payer: Medicaid Other | Attending: Cardiology | Admitting: Cardiology

## 2023-08-24 ENCOUNTER — Encounter: Payer: Self-pay | Admitting: Cardiology

## 2023-08-24 VITALS — BP 114/70 | HR 52 | Ht 68.0 in | Wt 324.2 lb

## 2023-08-24 DIAGNOSIS — I10 Essential (primary) hypertension: Secondary | ICD-10-CM

## 2023-08-24 DIAGNOSIS — D6869 Other thrombophilia: Secondary | ICD-10-CM

## 2023-08-24 DIAGNOSIS — I4892 Unspecified atrial flutter: Secondary | ICD-10-CM | POA: Diagnosis not present

## 2023-08-24 NOTE — Patient Instructions (Signed)
Medication Instructions:  Your Physician recommend you continue on your current medication as directed.    *If you need a refill on your cardiac medications before your next appointment, please call your pharmacy*   Lab Work: None ordered today.   If you have labs (blood work) drawn today and your tests are completely normal, you will receive your results only by: MyChart Message (if you have MyChart) OR A paper copy in the mail If you have any lab test that is abnormal or we need to change your treatment, we will call you to review the results.   Testing/Procedures: None ordered today.    Follow-Up: At [facility], you and your health needs are our priority.  As part of our continuing mission to provide you with exceptional heart care, we have created designated Provider Care Teams.  These Care Teams include your primary Cardiologist (physician) and Advanced Practice Providers (APPs -  Physician Assistants and Nurse Practitioners) who all work together to provide you with the care you need, when you need it.  We recommend signing up for the patient portal called "MyChart".  Sign up information is provided on this After Visit Summary.  MyChart is used to connect with patients for Virtual Visits (Telemedicine).  Patients are able to view lab/test results, encounter notes, upcoming appointments, etc.  Non-urgent messages can be sent to your provider as well.   To learn more about what you can do with MyChart, go to [email or web address].    Your next appointment:   6 month(s)  Provider:   Steffanie Dunn, MD or Sherie Don, NP

## 2023-08-30 ENCOUNTER — Ambulatory Visit: Payer: Medicaid Other | Admitting: Cardiology

## 2023-11-16 ENCOUNTER — Encounter: Payer: Medicaid Other | Admitting: Nurse Practitioner

## 2023-11-23 ENCOUNTER — Ambulatory Visit: Payer: Medicaid Other | Admitting: Cardiology

## 2024-01-03 ENCOUNTER — Inpatient Hospital Stay (HOSPITAL_BASED_OUTPATIENT_CLINIC_OR_DEPARTMENT_OTHER): Payer: Medicaid Other | Admitting: Internal Medicine

## 2024-01-03 ENCOUNTER — Inpatient Hospital Stay: Payer: Medicaid Other | Attending: Internal Medicine

## 2024-01-03 ENCOUNTER — Inpatient Hospital Stay: Payer: Medicaid Other

## 2024-01-03 ENCOUNTER — Encounter: Payer: Self-pay | Admitting: Internal Medicine

## 2024-01-03 VITALS — BP 133/74 | HR 51 | Temp 95.9°F | Resp 16 | Ht 68.0 in | Wt 332.0 lb

## 2024-01-03 DIAGNOSIS — N920 Excessive and frequent menstruation with regular cycle: Secondary | ICD-10-CM | POA: Diagnosis not present

## 2024-01-03 DIAGNOSIS — I509 Heart failure, unspecified: Secondary | ICD-10-CM | POA: Diagnosis not present

## 2024-01-03 DIAGNOSIS — D649 Anemia, unspecified: Secondary | ICD-10-CM

## 2024-01-03 DIAGNOSIS — R5383 Other fatigue: Secondary | ICD-10-CM | POA: Insufficient documentation

## 2024-01-03 DIAGNOSIS — D500 Iron deficiency anemia secondary to blood loss (chronic): Secondary | ICD-10-CM

## 2024-01-03 DIAGNOSIS — I4892 Unspecified atrial flutter: Secondary | ICD-10-CM | POA: Insufficient documentation

## 2024-01-03 LAB — VITAMIN B12: Vitamin B-12: 324 pg/mL (ref 180–914)

## 2024-01-03 LAB — COMPREHENSIVE METABOLIC PANEL WITH GFR
ALT: 10 U/L (ref 0–44)
AST: 17 U/L (ref 15–41)
Albumin: 3.8 g/dL (ref 3.5–5.0)
Alkaline Phosphatase: 37 U/L — ABNORMAL LOW (ref 38–126)
Anion gap: 5 (ref 5–15)
BUN: 17 mg/dL (ref 6–20)
CO2: 24 mmol/L (ref 22–32)
Calcium: 8.9 mg/dL (ref 8.9–10.3)
Chloride: 102 mmol/L (ref 98–111)
Creatinine, Ser: 0.94 mg/dL (ref 0.44–1.00)
GFR, Estimated: >60 mL/min (ref >60)
Glucose, Bld: 84 mg/dL (ref 70–99)
Potassium: 3.8 mmol/L (ref 3.5–5.1)
Sodium: 131 mmol/L — ABNORMAL LOW (ref 135–145)
Total Bilirubin: 0.7 mg/dL (ref 0.0–1.2)
Total Protein: 7.7 g/dL (ref 6.5–8.1)

## 2024-01-03 LAB — CBC WITH DIFFERENTIAL/PLATELET
Abs Immature Granulocytes: 0.02 10*3/uL (ref 0.00–0.07)
Basophils Absolute: 0 10*3/uL (ref 0.0–0.1)
Basophils Relative: 1 %
Eosinophils Absolute: 0.1 10*3/uL (ref 0.0–0.5)
Eosinophils Relative: 1 %
HCT: 37.8 % (ref 36.0–46.0)
Hemoglobin: 11.7 g/dL — ABNORMAL LOW (ref 12.0–15.0)
Immature Granulocytes: 0 %
Lymphocytes Relative: 36 %
Lymphs Abs: 1.6 10*3/uL (ref 0.7–4.0)
MCH: 22.8 pg — ABNORMAL LOW (ref 26.0–34.0)
MCHC: 31 g/dL (ref 30.0–36.0)
MCV: 73.7 fL — ABNORMAL LOW (ref 80.0–100.0)
Monocytes Absolute: 0.4 10*3/uL (ref 0.1–1.0)
Monocytes Relative: 9 %
Neutro Abs: 2.4 10*3/uL (ref 1.7–7.7)
Neutrophils Relative %: 53 %
Platelets: 168 10*3/uL (ref 150–400)
RBC: 5.13 MIL/uL — ABNORMAL HIGH (ref 3.87–5.11)
RDW: 15 % (ref 11.5–15.5)
WBC: 4.5 10*3/uL (ref 4.0–10.5)
nRBC: 0 % (ref 0.0–0.2)

## 2024-01-03 LAB — FERRITIN: Ferritin: 11 ng/mL (ref 11–307)

## 2024-01-03 LAB — IRON AND TIBC
Iron: 86 ug/dL (ref 28–170)
Saturation Ratios: 23 % (ref 10.4–31.8)
TIBC: 372 ug/dL (ref 250–450)
UIBC: 286 ug/dL

## 2024-01-03 MED ORDER — FE BISGLY-VIT C-VIT B12-FA 28-60-0.008-0.4 MG PO CAPS
1.0000 | ORAL_CAPSULE | Freq: Every day | ORAL | 3 refills | Status: DC
  Filled 2024-01-06: qty 90, 90d supply, fill #0
  Filled 2024-03-10 – 2024-03-24 (×2): qty 90, 90d supply, fill #1
  Filled 2024-06-30: qty 90, 90d supply, fill #2

## 2024-01-03 NOTE — Progress Notes (Signed)
 Fatigue/weakness: YES Dyspena: YES Light headedness: NO Blood in stool: NO   Asking for refill of the Fe Bisgly, pended.

## 2024-01-03 NOTE — Progress Notes (Signed)
 [facility] OFFICE PROGRESS NOTE  Patient Care Team: Sallyanne Kuster, NP as PCP - General (Nurse Practitioner) Debbe Odea, MD as PCP - Cardiology (Cardiology) Lanier Prude, MD as PCP - Electrophysiology (Cardiology) Jim Like, RN as Registered Nurse Scarlett Presto, RN (Inactive) as Registered Nurse Earna Coder, MD as Consulting Physician (Oncology)   Cancer Staging  No matching staging information was found for the patient.    Oncology History   No history exists.   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   #Heavy menstrual cycles-fibroids [Dr Shuman]; DEC 2022- s/p uterine embolization.    INTERVAL HISTORY: Ambulating independently.  Alone.  Lauren Hensley 58 y.o.  female pleasant patient history of iron deficient anemia/beta thalassemia-other history of fibroids/heavy menstrual cycle currently status post ablation is here for follow-up.  Patient denies any worsening fatigue.  Denies any shortness of breath or cough.  States to have regular menstrual cycles.  However not heavy.  Patient  compliant with PO iron.  Review of Systems  Constitutional:  Positive for malaise/fatigue. Negative for chills, diaphoresis, fever and weight loss.  HENT:  Negative for nosebleeds and sore throat.   Eyes:  Negative for double vision.  Respiratory:  Negative for cough, hemoptysis, sputum production, shortness of breath and wheezing.   Cardiovascular:  Negative for chest pain, palpitations, orthopnea and leg swelling.  Gastrointestinal:  Negative for abdominal pain, blood in stool, constipation, diarrhea, heartburn, melena, nausea and vomiting.  Genitourinary:  Negative for dysuria, frequency and urgency.  Musculoskeletal:  Positive for joint pain. Negative for back pain.  Skin: Negative.  Negative for itching and rash.  Neurological:  Positive for dizziness. Negative for  tingling, focal weakness, weakness and headaches.  Endo/Heme/Allergies:  Does not bruise/bleed easily.  Psychiatric/Behavioral:  Negative for depression. The patient is not nervous/anxious and does not have insomnia.       PAST MEDICAL HISTORY :  Past Medical History:  Diagnosis Date   Anemia    CHF (congestive heart failure) (HCC)    Headache 06/17/2022   Hypertension     PAST SURGICAL HISTORY :   Past Surgical History:  Procedure Laterality Date   CARDIOVERSION N/A 06/28/2023   Procedure: CARDIOVERSION;  Surgeon: Debbe Odea, MD;  Location: [facility] ORS;  Service: Cardiovascular;  Laterality: N/A;   COLONOSCOPY WITH PROPOFOL N/A 02/08/2021   Procedure: COLONOSCOPY WITH PROPOFOL;  Surgeon: Toney Reil, MD;  Location: [facility] ENDOSCOPY;  Service: Gastroenterology;  Laterality: N/A;   EMBOLIZATION (CATH LAB) N/A 07/29/2021   Procedure: EMBOLIZATION;  Surgeon: Renford Dills, MD;  Location: [facility] INVASIVE CV LAB;  Service: Cardiovascular;  Laterality: N/A;   EMBOLIZATION (CATH LAB) N/A 09/02/2021   Procedure: EMBOLIZATION;  Surgeon: Renford Dills, MD;  Location: [facility] INVASIVE CV LAB;  Service: Cardiovascular;  Laterality: N/A;   Emobolization  09/02/2021   ESOPHAGOGASTRODUODENOSCOPY N/A 02/08/2021   Procedure: ESOPHAGOGASTRODUODENOSCOPY (EGD);  Surgeon: Toney Reil, MD;  Location: [facility] ENDOSCOPY;  Service: Gastroenterology;  Laterality: N/A;   GIVENS CAPSULE STUDY N/A 02/09/2021   Procedure: GIVENS CAPSULE STUDY;  Surgeon: Regis Bill, MD;  Location: [facility] ENDOSCOPY;  Service: Endoscopy;  Laterality: N/A;    FAMILY HISTORY :   Family History  Problem Relation Age of Onset   Heart disease Mother    Diabetes Mother    COPD Mother    Heart Problems Mother    Emphysema Father    Hypercholesterolemia Brother  Diabetes Maternal Aunt    Obesity Maternal Aunt    Arthritis Maternal Aunt    Breast cancer Neg Hx     SOCIAL HISTORY:   Social History    Tobacco Use   Smoking status: Never   Smokeless tobacco: Never  Vaping Use   Vaping status: Never Used  Substance Use Topics   Alcohol use: Not Currently    Comment: 6-7 months ago   Drug use: Not Currently    Types: Marijuana, Cocaine    ALLERGIES:  has no known allergies.  MEDICATIONS:  Current Outpatient Medications  Medication Sig Dispense Refill   apixaban (ELIQUIS) 5 MG TABS tablet Take 1 tablet (5 mg total) by mouth 2 (two) times daily. 180 tablet 3   metoprolol succinate (TOPROL-XL) 25 MG 24 hr tablet Take 1 tablet (25 mg total) by mouth daily. 90 tablet 3   potassium chloride SA (KLOR-CON M) 20 MEQ tablet Take 1 tablet (20 mEq total) by mouth daily. 90 tablet 2   triamterene-hydrochlorothiazide (MAXZIDE-25) 37.5-25 MG tablet Take 1 tablet by mouth daily. 90 tablet 2   Fe Bisgly-Vit C-Vit B12-FA 28-60-0.008-0.4 MG CAPS Take 1 tablet by mouth daily. 90 capsule 3   No current facility-administered medications for this visit.    PHYSICAL EXAMINATION:  BP 133/74 (BP Location: Right Arm, Patient Position: Sitting, Cuff Size: Normal)   Pulse (!) 51   Temp (!) 95.9 F (35.5 C) (Tympanic)   Resp 16   Ht 5\' 8"  (1.727 m)   Wt (!) 332 lb (150.6 kg)   SpO2 100%   BMI 50.48 kg/m   Filed Weights   01/03/24 1528  Weight: (!) 332 lb (150.6 kg)     Physical Exam Vitals and nursing note reviewed.  HENT:     Head: Normocephalic and atraumatic.     Mouth/Throat:     Pharynx: Oropharynx is clear.  Eyes:     Extraocular Movements: Extraocular movements intact.     Pupils: Pupils are equal, round, and reactive to light.  Cardiovascular:     Rate and Rhythm: Normal rate and regular rhythm.  Pulmonary:     Comments: Decreased breath sounds bilaterally.  Abdominal:     Palpations: Abdomen is soft.  Musculoskeletal:        General: Normal range of motion.     Cervical back: Normal range of motion.  Skin:    General: Skin is warm.  Neurological:     General: No focal  deficit present.     Mental Status: She is alert and oriented to person, place, and time.  Psychiatric:        Behavior: Behavior normal.        Judgment: Judgment normal.        LABORATORY DATA:  I have reviewed the data as listed    Component Value Date/Time   NA 131 (L) 01/03/2024 1513   NA 142 06/24/2023 1230   K 3.8 01/03/2024 1513   CL 102 01/03/2024 1513   CO2 24 01/03/2024 1513   GLUCOSE 84 01/03/2024 1513   BUN 17 01/03/2024 1513   BUN 15 06/24/2023 1230   CREATININE 0.94 01/03/2024 1513   CREATININE 0.99 08/05/2023 1337   CALCIUM 8.9 01/03/2024 1513   PROT 7.7 01/03/2024 1513   ALBUMIN 3.8 01/03/2024 1513   AST 17 01/03/2024 1513   ALT 10 01/03/2024 1513   ALKPHOS 37 (L) 01/03/2024 1513   BILITOT 0.7 01/03/2024 1513   GFRNONAA >60 01/03/2024 1513  GFRNONAA >60 08/05/2023 1337    No results found for: "SPEP", "UPEP"  Lab Results  Component Value Date   WBC 4.5 01/03/2024   NEUTROABS 2.4 01/03/2024   HGB 11.7 (L) 01/03/2024   HCT 37.8 01/03/2024   MCV 73.7 (L) 01/03/2024   PLT 168 01/03/2024      Chemistry      Component Value Date/Time   NA 131 (L) 01/03/2024 1513   NA 142 06/24/2023 1230   K 3.8 01/03/2024 1513   CL 102 01/03/2024 1513   CO2 24 01/03/2024 1513   BUN 17 01/03/2024 1513   BUN 15 06/24/2023 1230   CREATININE 0.94 01/03/2024 1513   CREATININE 0.99 08/05/2023 1337      Component Value Date/Time   CALCIUM 8.9 01/03/2024 1513   ALKPHOS 37 (L) 01/03/2024 1513   AST 17 01/03/2024 1513   ALT 10 01/03/2024 1513   BILITOT 0.7 01/03/2024 1513       RADIOGRAPHIC STUDIES: I have personally reviewed the radiological images as listed and agreed with the findings in the report. No results found.   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 S/p IV Venofer.     # Today hemoglobin is 11.7-  HOLD venofer today.  Will refill oral pills.    # Menorrhagia-fibroids s/p gynecology evaluation. S/p partial embolization left & right uterine artery  [Dr.Dew]-DC 2022]- stable.    # IV access: POOR discussed continue hydration.  We will have to consider either port placement if patient needs ongoing labs/infusions.  Patient understands need for hydration prior to IV access.  needs transportation # DISPOSITION # HOLD venofer today  # follow up in 6 months- MD; labs- cbc;bmp; iron studies;ferritin; possible- venofer- Dr.B    Orders Placed This Encounter  Procedures   CBC with Differential (Cancer Center Only)    Standing Status:   Future    Expected Date:   07/04/2024    Expiration Date:   01/02/2025   Basic metabolic panel with GFR    Standing Status:   Future    Expected Date:   07/04/2024    Expiration Date:   01/02/2025   Iron and TIBC    Standing Status:   Future    Expected Date:   07/04/2024    Expiration Date:   01/02/2025   Ferritin    Standing Status:   Future    Expected Date:   07/04/2024    Expiration Date:   01/02/2025   All questions were answered. The patient knows to call the clinic with any problems, questions or concerns.      Earna Coder, MD 01/03/2024 4:01 PM

## 2024-01-03 NOTE — Assessment & Plan Note (Signed)
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 S/p IV Venofer.    # Today hemoglobin is 11.7-  HOLD venofer today.  Will refill oral pills.    # Menorrhagia-fibroids s/p gynecology evaluation. S/p partial embolization left & right uterine artery  [Dr.Dew]-DC 2022]- stable.    # IV access: POOR discussed continue hydration.  We will have to consider either port placement if patient needs ongoing labs/infusions.  Patient understands need for hydration prior to IV access.  needs transportation # DISPOSITION # HOLD venofer today  # follow up in 6 months- MD; labs- cbc;bmp; iron studies;ferritin; possible- venofer- Dr.B

## 2024-01-06 ENCOUNTER — Encounter: Payer: Self-pay | Admitting: Internal Medicine

## 2024-01-06 ENCOUNTER — Other Ambulatory Visit: Payer: Self-pay

## 2024-01-07 ENCOUNTER — Other Ambulatory Visit: Payer: Self-pay

## 2024-02-04 ENCOUNTER — Ambulatory Visit: Attending: Cardiology | Admitting: Cardiology

## 2024-02-04 VITALS — BP 118/86 | HR 70 | Ht 68.0 in | Wt 328.0 lb

## 2024-02-04 DIAGNOSIS — I4892 Unspecified atrial flutter: Secondary | ICD-10-CM | POA: Diagnosis present

## 2024-02-04 DIAGNOSIS — I10 Essential (primary) hypertension: Secondary | ICD-10-CM | POA: Diagnosis present

## 2024-02-04 NOTE — Patient Instructions (Signed)

## 2024-02-04 NOTE — Progress Notes (Signed)
 Cardiology Office Note:    Date:  02/04/2024   ID:  Lauren Hensley, DOB 1965-11-25, MRN [id number]  PCP:  Laurence Pons, NP   [facility] Providers Cardiologist:  Constancia Delton, MD Electrophysiologist:  Boyce Byes, MD     Referring MD: Laurence Pons, NP   Chief Complaint  Patient presents with   4 mo follow up    No issues as of late. Feel some flutters here and there. Overall ok    History of Present Illness:    Lauren Hensley is a 58 y.o. female with a hx of hypertension, A-fib/atrial flutter s/p DC cardioversion on 9/24, morbid obesity who presents for follow-up.  Previously seen due to A-fib/flutter.  Underwent DC cardioversion successfully.  Has maintained sinus rhythm since.  States having rare palpitations.  Denies any dizziness.  Follow-up EKGs have revealed sinus rhythm.  Blood pressure is adequately controlled.  Tolerating Eliquis  with no bleeding issues.  Overall doing okay with no concerns at this time.  Prior notes/testing Cardiac monitor 07/2023 no A-flutter or atrial fibrillation Echocardiogram 10/23 EF 55 to 60%  Past Medical History:  Diagnosis Date   Anemia    CHF (congestive heart failure) (HCC)    Headache 06/17/2022   Hypertension     Past Surgical History:  Procedure Laterality Date   CARDIOVERSION N/A 06/28/2023   Procedure: CARDIOVERSION;  Surgeon: Constancia Delton, MD;  Location: [facility] ORS;  Service: Cardiovascular;  Laterality: N/A;   COLONOSCOPY WITH PROPOFOL  N/A 02/08/2021   Procedure: COLONOSCOPY WITH PROPOFOL ;  Surgeon: Selena Daily, MD;  Location: [facility] ENDOSCOPY;  Service: Gastroenterology;  Laterality: N/A;   EMBOLIZATION (CATH LAB) N/A 07/29/2021   Procedure: EMBOLIZATION;  Surgeon: Jackquelyn Mass, MD;  Location: [facility] INVASIVE CV LAB;  Service: Cardiovascular;  Laterality: N/A;   EMBOLIZATION (CATH LAB) N/A 09/02/2021   Procedure: EMBOLIZATION;  Surgeon: Jackquelyn Mass, MD;  Location: [facility] INVASIVE  CV LAB;  Service: Cardiovascular;  Laterality: N/A;   Emobolization  09/02/2021   ESOPHAGOGASTRODUODENOSCOPY N/A 02/08/2021   Procedure: ESOPHAGOGASTRODUODENOSCOPY (EGD);  Surgeon: Selena Daily, MD;  Location: [facility] ENDOSCOPY;  Service: Gastroenterology;  Laterality: N/A;   GIVENS CAPSULE STUDY N/A 02/09/2021   Procedure: GIVENS CAPSULE STUDY;  Surgeon: Shane Darling, MD;  Location: [facility] ENDOSCOPY;  Service: Endoscopy;  Laterality: N/A;    Current Medications: Current Meds  Medication Sig   apixaban  (ELIQUIS ) 5 MG TABS tablet Take 1 tablet (5 mg total) by mouth 2 (two) times daily.   Fe Bisgly-Vit C-Vit B12-FA 28-60-0.008-0.4 MG CAPS Take 1 tablet by mouth daily.   metoprolol  succinate (TOPROL -XL) 25 MG 24 hr tablet Take 1 tablet (25 mg total) by mouth daily.   potassium chloride  SA (KLOR-CON  M) 20 MEQ tablet Take 1 tablet (20 mEq total) by mouth daily.   triamterene -hydrochlorothiazide  (MAXZIDE -25) 37.5-25 MG tablet Take 1 tablet by mouth daily.     Allergies:   Patient has no known allergies.   Social History   Socioeconomic History   Marital status: Single    Spouse name: Not on file   Number of children: 1   Years of education: Not on file   Highest education level: Not on file  Occupational History   Not on file  Tobacco Use   Smoking status: Never   Smokeless tobacco: Never  Vaping Use   Vaping status: Never Used  Substance and Sexual Activity   Alcohol use: Not Currently    Comment: 6-7 months ago   Drug  use: Not Currently    Types: Marijuana, Cocaine   Sexual activity: Not Currently  Other Topics Concern   Not on file  Social History Narrative   Lives with brother currently and niece    Social Drivers of Health   Financial Resource Strain: High Risk (07/16/2022)   Overall Financial Resource Strain (CARDIA)    Difficulty of Paying Living Expenses: Very hard  Food Insecurity: No Food Insecurity (07/16/2022)   Hunger Vital Sign    Worried About  Running Out of Food in the Last Year: Never true    Ran Out of Food in the Last Year: Never true  Transportation Needs: Unmet Transportation Needs (02/03/2023)   PRAPARE - Administrator, Civil Service (Medical): Yes    Lack of Transportation (Non-Medical): Yes  Physical Activity: Not on file  Stress: Not on file  Social Connections: Not on file     Family History: The patient's family history includes Arthritis in her maternal aunt; COPD in her mother; Diabetes in her maternal aunt and mother; Emphysema in her father; Heart Problems in her mother; Heart disease in her mother; Hypercholesterolemia in her brother; Obesity in her maternal aunt. There is no history of Breast cancer.  ROS:   Please see the history of present illness.     All other systems reviewed and are negative.  EKGs/Labs/Other Studies Reviewed:    The following studies were reviewed today:  EKG Interpretation Date/Time:  Friday Feb 04 2024 11:31:17 EDT Ventricular Rate:  78 PR Interval:  98 QRS Duration:  82 QT Interval:  372 QTC Calculation: 424 R Axis:   -14  Text Interpretation: Sinus rhythm with marked sinus arrhythmia with short PR Nonspecific ST abnormality Confirmed by Constancia Delton ([id number]) on 02/04/2024 11:59:06 AM    Recent Labs: 01/03/2024: ALT 10; BUN 17; Creatinine, Ser 0.94; Hemoglobin 11.7; Platelets 168; Potassium 3.8; Sodium 131  Recent Lipid Panel No results found for: "CHOL", "TRIG", "HDL", "CHOLHDL", "VLDL", "LDLCALC", "LDLDIRECT"   Risk Assessment/Calculations:             Physical Exam:    VS:  BP 118/86 (BP Location: Left Arm, Patient Position: Sitting, Cuff Size: Normal)   Pulse 70   Ht 5\' 8"  (1.727 m)   Wt (!) 328 lb (148.8 kg)   SpO2 99%   BMI 49.87 kg/m     Wt Readings from Last 3 Encounters:  02/04/24 (!) 328 lb (148.8 kg)  01/03/24 (!) 332 lb (150.6 kg)  08/24/23 (!) 324 lb 4 oz (147.1 kg)     GEN:  Well nourished, well developed in no acute  distress HEENT: Normal NECK: No JVD; No carotid bruits CARDIAC: RRR, no murmurs, rubs, gallops RESPIRATORY:  Clear to auscultation without rales, wheezing or rhonchi  ABDOMEN: Soft, non-tender, non-distended MUSCULOSKELETAL:  No edema; No deformity  SKIN: Warm and dry NEUROLOGIC:  Alert and oriented x 3 PSYCHIATRIC:  Normal affect   ASSESSMENT:    1. Atrial flutter, unspecified type (HCC)   2. Primary hypertension   3. Morbid obesity (HCC)    PLAN:    In order of problems listed above:  Atrial flutter, s/p cardioversion 9/24.  Current sinus rhythm. Echo 10/23 EF 55 to 60% continue Toprol -XL 25 mg daily, Eliquis  5 mg twice daily.  Hypertension, BP controlled.  Continue Toprol -XL 25 mg daily, Maxzide . Morbid obesity, low-calorie diet, weight loss advised.  Follow-up in 1 year         Medication Adjustments/Labs and Tests  Ordered: Current medicines are reviewed at length with the patient today.  Concerns regarding medicines are outlined above.  Orders Placed This Encounter  Procedures   EKG 12-Lead   No orders of the defined types were placed in this encounter.   Patient Instructions  Medication Instructions:  Your Physician recommend you continue on your current medication as directed.    *If you need a refill on your cardiac medications before your next appointment, please call your pharmacy*  Lab Work: No labs ordered today  If you have labs (blood work) drawn today and your tests are completely normal, you will receive your results only by: MyChart Message (if you have MyChart) OR A paper copy in the mail If you have any lab test that is abnormal or we need to change your treatment, we will call you to review the results.  Testing/Procedures: No test ordered today   Follow-Up: At [facility], you and your health needs are our priority.  As part of our continuing mission to provide you with exceptional heart care, our providers are all part of one team.   This team includes your primary Cardiologist (physician) and Advanced Practice Providers or APPs (Physician Assistants and Nurse Practitioners) who all work together to provide you with the care you need, when you need it.  Your next appointment:   1 year(s)  Provider:   You may see Constancia Delton, MD or one of the following Advanced Practice Providers on your designated Care Team:   Laneta Pintos, NP Gildardo Labrador, PA-C Varney Gentleman, PA-C Cadence Makanda, PA-C Ronald Cockayne, NP Morey Ar, NP    We recommend signing up for the patient portal called "MyChart".  Sign up information is provided on this After Visit Summary.  MyChart is used to connect with patients for Virtual Visits (Telemedicine).  Patients are able to view lab/test results, encounter notes, upcoming appointments, etc.  Non-urgent messages can be sent to your provider as well.   To learn more about what you can do with MyChart, go to [email or web address].         Signed, Constancia Delton, MD  02/04/2024 12:29 PM    [facility]

## 2024-02-11 ENCOUNTER — Other Ambulatory Visit: Payer: Self-pay

## 2024-02-11 ENCOUNTER — Ambulatory Visit (INDEPENDENT_AMBULATORY_CARE_PROVIDER_SITE_OTHER): Admitting: Nurse Practitioner

## 2024-02-11 ENCOUNTER — Encounter: Payer: Self-pay | Admitting: Nurse Practitioner

## 2024-02-11 VITALS — BP 126/80 | HR 86 | Temp 98.2°F | Resp 16 | Ht 68.0 in | Wt 330.2 lb

## 2024-02-11 DIAGNOSIS — Z23 Encounter for immunization: Secondary | ICD-10-CM

## 2024-02-11 DIAGNOSIS — R600 Localized edema: Secondary | ICD-10-CM | POA: Diagnosis not present

## 2024-02-11 DIAGNOSIS — I10 Essential (primary) hypertension: Secondary | ICD-10-CM | POA: Diagnosis not present

## 2024-02-11 DIAGNOSIS — Z0001 Encounter for general adult medical examination with abnormal findings: Secondary | ICD-10-CM | POA: Diagnosis not present

## 2024-02-11 DIAGNOSIS — Z1231 Encounter for screening mammogram for malignant neoplasm of breast: Secondary | ICD-10-CM

## 2024-02-11 DIAGNOSIS — E782 Mixed hyperlipidemia: Secondary | ICD-10-CM | POA: Diagnosis not present

## 2024-02-11 DIAGNOSIS — E538 Deficiency of other specified B group vitamins: Secondary | ICD-10-CM | POA: Diagnosis not present

## 2024-02-11 DIAGNOSIS — E559 Vitamin D deficiency, unspecified: Secondary | ICD-10-CM

## 2024-02-11 MED ORDER — POTASSIUM CHLORIDE CRYS ER 20 MEQ PO TBCR
20.0000 meq | EXTENDED_RELEASE_TABLET | Freq: Every day | ORAL | 2 refills | Status: DC
  Filled 2024-02-11: qty 90, 90d supply, fill #0
  Filled 2024-05-23: qty 90, 90d supply, fill #1
  Filled 2024-08-10: qty 90, 90d supply, fill #2

## 2024-02-11 MED ORDER — TRIAMTERENE-HCTZ 37.5-25 MG PO TABS
1.0000 | ORAL_TABLET | Freq: Every day | ORAL | 2 refills | Status: DC
Start: 2024-02-11 — End: 2024-08-15
  Filled 2024-02-11: qty 90, 90d supply, fill #0
  Filled 2024-05-23: qty 90, 90d supply, fill #1
  Filled 2024-08-10: qty 90, 90d supply, fill #2

## 2024-02-11 MED ORDER — PNEUMOCOCCAL 20-VAL CONJ VACC 0.5 ML IM SUSY
0.5000 mL | PREFILLED_SYRINGE | Freq: Once | INTRAMUSCULAR | 0 refills | Status: DC | PRN
  Filled 2024-02-11: qty 0.5, fill #0

## 2024-02-11 NOTE — Progress Notes (Signed)
 [facility] [address] [place], [place] [address]  Internal MEDICINE  Office Visit Note  Patient Name: Lauren Hensley  [id number]  [id number]  Date of Service: 02/11/2024  Chief Complaint  Patient presents with   Hypertension   Annual Exam    HPI Lauren Hensley presents for an annual well visit and physical exam.  Well-appearing 58 y.o. female with atrial flutter, hypertension, bilateral lower extremity edema, high cholesterol, lymphedema, and iron  deficiency anemia.  Routine CRC screening: due in 2032 Routine mammogram: due now  DEXA scan: not due yet  Pap smear: due in 2027 Labs: due for labs -- cholesterol panel, B12 and vitamin D New or worsening pain: none  Seen by cardiology recently and also sees hematology Swelling is down in legs Got approved for SSI      Current Medication: Outpatient Encounter Medications as of 02/11/2024  Medication Sig   apixaban  (ELIQUIS ) 5 MG TABS tablet Take 1 tablet (5 mg total) by mouth 2 (two) times Hensley.   Fe Bisgly-Vit C-Vit B12-FA 28-60-0.008-0.4 MG CAPS Take 1 tablet by mouth Hensley.   metoprolol  succinate (TOPROL -XL) 25 MG 24 hr tablet Take 1 tablet (25 mg total) by mouth Hensley.   pneumococcal 20-valent conjugate vaccine (PREVNAR 20) 0.5 ML injection Inject 0.5 mLs into the muscle once as needed for up to 1 dose for immunization.   [DISCONTINUED] potassium chloride  SA (KLOR-CON  M) 20 MEQ tablet Take 1 tablet (20 mEq total) by mouth Hensley.   [DISCONTINUED] triamterene -hydrochlorothiazide  (MAXZIDE -25) 37.5-25 MG tablet Take 1 tablet by mouth Hensley.   potassium chloride  SA (KLOR-CON  M) 20 MEQ tablet Take 1 tablet (20 mEq total) by mouth Hensley.   triamterene -hydrochlorothiazide  (MAXZIDE -25) 37.5-25 MG tablet Take 1 tablet by mouth Hensley.   No facility-administered encounter medications on file as of 02/11/2024.    Surgical History: Past Surgical History:  Procedure Laterality Date   CARDIOVERSION N/A 06/28/2023   Procedure:  CARDIOVERSION;  Surgeon: Lauren Delton, MD;  Location: [facility] ORS;  Service: Cardiovascular;  Laterality: N/A;   COLONOSCOPY WITH PROPOFOL  N/A 02/08/2021   Procedure: COLONOSCOPY WITH PROPOFOL ;  Surgeon: Lauren Daily, MD;  Location: [facility] ENDOSCOPY;  Service: Gastroenterology;  Laterality: N/A;   EMBOLIZATION (CATH LAB) N/A 07/29/2021   Procedure: EMBOLIZATION;  Surgeon: Lauren Mass, MD;  Location: [facility] INVASIVE CV LAB;  Service: Cardiovascular;  Laterality: N/A;   EMBOLIZATION (CATH LAB) N/A 09/02/2021   Procedure: EMBOLIZATION;  Surgeon: Lauren Mass, MD;  Location: [facility] INVASIVE CV LAB;  Service: Cardiovascular;  Laterality: N/A;   Emobolization  09/02/2021   ESOPHAGOGASTRODUODENOSCOPY N/A 02/08/2021   Procedure: ESOPHAGOGASTRODUODENOSCOPY (EGD);  Surgeon: Lauren Daily, MD;  Location: [facility] ENDOSCOPY;  Service: Gastroenterology;  Laterality: N/A;   GIVENS CAPSULE STUDY N/A 02/09/2021   Procedure: GIVENS CAPSULE STUDY;  Surgeon: Lauren Darling, MD;  Location: [facility] ENDOSCOPY;  Service: Endoscopy;  Laterality: N/A;    Medical History: Past Medical History:  Diagnosis Date   Anemia    CHF (congestive heart failure) (HCC)    Headache 06/17/2022   Hypertension     Family History: Family History  Problem Relation Age of Onset   Heart disease Mother    Diabetes Mother    COPD Mother    Heart Problems Mother    Emphysema Father    Hypercholesterolemia Brother    Diabetes Maternal Aunt    Obesity Maternal Aunt    Arthritis Maternal Aunt    Breast cancer Neg Hx     Social History  Socioeconomic History   Marital status: Single    Spouse name: Not on file   Number of children: 1   Years of education: Not on file   Highest education level: Not on file  Occupational History   Not on file  Tobacco Use   Smoking status: Never   Smokeless tobacco: Never  Vaping Use   Vaping status: Never Used  Substance and Sexual Activity   Alcohol use: Not  Currently    Comment: 6-7 months ago   Drug use: Not Currently    Types: Marijuana, Cocaine   Sexual activity: Not Currently  Other Topics Concern   Not on file  Social History Narrative   Lives with brother currently and niece    Social Drivers of Health   Financial Resource Strain: High Risk (07/16/2022)   Overall Financial Resource Strain (CARDIA)    Difficulty of Paying Living Expenses: Very hard  Food Insecurity: No Food Insecurity (07/16/2022)   Hunger Vital Sign    Worried About Running Out of Food in the Last Year: Never true    Ran Out of Food in the Last Year: Never true  Transportation Needs: Unmet Transportation Needs (02/03/2023)   PRAPARE - Administrator, Civil Service (Medical): Yes    Lack of Transportation (Non-Medical): Yes  Physical Activity: Not on file  Stress: Not on file  Social Connections: Not on file  Intimate Partner Violence: Not At Risk (06/17/2022)   Humiliation, Afraid, Rape, and Kick questionnaire    Fear of Current or Ex-Partner: No    Emotionally Abused: No    Physically Abused: No    Sexually Abused: No      Review of Systems  Constitutional:  Positive for fatigue. Negative for activity change, appetite change, chills, fever and unexpected weight change.  HENT: Negative.  Negative for congestion, ear pain, postnasal drip, rhinorrhea, sneezing, sore throat and trouble swallowing.   Eyes: Negative.  Negative for redness.  Respiratory:  Positive for shortness of breath (off and on). Negative for cough, chest tightness and wheezing.   Cardiovascular:  Positive for leg swelling (significantly improved.). Negative for chest pain and palpitations.  Gastrointestinal: Negative.  Negative for abdominal pain, blood in stool, constipation, diarrhea, nausea and vomiting.  Endocrine: Negative.   Genitourinary: Negative.  Negative for difficulty urinating, dysuria, frequency, hematuria and urgency.  Musculoskeletal: Negative.  Negative for  arthralgias, back pain, joint swelling, myalgias and neck pain.  Skin: Negative.  Negative for rash and wound.  Allergic/Immunologic: Negative.  Negative for immunocompromised state.  Neurological:  Positive for numbness (and tingling in feet). Negative for dizziness, tremors, seizures and headaches.  Hematological: Negative.  Negative for adenopathy. Does not bruise/bleed easily.  Psychiatric/Behavioral: Negative.  Negative for behavioral problems (Depression), self-injury, sleep disturbance and suicidal ideas. The patient is not nervous/anxious.     Vital Signs: BP 126/80   Pulse 86   Temp 98.2 F (36.8 C)   Resp 16   Ht 5\' 8"  (1.727 m)   Wt (!) 330 lb 3.2 oz (149.8 kg)   SpO2 97%   BMI 50.21 kg/m    Physical Exam Vitals reviewed.  Constitutional:      General: She is awake. She is not in acute distress.    Appearance: Normal appearance. She is well-developed and well-groomed. She is obese. She is not ill-appearing or diaphoretic.  HENT:     Head: Normocephalic and atraumatic.     Right Ear: Tympanic membrane, ear canal and external  ear normal.     Left Ear: Tympanic membrane, ear canal and external ear normal.     Nose: Nose normal. No congestion or rhinorrhea.     Mouth/Throat:     Lips: Pink.     Mouth: Mucous membranes are moist.     Pharynx: Oropharynx is clear. Uvula midline. No oropharyngeal exudate or posterior oropharyngeal erythema.  Eyes:     General: Lids are normal. Vision grossly intact. Gaze aligned appropriately.     Extraocular Movements: Extraocular movements intact.     Conjunctiva/sclera: Conjunctivae normal.     Pupils: Pupils are equal, round, and reactive to light.  Neck:     Thyroid: No thyromegaly.     Vascular: No JVD.     Trachea: Trachea and phonation normal. No tracheal deviation.  Cardiovascular:     Rate and Rhythm: Normal rate and regular rhythm.     Pulses: Normal pulses.     Heart sounds: Normal heart sounds, S1 normal and S2 normal.  No murmur heard.    No friction rub. No gallop.  Pulmonary:     Effort: Pulmonary effort is normal. No accessory muscle usage or respiratory distress.     Breath sounds: Normal breath sounds and air entry. No wheezing or rales.  Chest:     Chest wall: No tenderness.     Comments: Declined clinical breast exam, mammogram done this month Abdominal:     General: Bowel sounds are normal.     Palpations: Abdomen is soft. There is no shifting dullness, fluid wave, Hensley or pulsatile Hensley.  Musculoskeletal:        General: Normal range of motion.     Cervical back: Normal range of motion and neck supple.     Right lower leg: 1+ Edema present.     Left lower leg: 1+ Edema present.  Lymphadenopathy:     Cervical: No cervical adenopathy.  Skin:    General: Skin is warm and dry.     Capillary Refill: Capillary refill takes less than 2 seconds.  Neurological:     Mental Status: She is alert and oriented to person, place, and time.     Cranial Nerves: No cranial nerve deficit.     Coordination: Coordination normal.     Gait: Gait normal.  Psychiatric:        Mood and Affect: Mood and affect normal.        Behavior: Behavior normal. Behavior is cooperative.        Thought Content: Thought content normal.        Judgment: Judgment normal.        Assessment/Plan: 1. Encounter for routine adult health examination with abnormal findings (Primary) Age-appropriate preventive screenings and vaccinations discussed, annual physical exam completed. Routine labs for health maintenance ordered, see below. PHM updated.   - Vitamin D (25 hydroxy) - B12 and Folate Panel - Lipid Profile  2. Essential hypertension Stable, continue medications as prescribed.  - potassium chloride  SA (KLOR-CON  M) 20 MEQ tablet; Take 1 tablet (20 mEq total) by mouth Hensley.  Dispense: 90 tablet; Refill: 2 - triamterene -hydrochlorothiazide  (MAXZIDE -25) 37.5-25 MG tablet; Take 1 tablet by mouth Hensley.  Dispense: 90 tablet;  Refill: 2  3. Mixed hyperlipidemia Routine lab ordered  - Lipid Profile  4. Bilateral lower extremity edema Continue triamterene -hydrochlorothiazide  as prescribed.  - triamterene -hydrochlorothiazide  (MAXZIDE -25) 37.5-25 MG tablet; Take 1 tablet by mouth Hensley.  Dispense: 90 tablet; Refill: 2  5. B12 deficiency Routine lab ordered - B12  and Folate Panel  6. Vitamin D deficiency Routine lab ordered  - Vitamin D (25 hydroxy)  7. Encounter for screening mammogram for malignant neoplasm of breast Routine mammogram ordered  - MM 3D SCREENING MAMMOGRAM BILATERAL BREAST; Future  8. Need for vaccination - pneumococcal 20-valent conjugate vaccine (PREVNAR 20) 0.5 ML injection; Inject 0.5 mLs into the muscle once as needed for up to 1 dose for immunization.  Dispense: 0.5 mL; Refill: 0     General Counseling: Blayne verbalizes understanding of the findings of todays visit and agrees with plan of treatment. I have discussed any further diagnostic evaluation that may be needed or ordered today. We also reviewed her medications today. she has been encouraged to call the office with any questions or concerns that should arise related to todays visit.    Orders Placed This Encounter  Procedures   MM 3D SCREENING MAMMOGRAM BILATERAL BREAST   Vitamin D (25 hydroxy)   B12 and Folate Panel   Lipid Profile    Meds ordered this encounter  Medications   pneumococcal 20-valent conjugate vaccine (PREVNAR 20) 0.5 ML injection    Sig: Inject 0.5 mLs into the muscle once as needed for up to 1 dose for immunization.    Dispense:  0.5 mL    Refill:  0    Due for prevnar 20   potassium chloride  SA (KLOR-CON  M) 20 MEQ tablet    Sig: Take 1 tablet (20 mEq total) by mouth Hensley.    Dispense:  90 tablet    Refill:  2   triamterene -hydrochlorothiazide  (MAXZIDE -25) 37.5-25 MG tablet    Sig: Take 1 tablet by mouth Hensley.    Dispense:  90 tablet    Refill:  2    Return in about 6 months (around  08/13/2024) for F/U, Mackenize Delgadillo PCP.   Total time spent:30 Minutes Time spent includes review of chart, medications, test results, and follow up plan with the patient.    Controlled Substance Database was reviewed by me.  This patient was seen by Laurence Pons, FNP-C in collaboration with Lauren Hensley. Verneta Gone as a part of collaborative care agreement.  Evette Diclemente R. Bobbi Burow, MSN, FNP-C Internal medicine

## 2024-02-24 ENCOUNTER — Other Ambulatory Visit: Payer: Self-pay

## 2024-03-10 ENCOUNTER — Other Ambulatory Visit: Payer: Self-pay

## 2024-03-24 ENCOUNTER — Other Ambulatory Visit: Payer: Self-pay

## 2024-03-24 ENCOUNTER — Encounter: Payer: Self-pay | Admitting: Internal Medicine

## 2024-03-27 ENCOUNTER — Other Ambulatory Visit: Payer: Self-pay

## 2024-05-23 ENCOUNTER — Other Ambulatory Visit: Payer: Self-pay

## 2024-06-30 ENCOUNTER — Encounter: Payer: Self-pay | Admitting: Internal Medicine

## 2024-06-30 ENCOUNTER — Other Ambulatory Visit: Payer: Self-pay

## 2024-07-03 ENCOUNTER — Other Ambulatory Visit: Payer: Self-pay

## 2024-07-05 ENCOUNTER — Encounter: Payer: Self-pay | Admitting: Internal Medicine

## 2024-07-05 ENCOUNTER — Inpatient Hospital Stay: Admitting: Internal Medicine

## 2024-07-05 ENCOUNTER — Inpatient Hospital Stay

## 2024-07-05 ENCOUNTER — Other Ambulatory Visit: Payer: Self-pay

## 2024-07-05 ENCOUNTER — Inpatient Hospital Stay: Attending: Internal Medicine

## 2024-07-05 ENCOUNTER — Encounter

## 2024-07-05 VITALS — BP 132/84 | HR 90 | Temp 96.2°F | Resp 16 | Wt 330.0 lb

## 2024-07-05 DIAGNOSIS — M2550 Pain in unspecified joint: Secondary | ICD-10-CM | POA: Insufficient documentation

## 2024-07-05 DIAGNOSIS — D649 Anemia, unspecified: Secondary | ICD-10-CM

## 2024-07-05 DIAGNOSIS — Z86018 Personal history of other benign neoplasm: Secondary | ICD-10-CM | POA: Insufficient documentation

## 2024-07-05 DIAGNOSIS — N920 Excessive and frequent menstruation with regular cycle: Secondary | ICD-10-CM | POA: Diagnosis present

## 2024-07-05 DIAGNOSIS — D500 Iron deficiency anemia secondary to blood loss (chronic): Secondary | ICD-10-CM | POA: Diagnosis not present

## 2024-07-05 LAB — CBC WITH DIFFERENTIAL (CANCER CENTER ONLY)
Abs Immature Granulocytes: 0.01 K/uL (ref 0.00–0.07)
Basophils Absolute: 0.1 K/uL (ref 0.0–0.1)
Basophils Relative: 1 %
Eosinophils Absolute: 0.1 K/uL (ref 0.0–0.5)
Eosinophils Relative: 2 %
HCT: 35.2 % — ABNORMAL LOW (ref 36.0–46.0)
Hemoglobin: 10.9 g/dL — ABNORMAL LOW (ref 12.0–15.0)
Immature Granulocytes: 0 %
Lymphocytes Relative: 37 %
Lymphs Abs: 1.7 K/uL (ref 0.7–4.0)
MCH: 22.3 pg — ABNORMAL LOW (ref 26.0–34.0)
MCHC: 31 g/dL (ref 30.0–36.0)
MCV: 72 fL — ABNORMAL LOW (ref 80.0–100.0)
Monocytes Absolute: 0.5 K/uL (ref 0.1–1.0)
Monocytes Relative: 11 %
Neutro Abs: 2.3 K/uL (ref 1.7–7.7)
Neutrophils Relative %: 49 %
Platelet Count: 169 K/uL (ref 150–400)
RBC: 4.89 MIL/uL (ref 3.87–5.11)
RDW: 14.4 % (ref 11.5–15.5)
WBC Count: 4.7 K/uL (ref 4.0–10.5)
nRBC: 0 % (ref 0.0–0.2)

## 2024-07-05 LAB — IRON AND TIBC
Iron: 74 ug/dL (ref 28–170)
Saturation Ratios: 22 % (ref 10.4–31.8)
TIBC: 340 ug/dL (ref 250–450)
UIBC: 266 ug/dL

## 2024-07-05 LAB — BASIC METABOLIC PANEL WITH GFR
Anion gap: 7 (ref 5–15)
BUN: 20 mg/dL (ref 6–20)
CO2: 26 mmol/L (ref 22–32)
Calcium: 9.3 mg/dL (ref 8.9–10.3)
Chloride: 102 mmol/L (ref 98–111)
Creatinine, Ser: 1.2 mg/dL — ABNORMAL HIGH (ref 0.44–1.00)
GFR, Estimated: 53 mL/min — ABNORMAL LOW (ref >60)
Glucose, Bld: 89 mg/dL (ref 70–99)
Potassium: 4.4 mmol/L (ref 3.5–5.1)
Sodium: 135 mmol/L (ref 135–145)

## 2024-07-05 LAB — FERRITIN: Ferritin: 24 ng/mL (ref 11–307)

## 2024-07-05 MED ORDER — FE BISGLY-VIT C-VIT B12-FA 28-60-0.008-0.4 MG PO CAPS
2.0000 | ORAL_CAPSULE | Freq: Every day | ORAL | 1 refills | Status: AC
  Filled 2024-07-05: qty 180, 90d supply, fill #0
  Filled 2024-09-27: qty 180, 90d supply, fill #1

## 2024-07-05 NOTE — Assessment & Plan Note (Addendum)
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 S/p IV Venofer .    # Today hemoglobin is 10.9 - slight lower-given poor IV access okay to HOLD venofer  today.  Will refill oral pills recommend 2 pills a day.    # Menorrhagia-fibroids s/p gynecology evaluation. S/p partial embolization left & right uterine artery  [Dr.Dew]-DC 2022]- stable.    # IV access: POOR discussed continue hydration.  We will have to consider either port placement if patient needs ongoing labs/infusions.  Patient understands need for hydration prior to IV access.  needs transportation # DISPOSITION # HOLD venofer  today  # follow up in 6 months- MD; labs- cbc;bmp; iron  studies;ferritin; possible- venofer - Dr.B

## 2024-07-05 NOTE — Progress Notes (Signed)
 [facility] OFFICE PROGRESS NOTE  Patient Care Team: Liana Fish, NP as PCP - General (Nurse Practitioner) Darliss Rogue, MD as PCP - Cardiology (Cardiology) Cindie Ole DASEN, MD as PCP - Electrophysiology (Cardiology) Cindie Jesusa HERO, RN as Registered Nurse Dannielle Arlean FALCON, RN (Inactive) as Registered Nurse Rennie Cindy SAUNDERS, MD as Consulting Physician (Oncology)   Cancer Staging  No matching staging information was found for the patient.    Oncology History   No history exists.   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   #Heavy menstrual cycles-fibroids [Dr Shuman]; DEC 2022- s/p uterine embolization.    INTERVAL HISTORY: Ambulating independently.  Alone.  Lauren Hensley 58 y.o.  female pleasant patient history of iron  deficient anemia/beta thalassemia-other history of fibroids/heavy menstrual cycle currently status post ablation is here for follow-up.  Patient denies any worsening fatigue.  Denies any shortness of breath or cough.  Noted to have intermittent menstrual cycles.  However not heavy.  Patient  compliant with PO iron .  Review of Systems  Constitutional:  Negative for chills, diaphoresis, fever and weight loss.  HENT:  Negative for nosebleeds and sore throat.   Eyes:  Negative for double vision.  Respiratory:  Negative for cough, hemoptysis, sputum production, shortness of breath and wheezing.   Cardiovascular:  Negative for chest pain, palpitations, orthopnea and leg swelling.  Gastrointestinal:  Negative for abdominal pain, blood in stool, constipation, diarrhea, heartburn, melena, nausea and vomiting.  Genitourinary:  Negative for dysuria, frequency and urgency.  Musculoskeletal:  Positive for joint pain. Negative for back pain.  Skin: Negative.  Negative for itching and rash.  Neurological:  Negative for tingling, focal weakness, weakness and headaches.   Endo/Heme/Allergies:  Does not bruise/bleed easily.  Psychiatric/Behavioral:  Negative for depression. The patient is not nervous/anxious and does not have insomnia.       PAST MEDICAL HISTORY :  Past Medical History:  Diagnosis Date   Anemia    CHF (congestive heart failure) (HCC)    Headache 06/17/2022   Hypertension     PAST SURGICAL HISTORY :   Past Surgical History:  Procedure Laterality Date   CARDIOVERSION N/A 06/28/2023   Procedure: CARDIOVERSION;  Surgeon: Darliss Rogue, MD;  Location: [facility] ORS;  Service: Cardiovascular;  Laterality: N/A;   COLONOSCOPY WITH PROPOFOL  N/A 02/08/2021   Procedure: COLONOSCOPY WITH PROPOFOL ;  Surgeon: Unk Corinn Skiff, MD;  Location: [facility] ENDOSCOPY;  Service: Gastroenterology;  Laterality: N/A;   EMBOLIZATION (CATH LAB) N/A 07/29/2021   Procedure: EMBOLIZATION;  Surgeon: Jama Cordella MATSU, MD;  Location: [facility] INVASIVE CV LAB;  Service: Cardiovascular;  Laterality: N/A;   EMBOLIZATION (CATH LAB) N/A 09/02/2021   Procedure: EMBOLIZATION;  Surgeon: Jama Cordella MATSU, MD;  Location: [facility] INVASIVE CV LAB;  Service: Cardiovascular;  Laterality: N/A;   Emobolization  09/02/2021   ESOPHAGOGASTRODUODENOSCOPY N/A 02/08/2021   Procedure: ESOPHAGOGASTRODUODENOSCOPY (EGD);  Surgeon: Unk Corinn Skiff, MD;  Location: [facility] ENDOSCOPY;  Service: Gastroenterology;  Laterality: N/A;   GIVENS CAPSULE STUDY N/A 02/09/2021   Procedure: GIVENS CAPSULE STUDY;  Surgeon: Maryruth Ole DASEN, MD;  Location: [facility] ENDOSCOPY;  Service: Endoscopy;  Laterality: N/A;    FAMILY HISTORY :   Family History  Problem Relation Age of Onset   Heart disease Mother    Diabetes Mother    COPD Mother    Heart Problems Mother    Emphysema Father    Hypercholesterolemia Brother    Diabetes Maternal Aunt  Obesity Maternal Aunt    Arthritis Maternal Aunt    Breast cancer Neg Hx     SOCIAL HISTORY:   Social History   Tobacco Use   Smoking status: Never    Smokeless tobacco: Never  Vaping Use   Vaping status: Never Used  Substance Use Topics   Alcohol use: Not Currently    Comment: 6-7 months ago   Drug use: Not Currently    Types: Marijuana, Cocaine    ALLERGIES:  has no known allergies.  MEDICATIONS:  Current Outpatient Medications  Medication Sig Dispense Refill   apixaban  (ELIQUIS ) 5 MG TABS tablet Take 1 tablet (5 mg total) by mouth 2 (two) times daily. 180 tablet 3   metoprolol  succinate (TOPROL -XL) 25 MG 24 hr tablet Take 1 tablet (25 mg total) by mouth daily. 90 tablet 3   pneumococcal 20-valent conjugate vaccine (PREVNAR 20 ) 0.5 ML injection Inject 0.5 mLs into the muscle once as needed for up to 1 dose for immunization. 0.5 mL 0   potassium chloride  SA (KLOR-CON  M) 20 MEQ tablet Take 1 tablet (20 mEq total) by mouth daily. 90 tablet 2   triamterene -hydrochlorothiazide  (MAXZIDE -25) 37.5-25 MG tablet Take 1 tablet by mouth daily. 90 tablet 2   Fe Bisgly-Vit C-Vit B12-FA 28-60-0.008-0.4 MG CAPS Take 2 tablets by mouth daily. 180 capsule 1   No current facility-administered medications for this visit.    PHYSICAL EXAMINATION:  BP 132/84 (BP Location: Left Wrist, Patient Position: Sitting, Cuff Size: Normal)   Pulse 90   Temp (!) 96.2 F (35.7 C) (Tympanic)   Resp 16   Wt (!) 330 lb (149.7 kg)   SpO2 100%   BMI 50.18 kg/m   Filed Weights   07/05/24 1335  Weight: (!) 330 lb (149.7 kg)     Physical Exam Vitals and nursing note reviewed.  HENT:     Head: Normocephalic and atraumatic.     Mouth/Throat:     Pharynx: Oropharynx is clear.  Eyes:     Extraocular Movements: Extraocular movements intact.     Pupils: Pupils are equal, round, and reactive to light.  Cardiovascular:     Rate and Rhythm: Normal rate and regular rhythm.  Pulmonary:     Comments: Decreased breath sounds bilaterally.  Abdominal:     Palpations: Abdomen is soft.  Musculoskeletal:        General: Normal range of motion.     Cervical back:  Normal range of motion.  Skin:    General: Skin is warm.  Neurological:     General: No focal deficit present.     Mental Status: She is alert and oriented to person, place, and time.  Psychiatric:        Behavior: Behavior normal.        Judgment: Judgment normal.        LABORATORY DATA:  I have reviewed the data as listed    Component Value Date/Time   NA 135 07/05/2024 1320   NA 142 06/24/2023 1230   K 4.4 07/05/2024 1320   CL 102 07/05/2024 1320   CO2 26 07/05/2024 1320   GLUCOSE 89 07/05/2024 1320   BUN 20 07/05/2024 1320   BUN 15 06/24/2023 1230   CREATININE 1.20 (H) 07/05/2024 1320   CREATININE 0.99 08/05/2023 1337   CALCIUM 9.3 07/05/2024 1320   PROT 7.7 01/03/2024 1513   ALBUMIN 3.8 01/03/2024 1513   AST 17 01/03/2024 1513   ALT 10 01/03/2024 1513   ALKPHOS 37 (  L) 01/03/2024 1513   BILITOT 0.7 01/03/2024 1513   GFRNONAA 53 (L) 07/05/2024 1320   GFRNONAA >60 08/05/2023 1337    No results found for: SPEP, UPEP  Lab Results  Component Value Date   WBC 4.7 07/05/2024   NEUTROABS 2.3 07/05/2024   HGB 10.9 (L) 07/05/2024   HCT 35.2 (L) 07/05/2024   MCV 72.0 (L) 07/05/2024   PLT 169 07/05/2024      Chemistry      Component Value Date/Time   NA 135 07/05/2024 1320   NA 142 06/24/2023 1230   K 4.4 07/05/2024 1320   CL 102 07/05/2024 1320   CO2 26 07/05/2024 1320   BUN 20 07/05/2024 1320   BUN 15 06/24/2023 1230   CREATININE 1.20 (H) 07/05/2024 1320   CREATININE 0.99 08/05/2023 1337      Component Value Date/Time   CALCIUM 9.3 07/05/2024 1320   ALKPHOS 37 (L) 01/03/2024 1513   AST 17 01/03/2024 1513   ALT 10 01/03/2024 1513   BILITOT 0.7 01/03/2024 1513       RADIOGRAPHIC STUDIES: I have personally reviewed the radiological images as listed and agreed with the findings in the report. No results found.   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 S/p IV Venofer .    # Today hemoglobin is 10.9 - slight lower-given poor IV access okay to HOLD venofer  today.  Will refill oral pills recommend 2 pills a day.    # Menorrhagia-fibroids s/p gynecology evaluation. S/p partial embolization left & right uterine artery  [Dr.Dew]-DC 2022]- stable.    # IV access: POOR discussed continue hydration.  We will have to consider either port placement if patient needs ongoing labs/infusions.  Patient understands need for hydration prior to IV access.  needs transportation # DISPOSITION # HOLD venofer  today  # follow up in 6 months- MD; labs- cbc;bmp; iron  studies;ferritin; possible- venofer - Dr.B    Orders Placed This Encounter  Procedures   CBC with Differential (Cancer Center Only)    Standing Status:   Future    Expected Date:   01/03/2025    Expiration Date:   04/03/2025   Basic Metabolic Panel - Cancer Center Only    Standing Status:   Future    Expected Date:   01/03/2025    Expiration Date:   04/03/2025   Iron  and TIBC    Standing Status:   Future    Expected Date:   01/03/2025    Expiration Date:   04/03/2025   Ferritin    Standing Status:   Future    Expected Date:   01/03/2025    Expiration Date:   04/03/2025   All questions were answered. The patient knows to call the clinic with any problems, questions or concerns.      Cindy JONELLE Joe, MD 07/05/2024 2:29 PM

## 2024-07-06 ENCOUNTER — Other Ambulatory Visit: Payer: Self-pay

## 2024-07-14 ENCOUNTER — Ambulatory Visit: Admitting: Cardiology

## 2024-07-14 NOTE — Progress Notes (Deleted)
 Electrophysiology Clinic Note    Date:  07/14/2024  Patient ID:  Lauren, Hensley Feb 04, 1966, MRN [id number] PCP:  Liana Fish, NP  Cardiologist:  Redell Cave, MD  Electrophysiologist:  OLE ONEIDA HOLTS, MD    ***refresh  Discussed the use of AI scribe software for clinical note transcription with the patient, who gave verbal consent to proceed.   Patient Profile    Chief Complaint: Aflutter follow-up  History of Present Illness: Lauren Hensley is a 58 y.o. female with PMH notable for parox AFib, aflutter, HTN ; seen today for OLE ONEIDA HOLTS, MD for routine electrophysiology followup.   She is s/p DCCV 05/2023 where she converted to NSR initially, but had IRAF.  She last saw Dr. HOLTS 06/2023 for EP evaluation of Aflutter, where she had converted to sinus rhythm. Not invasive EP procedural candidate d/t BMI.   I saw her in follow-up 07/2023 after zio monitor did not reveal Aflutter episodes, so recommended to cont to monitor arrhythmia.   On follow-up today, *** AF burden, symptoms *** palpitations *** bleeding concerns   Since last being seen in our clinic the patient reports doing ***.  she denies chest pain, palpitations, dyspnea, PND, orthopnea, nausea, vomiting, dizziness, syncope, edema, weight gain, or early satiety.      Arrhythmia/Device History No specialty comments available.    ROS:  Please see the history of present illness. All other systems are reviewed and otherwise negative.    Physical Exam    VS:  There were no vitals taken for this visit. BMI: There is no height or weight on file to calculate BMI.           Wt Readings from Last 3 Encounters:  07/05/24 (!) 330 lb (149.7 kg)  02/11/24 (!) 330 lb 3.2 oz (149.8 kg)  02/04/24 (!) 328 lb (148.8 kg)     GEN- The patient is well appearing, alert and oriented x 3 today.   Lungs- Clear to ausculation bilaterally, normal work of breathing.  Heart- {Blank  single:19197::Regular,Irregularly irregular} rate and rhythm, no murmurs, rubs or gallops Extremities- {EDEMA LEVEL:28147::No} peripheral edema, warm, dry   Studies Reviewed   Previous EP, cardiology notes.    EKG is ordered. Personal review of EKG from today shows:  ***        Long term monitor, 08/10/2023 HR 37 - 139 bpm, average 53 bpm. 122 SVT episodes, longest 52.1 seconds. Rhythm strip suggests atrial tachycardia. Rare supraventricular and ventricular ectopy. No atrial fibrillation.   TTE, 07/16/2022  1. Left ventricular ejection fraction, by estimation, is 55 to 60%. The left ventricle has normal function. The left ventricle has no regional wall motion abnormalities. There is mild left ventricular hypertrophy. Left ventricular diastolic parameters were normal.   2. Right ventricular systolic function is normal. The right ventricular size is normal. There is normal pulmonary artery systolic pressure. The estimated right ventricular systolic pressure is 27.3 mmHg.   3. The mitral valve is normal in structure. No evidence of mitral valve regurgitation. No evidence of mitral stenosis.   4. Tricuspid valve regurgitation is moderate.   5. The aortic valve is tricuspid. Aortic valve regurgitation is not visualized. No aortic stenosis is present.   6. The inferior vena cava is normal in size with greater than 50% respiratory variability, suggesting right atrial pressure of 3 mmHg.    Assessment and Plan     #) Aflutter   #) Hypercoag d/t aflutter  CHA2DS2-VASc Score = at  least 2 [CHF History: 1, HTN History: 0, Diabetes History: 0, Stroke History: 0, Vascular Disease History: 0, Age Score: 0, Gender Score: 1].  Therefore, the patient's annual risk of stroke is 2.2 %.     {Confirm score is correct.  If not, click here to update score.  REFRESH note.  :1}   Stroke ppx - 5mg  eliquis  BID, appropriately dosed No bleeding concerns    #) ***   {Are you ordering a CV  Procedure (e.g. stress test, cath, DCCV, TEE, etc)?   Press F2        :[id number]}   Current medicines are reviewed at length with the patient today.   The patient {ACTIONS; HAS/DOES NOT HAVE:19233} concerns regarding her medicines.  The following changes were made today:  {NONE DEFAULTED:18576}  Labs/ tests ordered today include: *** No orders of the defined types were placed in this encounter.    Disposition: Follow up with {EPMDS:28135::EP Team} or EP APP {EPFOLLOW UP:28173}   Signed, Chantal Needle, NP  07/14/24  8:42 AM  Electrophysiology [facility]

## 2024-07-24 NOTE — Progress Notes (Unsigned)
 Electrophysiology Clinic Note    Date:  07/25/2024  Patient ID:  Melah, Ebling 1966-01-20, MRN [id number] PCP:  Liana Fish, NP  Cardiologist:  Redell Cave, MD  Electrophysiologist:  OLE ONEIDA HOLTS, MD  Electrophysiology APP:  Amedee Cerrone, NP    Discussed the use of AI scribe software for clinical note transcription with the patient, who gave verbal consent to proceed.   Patient Profile    Chief Complaint: Aflutter follow-up  History of Present Illness: Janilah Hojnacki is a 58 y.o. female with PMH notable for parox AFib, aflutter, HTN, IDA; seen today for OLE ONEIDA HOLTS, MD for routine electrophysiology followup.   She is s/p DCCV 05/2023 where she converted to NSR initially, but had IRAF.  She last saw Dr. Holts 06/2023 for EP evaluation of Aflutter, where she had converted to sinus rhythm. Not invasive EP procedural candidate d/t BMI. She was having fleeting chest pain and SOB, unsure if corresponded to arrhythmia. Zio ordered at that visit to further eval.   I saw her in follow-up 07/2023 after zio monitor did not reveal Aflutter episodes, so recommended to cont to monitor arrhythmia.   On follow-up today, she continues to have fleeting episodes of chest fluttering or zinging in chest that is very short in duration. She has these episodes sporadically and sometimes doesn't have any for several days, and other times has several episodes in a day. She has not had any of these sensations during visit today.   She continues to take eliquis  BID, no bleeding concerns, does occasionally miss a dose.  She also takes metoprolol  at a dose of 25 mg. No dizziness, lightheadedness, or wooziness is reported after positional changes. She exercises regularly but sometimes experiences shortness of breath after about ten minutes of activity, requiring rest before continuing.  She does not check BP regularly, does not have BP cuff.    Arrhythmia/Device History No  specialty comments available.    ROS:  Please see the history of present illness. All other systems are reviewed and otherwise negative.    Physical Exam    VS:  BP 118/80 (BP Location: Left Arm, Patient Position: Sitting, Cuff Size: Normal)   Pulse 68   Wt (!) 332 lb (150.6 kg)   SpO2 99%   BMI 50.48 kg/m  BMI: Body mass index is 50.48 kg/m.           Wt Readings from Last 3 Encounters:  07/25/24 (!) 332 lb (150.6 kg)  07/05/24 (!) 330 lb (149.7 kg)  02/11/24 (!) 330 lb 3.2 oz (149.8 kg)     GEN- The patient is well appearing, alert and oriented x 3 today.   Lungs- Diminished throughout,  normal work of breathing.  Heart- Irregularly irregular rate and rhythm, no murmurs, rubs or gallops Extremities- Trace peripheral edema, warm, dry   Studies Reviewed   Previous EP, cardiology notes.    EKG is ordered. Personal review of EKG from today shows:    EKG Interpretation Date/Time:  Tuesday July 25 2024 13:42:56 EDT Ventricular Rate:  68 PR Interval:    QRS Duration:  84 QT Interval:  408 QTC Calculation: 433 R Axis:   -9  Text Interpretation: sinus rhythm with sinus arrhythmia Confirmed by Shanavia Makela [id number]) on 07/25/2024 5:37:31 PM    Long term monitor, 08/10/2023 HR 37 - 139 bpm, average 53 bpm. 122 SVT episodes, longest 52.1 seconds. Rhythm strip suggests atrial tachycardia. Rare supraventricular and ventricular ectopy. No atrial fibrillation.  TTE, 07/16/2022  1. Left ventricular ejection fraction, by estimation, is 55 to 60%. The left ventricle has normal function. The left ventricle has no regional wall motion abnormalities. There is mild left ventricular hypertrophy. Left ventricular diastolic parameters were normal.   2. Right ventricular systolic function is normal. The right ventricular size is normal. There is normal pulmonary artery systolic pressure. The estimated right ventricular systolic pressure is 27.3 mmHg.   3. The mitral valve is  normal in structure. No evidence of mitral valve regurgitation. No evidence of mitral stenosis.   4. Tricuspid valve regurgitation is moderate.   5. The aortic valve is tricuspid. Aortic valve regurgitation is not visualized. No aortic stenosis is present.   6. The inferior vena cava is normal in size with greater than 50% respiratory variability, suggesting right atrial pressure of 3 mmHg.    Assessment and Plan     #) Aflutter #) atrial tach EKG today appears sinus with sinus arrhythmia vs atrial tach She is asymptomatic of this, no zinging sensation today during visit She is tolerating 25mg  toprol  well without off-target effects Will increase toprol  to 50mg  daily. We discussed that if she experiences LH, dizziness to notify office Will rx BP cuff - have encouraged her to check BP to ensure increased toprol  does not cause hypotension Will update 2 week zio, encouraged her to press symptom button when has zinging episode to attempt to correlate with arrhythmia  #) Hypercoag d/t aflutter  CHA2DS2-VASc Score = at least 2 [CHF History: 0, HTN History: 1, Diabetes History: 0, Stroke History: 0, Vascular Disease History: 0, Age Score: 0, Gender Score: 1].  Therefore, the patient's annual risk of stroke is 2.2 %.    Stroke ppx - 5mg  eliquis  BID, appropriately dosed No bleeding concerns  #) HTN Well-controlled in office today Encouraged her to monitor BP 2-3 times per week and record readings. Notify office if BP readings are consistently < systolic  #) IDA Managed by benign hem On oral Iron  supplementation at this time       Current medicines are reviewed at length with the patient today.   The patient does not have concerns regarding her medicines.  The following changes were made today:   INCREASE toprol  to 50mg  daily  Labs/ tests ordered today include:  Orders Placed This Encounter  Procedures   LONG TERM MONITOR (3-14 DAYS)   EKG 12-Lead     Disposition:  Follow up with Dr. Kennyth or EP APP in 3 months   Signed, Inanna Telford, NP  07/25/24  5:47 PM  Electrophysiology [facility]

## 2024-07-25 ENCOUNTER — Ambulatory Visit: Attending: Cardiology | Admitting: Cardiology

## 2024-07-25 ENCOUNTER — Ambulatory Visit

## 2024-07-25 ENCOUNTER — Encounter: Payer: Self-pay | Admitting: Cardiology

## 2024-07-25 ENCOUNTER — Encounter: Payer: Self-pay | Admitting: Internal Medicine

## 2024-07-25 ENCOUNTER — Other Ambulatory Visit: Payer: Self-pay

## 2024-07-25 VITALS — BP 118/80 | HR 68 | Wt 332.0 lb

## 2024-07-25 DIAGNOSIS — I4892 Unspecified atrial flutter: Secondary | ICD-10-CM | POA: Insufficient documentation

## 2024-07-25 DIAGNOSIS — D6869 Other thrombophilia: Secondary | ICD-10-CM | POA: Insufficient documentation

## 2024-07-25 DIAGNOSIS — I10 Essential (primary) hypertension: Secondary | ICD-10-CM | POA: Insufficient documentation

## 2024-07-25 MED ORDER — BLOOD PRESSURE CUFF MISC
0 refills | Status: DC
Start: 2024-07-25 — End: 2024-08-15

## 2024-07-25 MED ORDER — OMRON 3 SERIES BP MONITOR DEVI
0 refills | Status: AC
Start: 2024-07-25 — End: ?
  Filled 2024-07-25: qty 1, 30d supply, fill #0

## 2024-07-25 MED ORDER — METOPROLOL SUCCINATE ER 50 MG PO TB24: 50.0000 mg | ORAL_TABLET | Freq: Every day | ORAL | Status: DC

## 2024-07-25 NOTE — Patient Instructions (Signed)
 Medication Instructions:  Your physician recommends the following medication changes.  INCREASE:  Metoprolol  from 25 mg to 50 mg  Prescription for Blood Pressure Cuff - please monitor your blood pressure  & heart rate .  Keep a log (attached).  Recommend taking one to two hour(s) after taking blood pressure medications.   Make sure you take your Eliquis  regularly and all other prescriptions as prescribed.  *If you need a refill on your cardiac medications before your next appointment, please call your pharmacy*  Lab Work:  No labs ordered today   If you have labs (blood work) drawn today and your tests are completely normal, you will receive your results only by: MyChart Message (if you have MyChart) OR A paper copy in the mail If you have any lab test that is abnormal or we need to change your treatment, we will call you to review the results.  Testing/Procedures:  GEOFFRY HEWS- Long Term Monitor Instructions   Your physician has requested you wear a ZIO patch monitor for 14 days.  This is a single patch monitor. [employer] supplies one patch monitor per enrollment. Additional stickers are not available. Please do not apply patch if you will be having a Nuclear Stress Test, Echocardiogram, Cardiac CT, MRI, or Chest Xray during the period you would be wearing the monitor. The patch cannot be worn during these tests. You cannot remove and re-apply the ZIO XT patch monitor.  Your ZIO patch monitor will be mailed 3 day USPS to your address on file. It may take 3-5 days to receive your monitor after you have been enrolled. Once you have received your monitor, please review the enclosed instructions. Your monitor has already been registered assigning a specific monitor serial number to you.  Billing and Patient Assistance Program Information  We have supplied [employer] with any of your insurance information on file for billing purposes.  [employer] offers a sliding scale Patient Assistance Program for  patients that do not have insurance, or whose insurance does not completely cover the cost of the ZIO monitor.  You must apply for the Patient Assistance Program to qualify for this discounted rate.  To apply, please call [employer] at [phone], select option 4, select option 2, ask to apply for Patient Assistance Program. Meredeth will ask your household income, and how many people are in your household. They will quote your out-of-pocket cost based on that information. [employer] will also be able to set up a 58-month, interest-free payment plan if needed.  Applying the monitor   Hold abrader disc by orange tab. Rub abrader in 40 strokes over the upper left chest as indicated in your monitor instructions.  Clean area with 4 enclosed alcohol pads. Let dry.  Apply patch as indicated in monitor instructions. Patch will be placed under collarbone on left side of chest with arrow pointing upward.  Rub patch adhesive wings for 2 minutes. Remove white label marked 1. Remove the white label marked 2. Rub patch adhesive wings for 2 additional minutes.  While looking in a mirror, press and release button in center of patch. A small green light will flash 3-4 times. This will be your only indicator that the monitor has been turned on.   After Applying Monitor:  Do not shower for the first 24 hours. You may shower after the first 24 hours.  Press the button if you feel a symptom. You will hear a small click. Record Date, Time and Symptom in the Patient Logbook.  After Completing 14 Days:  When you are ready to remove the patch, follow instructions on the last 2 pages of Patient Logbook.  Stick patch monitor into the tabs at the bottom of the return box.  Place Patient Logbook in the blue and white box. Use locking tab on box and tape box closed securely. The blue and white box has prepaid postage on it. Please place it in the mailbox as soon as possible. Your physician should have your test results  approximately 7-14 days after the monitor has been mailed back to [facility].   Troubleshooting: Call [facility] at [phone] if you have questions regarding your ZIO XT patch monitor.  Call them immediately if you see an orange light blinking on your monitor.  If your monitor falls off in less than 4 days, contact our Monitor department at [phone].  If your monitor becomes loose or falls off after 4 days call [employer] at [phone] for suggestions on securing your monitor.   Follow-Up: At [facility], you and your health needs are our priority.  As part of our continuing mission to provide you with exceptional heart care, our providers are all part of one team.  This team includes your primary Cardiologist (physician) and Advanced Practice Providers or APPs (Physician Assistants and Nurse Practitioners) who all work together to provide you with the care you need, when you need it.  Your next appointment:   3 month(s)  Provider:  Judye Kitty, MD or Suzann Riddle, NP          We recommend signing up for the patient portal called MyChart.  Sign up information is provided on this After Visit Summary.  MyChart is used to connect with patients for Virtual Visits (Telemedicine).  Patients are able to view lab/test results, encounter notes, upcoming appointments, etc.  Non-urgent messages can be sent to your provider as well.   To learn more about what you can do with MyChart, go to [email or web address].

## 2024-08-10 ENCOUNTER — Other Ambulatory Visit: Payer: Self-pay

## 2024-08-10 ENCOUNTER — Other Ambulatory Visit: Payer: Self-pay | Admitting: Nurse Practitioner

## 2024-08-10 ENCOUNTER — Other Ambulatory Visit: Payer: Self-pay | Admitting: Cardiology

## 2024-08-10 DIAGNOSIS — I4892 Unspecified atrial flutter: Secondary | ICD-10-CM

## 2024-08-10 DIAGNOSIS — I10 Essential (primary) hypertension: Secondary | ICD-10-CM

## 2024-08-10 MED ORDER — METOPROLOL SUCCINATE ER 25 MG PO TB24
25.0000 mg | ORAL_TABLET | Freq: Every day | ORAL | 3 refills | Status: DC
Start: 2024-08-10 — End: 2024-08-15
  Filled 2024-08-10: qty 90, 90d supply, fill #0

## 2024-08-15 ENCOUNTER — Encounter: Payer: Self-pay | Admitting: Nurse Practitioner

## 2024-08-15 ENCOUNTER — Ambulatory Visit (INDEPENDENT_AMBULATORY_CARE_PROVIDER_SITE_OTHER): Admitting: Nurse Practitioner

## 2024-08-15 ENCOUNTER — Other Ambulatory Visit: Payer: Self-pay

## 2024-08-15 DIAGNOSIS — I4892 Unspecified atrial flutter: Secondary | ICD-10-CM | POA: Diagnosis not present

## 2024-08-15 DIAGNOSIS — R600 Localized edema: Secondary | ICD-10-CM | POA: Diagnosis not present

## 2024-08-15 DIAGNOSIS — I10 Essential (primary) hypertension: Secondary | ICD-10-CM | POA: Diagnosis not present

## 2024-08-15 DIAGNOSIS — Z23 Encounter for immunization: Secondary | ICD-10-CM | POA: Diagnosis not present

## 2024-08-15 MED ORDER — POTASSIUM CHLORIDE CRYS ER 20 MEQ PO TBCR
20.0000 meq | EXTENDED_RELEASE_TABLET | Freq: Every day | ORAL | 2 refills | Status: AC
  Filled 2024-08-15: qty 90, 90d supply, fill #0

## 2024-08-15 MED ORDER — APIXABAN 5 MG PO TABS
5.0000 mg | ORAL_TABLET | Freq: Two times a day (BID) | ORAL | 3 refills | Status: AC
  Filled 2024-08-15 – 2024-10-03 (×2): qty 180, 90d supply, fill #0

## 2024-08-15 MED ORDER — TRIAMTERENE-HCTZ 37.5-25 MG PO TABS
1.0000 | ORAL_TABLET | Freq: Every day | ORAL | 2 refills | Status: AC
  Filled 2024-08-15: qty 90, 90d supply, fill #0

## 2024-08-15 MED ORDER — PNEUMOCOCCAL 20-VAL CONJ VACC 0.5 ML IM SUSY
0.5000 mL | PREFILLED_SYRINGE | Freq: Once | INTRAMUSCULAR | 0 refills | Status: AC | PRN
  Filled 2024-08-15: qty 0.5, fill #0

## 2024-08-15 NOTE — Progress Notes (Signed)
 [facility] [address] [place], [place] [address]  Internal MEDICINE  Office Visit Note  Patient Name: Lauren Hensley  [id number]  [id number]  Date of Service: 08/15/2024  Chief Complaint  Patient presents with   Hypertension   Follow-up    HPI Harmonii presents for a follow-up visit for afib, hypertension, IDA, screenings and vaccines. Wants to see dentist for check up Due for prevnar 20  vaccine AFIB -- on eliquis  and metoprolol  Hypertension -- controlled with current medication  IDA -- on iron  supplement   Current Medication: Outpatient Encounter Medications as of 08/15/2024  Medication Sig   apixaban  (ELIQUIS ) 5 MG TABS tablet Take 1 tablet (5 mg total) by mouth 2 (two) times daily.   Blood Pressure Monitoring (OMRON 3 SERIES BP MONITOR) DEVI Check blood pressure as instructed by your physician   Fe Bisgly-Vit C-Vit B12-FA 28-60-0.008-0.4 MG CAPS Take 2 tablets by mouth daily.   metoprolol  succinate (TOPROL -XL) 50 MG 24 hr tablet Take 1 tablet (50 mg total) by mouth daily.   pneumococcal 20-valent conjugate vaccine (PREVNAR 20 ) 0.5 ML injection Inject 0.5 mLs into the muscle once as needed for up to 1 dose for immunization.   potassium chloride  SA (KLOR-CON  M) 20 MEQ tablet Take 1 tablet (20 mEq total) by mouth daily.   triamterene -hydrochlorothiazide  (MAXZIDE -25) 37.5-25 MG tablet Take 1 tablet by mouth daily.   [DISCONTINUED] apixaban  (ELIQUIS ) 5 MG TABS tablet Take 1 tablet (5 mg total) by mouth 2 (two) times daily.   [DISCONTINUED] Blood Pressure Monitoring (BLOOD PRESSURE CUFF) MISC Check blood pressure as instructed by your physician   [DISCONTINUED] metoprolol  succinate (TOPROL -XL) 25 MG 24 hr tablet Take 1 tablet (25 mg total) by mouth daily.   [DISCONTINUED] pneumococcal 20-valent conjugate vaccine (PREVNAR 20 ) 0.5 ML injection Inject 0.5 mLs into the muscle once as needed for up to 1 dose for immunization.   [DISCONTINUED] potassium chloride  SA (KLOR-CON   M) 20 MEQ tablet Take 1 tablet (20 mEq total) by mouth daily.   [DISCONTINUED] triamterene -hydrochlorothiazide  (MAXZIDE -25) 37.5-25 MG tablet Take 1 tablet by mouth daily.   No facility-administered encounter medications on file as of 08/15/2024.    Surgical History: Past Surgical History:  Procedure Laterality Date   CARDIOVERSION N/A 06/28/2023   Procedure: CARDIOVERSION;  Surgeon: Darliss Rogue, MD;  Location: [facility] ORS;  Service: Cardiovascular;  Laterality: N/A;   COLONOSCOPY WITH PROPOFOL  N/A 02/08/2021   Procedure: COLONOSCOPY WITH PROPOFOL ;  Surgeon: Unk Corinn Skiff, MD;  Location: [facility] ENDOSCOPY;  Service: Gastroenterology;  Laterality: N/A;   EMBOLIZATION (CATH LAB) N/A 07/29/2021   Procedure: EMBOLIZATION;  Surgeon: Jama Cordella MATSU, MD;  Location: [facility] INVASIVE CV LAB;  Service: Cardiovascular;  Laterality: N/A;   EMBOLIZATION (CATH LAB) N/A 09/02/2021   Procedure: EMBOLIZATION;  Surgeon: Jama Cordella MATSU, MD;  Location: [facility] INVASIVE CV LAB;  Service: Cardiovascular;  Laterality: N/A;   Emobolization  09/02/2021   ESOPHAGOGASTRODUODENOSCOPY N/A 02/08/2021   Procedure: ESOPHAGOGASTRODUODENOSCOPY (EGD);  Surgeon: Unk Corinn Skiff, MD;  Location: [facility] ENDOSCOPY;  Service: Gastroenterology;  Laterality: N/A;   GIVENS CAPSULE STUDY N/A 02/09/2021   Procedure: GIVENS CAPSULE STUDY;  Surgeon: Maryruth Ole DASEN, MD;  Location: [facility] ENDOSCOPY;  Service: Endoscopy;  Laterality: N/A;    Medical History: Past Medical History:  Diagnosis Date   Anemia    CHF (congestive heart failure) (HCC)    Clotting disorder    Headache 06/17/2022   Heart murmur    Hypertension     Family History: Family History  Problem Relation Age of Onset   Heart disease Mother    Diabetes Mother    COPD Mother    Heart Problems Mother    Heart failure Mother    Emphysema Father    Hypercholesterolemia Brother    Diabetes Maternal Aunt    Obesity Maternal Aunt    Arthritis  Maternal Aunt    Breast cancer Neg Hx     Social History   Socioeconomic History   Marital status: Single    Spouse name: Not on file   Number of children: 1   Years of education: Not on file   Highest education level: Not on file  Occupational History   Not on file  Tobacco Use   Smoking status: Never   Smokeless tobacco: Never  Vaping Use   Vaping status: Never Used  Substance and Sexual Activity   Alcohol use: Not Currently    Comment: 6-7 months ago   Drug use: Not Currently    Types: Marijuana, Cocaine   Sexual activity: Not Currently  Other Topics Concern   Not on file  Social History Narrative   Lives with brother currently and niece    Social Drivers of Health   Financial Resource Strain: High Risk (07/16/2022)   Overall Financial Resource Strain (CARDIA)    Difficulty of Paying Living Expenses: Very hard  Food Insecurity: No Food Insecurity (07/16/2022)   Hunger Vital Sign    Worried About Running Out of Food in the Last Year: Never true    Ran Out of Food in the Last Year: Never true  Transportation Needs: Unmet Transportation Needs (02/03/2023)   PRAPARE - Administrator, Civil Service (Medical): Yes    Lack of Transportation (Non-Medical): Yes  Physical Activity: Not on file  Stress: Not on file  Social Connections: Not on file  Intimate Partner Violence: Not At Risk (06/17/2022)   Humiliation, Afraid, Rape, and Kick questionnaire    Fear of Current or Ex-Partner: No    Emotionally Abused: No    Physically Abused: No    Sexually Abused: No      Review of Systems  Constitutional:  Positive for fatigue. Negative for chills and unexpected weight change.  HENT:  Negative for congestion, postnasal drip, rhinorrhea, sneezing and sore throat.   Eyes:  Negative for redness.  Respiratory:  Positive for shortness of breath (off and on). Negative for cough and chest tightness.   Cardiovascular:  Positive for leg swelling (significantly improved.).  Negative for chest pain and palpitations.  Gastrointestinal:  Negative for abdominal pain, constipation, diarrhea, nausea and vomiting.  Genitourinary:  Negative for dysuria and frequency.  Musculoskeletal: Negative.  Negative for arthralgias, back pain, joint swelling and neck pain.  Skin:  Negative for rash.  Neurological:  Positive for numbness (and tingling in feet). Negative for tremors.  Hematological:  Negative for adenopathy. Does not bruise/bleed easily.  Psychiatric/Behavioral:  Negative for behavioral problems (Depression), sleep disturbance and suicidal ideas. The patient is not nervous/anxious.     Vital Signs: BP 97/70   Pulse 62   Temp (!) 95.9 F (35.5 C)   Resp 16   Ht 5' 8 (1.727 m)   Wt (!) 335 lb 12.8 oz (152.3 kg)   SpO2 99%   BMI 51.06 kg/m    Physical Exam Vitals reviewed.  Constitutional:      General: She is not in acute distress.    Appearance: Normal appearance. She is obese. She is not ill-appearing.  HENT:     Head: Normocephalic and atraumatic.  Eyes:     Pupils: Pupils are equal, round, and reactive to light.  Cardiovascular:     Rate and Rhythm: Normal rate. Rhythm irregularly irregular.     Heart sounds: Murmur heard.  Pulmonary:     Effort: Pulmonary effort is normal. No accessory muscle usage or respiratory distress.     Breath sounds: Normal breath sounds and air entry.  Musculoskeletal:     Right lower leg: 1+ Edema present.     Left lower leg: 1+ Edema present.  Skin:    Capillary Refill: Capillary refill takes less than 2 seconds.  Neurological:     Mental Status: She is alert and oriented to person, place, and time.  Psychiatric:        Mood and Affect: Mood normal.        Behavior: Behavior normal.        Assessment/Plan: 1. Atrial flutter, unspecified type (HCC) Continue metoprolol  and eliquis  as prescribed.  - apixaban  (ELIQUIS ) 5 MG TABS tablet; Take 1 tablet (5 mg total) by mouth 2 (two) times daily.  Dispense: 180  tablet; Refill: 3  2. Essential hypertension Stable, continue medications as prescribed.  - triamterene -hydrochlorothiazide  (MAXZIDE -25) 37.5-25 MG tablet; Take 1 tablet by mouth daily.  Dispense: 90 tablet; Refill: 2 - potassium chloride  SA (KLOR-CON  M) 20 MEQ tablet; Take 1 tablet (20 mEq total) by mouth daily.  Dispense: 90 tablet; Refill: 2  3. Bilateral lower extremity edema Continue medications as prescribed.  - triamterene -hydrochlorothiazide  (MAXZIDE -25) 37.5-25 MG tablet; Take 1 tablet by mouth daily.  Dispense: 90 tablet; Refill: 2  4. Need for vaccination - pneumococcal 20-valent conjugate vaccine (PREVNAR 20 ) 0.5 ML injection; Inject 0.5 mLs into the muscle once as needed for up to 1 dose for immunization.  Dispense: 0.5 mL; Refill: 0   General Counseling: Ann verbalizes understanding of the findings of todays visit and agrees with plan of treatment. I have discussed any further diagnostic evaluation that may be needed or ordered today. We also reviewed her medications today. she has been encouraged to call the office with any questions or concerns that should arise related to todays visit.    No orders of the defined types were placed in this encounter.   Meds ordered this encounter  Medications   pneumococcal 20-valent conjugate vaccine (PREVNAR 20 ) 0.5 ML injection    Sig: Inject 0.5 mLs into the muscle once as needed for up to 1 dose for immunization.    Dispense:  0.5 mL    Refill:  0    Due for prevnar 20    apixaban  (ELIQUIS ) 5 MG TABS tablet    Sig: Take 1 tablet (5 mg total) by mouth 2 (two) times daily.    Dispense:  180 tablet    Refill:  3    For future refills, keep on file   triamterene -hydrochlorothiazide  (MAXZIDE -25) 37.5-25 MG tablet    Sig: Take 1 tablet by mouth daily.    Dispense:  90 tablet    Refill:  2    For future refills, keep on file   potassium chloride  SA (KLOR-CON  M) 20 MEQ tablet    Sig: Take 1 tablet (20 mEq total) by mouth daily.     Dispense:  90 tablet    Refill:  2    For future refills, keep on file    Return in about 6 months (around 02/12/2025) for CPE, Clif Serio PCP after 02/10/25 and otherwise  as needed .   Total time spent:30 Minutes Time spent includes review of chart, medications, test results, and follow up plan with the patient.   [place] Controlled Substance Database was reviewed by me.  This patient was seen by Mardy Maxin, FNP-C in collaboration with Dr. Sigrid Bathe as a part of collaborative care agreement.   Sweden Lesure R. Maxin, MSN, FNP-C Internal medicine

## 2024-08-30 ENCOUNTER — Telehealth: Payer: Self-pay | Admitting: *Deleted

## 2024-08-30 NOTE — Telephone Encounter (Signed)
 Created in error

## 2024-09-17 DIAGNOSIS — I4892 Unspecified atrial flutter: Secondary | ICD-10-CM

## 2024-09-18 ENCOUNTER — Ambulatory Visit: Payer: Self-pay | Admitting: Cardiology

## 2024-09-19 NOTE — Telephone Encounter (Signed)
 Pt returning call

## 2024-09-19 NOTE — Telephone Encounter (Signed)
-----   Message from [place] JONELLE Cable sent at 09/19/2024 11:31 AM EST -----

## 2024-09-19 NOTE — Telephone Encounter (Signed)
 I spoke with Lauren Hensley in regards to the heart monitor results. No further questions in regards to results. Patient states feel better with the increased dose of 50 mg metoprolol . Suzann Riddle, NP notified.

## 2024-09-19 NOTE — Telephone Encounter (Signed)
-----   Message from Chantal Needle, NP sent at 09/18/2024  8:21 AM EST ----- Zio monitor showed frequent SVT episodes, fast heart rhythm that begins in the top chambers of the heart. No atrial fibrillation detected. Symptom triggered corresponded with ectopic atrial rhythm vs competing junctional rhythm, bursts of SVT.   At her last visit I had increased her Toprol  to 50 mg daily.  If she feeling any better with this dose increase?

## 2024-09-19 NOTE — Telephone Encounter (Signed)
 I left a voicemail for Lauren Hensley with an update from Lauren Riddle, Lauren Hensley in regards to the recent Zio heart monitor. I read the My Chart message as follows:  Zio monitor showed frequent SVT episodes, fast heart rhythm that begins in the top chambers of the heart. No atrial fibrillation detected. Symptom triggered corresponded with ectopic atrial rhythm vs competing junctional rhythm, bursts of SVT.    I asked that Lauren Hensley call or My Chart message us  to let us  know if there was any improvement with the higher dose of metoprolol  that was increased to 50 mg daily at the last visit.

## 2024-09-22 ENCOUNTER — Encounter: Payer: Self-pay | Admitting: Nurse Practitioner

## 2024-09-27 ENCOUNTER — Encounter: Payer: Self-pay | Admitting: Internal Medicine

## 2024-09-27 ENCOUNTER — Telehealth: Payer: Self-pay | Admitting: Cardiology

## 2024-09-27 ENCOUNTER — Other Ambulatory Visit: Payer: Self-pay

## 2024-09-27 DIAGNOSIS — I10 Essential (primary) hypertension: Secondary | ICD-10-CM

## 2024-09-27 DIAGNOSIS — I4892 Unspecified atrial flutter: Secondary | ICD-10-CM

## 2024-09-27 NOTE — Telephone Encounter (Signed)
" °*  STAT* If patient is at the pharmacy, call can be transferred to refill team.   1. Which medications need to be refilled? (please list name of each medication and dose if known) metoprolol  succinate (TOPROL -XL) 50 MG 24 hr tablet  2. Which pharmacy/location (including street and city if local pharmacy) is medication to be sent to? [facility]  3. Do they need a 30 day or 90 day supply? 90 day supply "

## 2024-09-29 ENCOUNTER — Other Ambulatory Visit: Payer: Self-pay

## 2024-09-29 NOTE — Telephone Encounter (Signed)
 Patient is following up. She says she is completely out of medication.

## 2024-10-02 ENCOUNTER — Other Ambulatory Visit: Payer: Self-pay

## 2024-10-02 MED ORDER — METOPROLOL SUCCINATE ER 50 MG PO TB24
50.0000 mg | ORAL_TABLET | Freq: Every day | ORAL | 2 refills | Status: AC
  Filled 2024-10-02: qty 90, 90d supply, fill #0

## 2024-10-02 NOTE — Telephone Encounter (Signed)
 Pt's medication was sent to pt's pharmacy as requested. Confirmation received.

## 2024-10-03 ENCOUNTER — Other Ambulatory Visit: Payer: Self-pay

## 2024-10-25 NOTE — Progress Notes (Unsigned)
 "     Electrophysiology Clinic Note    Date:  10/26/2024  Patient ID:  Lauren Hensley, Rajkumar Aug 21, 1966, MRN [id number] PCP:  Liana Fish, NP  Cardiologist:  Redell Cave, MD  Electrophysiologist:  Fonda Kitty, MD  Electrophysiology APP:  Sherrise Liberto, NP    Discussed the use of AI scribe software for clinical note transcription with the patient, who gave verbal consent to proceed.   Patient Profile    Chief Complaint: Aflutter follow-up  History of Present Illness: Lauren Hensley is a 59 y.o. female with PMH notable for parox AFib, aflutter, HTN, IDA; seen today for Fonda Kitty, MD (Previously Dr. Cindie) for routine electrophysiology followup.   She is s/p DCCV 05/2023 where she converted to NSR initially, but had IRAF.  She last saw Dr. Cindie 06/2023 for EP evaluation of Aflutter, where she had converted to sinus rhythm. Not invasive EP procedural candidate d/t BMI.  I last saw her 06/2024 where she was having fluttering and zinging sensations in her chest.  Toprol  increased to 50 mg daily and I ordered a updated Zio monitor to further evaluate these episodes which showed frequent SVT episodes, longest 5 minutes.  Today, she continues to have zings in her chest but they have overall improved with the increase toprol  dose. Her symptoms seem to fluctuate day to day, she is overall feeling better since our last appointment though admits to still having some bad days with palpitations, SOB with activity.  She continues to take eliquis  BID, no bleeding concerns.   She does not endorse snoring, but does occasionally nod off while sitting still doing an activity. Does wake up with morning HA occasionally. No witnessed apneic episodes.      Arrhythmia/Device History No specialty comments available.     ROS:  Please see the history of present illness. All other systems are reviewed and otherwise negative.    Physical Exam    VS:  BP 112/80 (BP Location: Left  Arm, Patient Position: Sitting, Cuff Size: Large)   Pulse 76   Ht 5' 8 (1.727 m)   Wt (!) 336 lb 4 oz (152.5 kg)   SpO2 98%   BMI 51.13 kg/m  BMI: Body mass index is 51.13 kg/m.    STOP-Bang Score:  5         Wt Readings from Last 3 Encounters:  10/26/24 (!) 336 lb 4 oz (152.5 kg)  08/15/24 (!) 335 lb 12.8 oz (152.3 kg)  07/25/24 (!) 332 lb (150.6 kg)     GEN- The patient is well appearing, alert and oriented x 3 today.   Lungs- Diminished throughout, normal work of breathing.  Heart- Irregularly irregular rate and rhythm, no murmurs, rubs or gallops Extremities- Trace peripheral edema, warm, dry   Studies Reviewed   Previous EP, cardiology notes.    EKG is ordered. Personal review of EKG from today shows:    EKG Interpretation Date/Time:  Thursday October 26 2024 13:25:41 EST Ventricular Rate:  76 PR Interval:    QRS Duration:  88 QT Interval:  404 QTC Calculation: 454 R Axis:   -1  Text Interpretation: sinus rhythm with marked Sinus arrhythmia Low voltage QRS Nonspecific ST and T wave abnormality Confirmed by Samar Venneman [phone]) on 10/26/2024 2:06:09 PM    Long term monitor, 09/11/2024 HR 29 - 176, average 69 bpm.  >10,000 SVT episodes, longest lasting 5 minutes and 58 seconds. No atrial fibrillation detected. Rare supraventricular ectopy. Rare ventricular ectopy. Symptom trigger episodes correspond  to ectopic atrial rhythm vs competing junctional rhythm, bursts of SVT. Difficulty discerning atrial activity making definitive diagnosis difficult to ascertain.  Long term monitor, 08/10/2023 HR 37 - 139 bpm, average 53 bpm. 122 SVT episodes, longest 52.1 seconds. Rhythm strip suggests atrial tachycardia. Rare supraventricular and ventricular ectopy. No atrial fibrillation.   TTE, 07/16/2022  1. Left ventricular ejection fraction, by estimation, is 55 to 60%. The left ventricle has normal function. The left ventricle has no regional wall motion abnormalities.  There is mild left ventricular hypertrophy. Left ventricular diastolic parameters were normal.   2. Right ventricular systolic function is normal. The right ventricular size is normal. There is normal pulmonary artery systolic pressure. The estimated right ventricular systolic pressure is 27.3 mmHg.   3. The mitral valve is normal in structure. No evidence of mitral valve regurgitation. No evidence of mitral stenosis.   4. Tricuspid valve regurgitation is moderate.   5. The aortic valve is tricuspid. Aortic valve regurgitation is not visualized. No aortic stenosis is present.   6. The inferior vena cava is normal in size with greater than 50% respiratory variability, suggesting right atrial pressure of 3 mmHg.    Assessment and Plan     #) Aflutter #) atrial tach Today's EKG is grossly similar to previous and to ambulatory monitor She overall feels better on 50mg  toprol , will continue Update TTE to evaluate LVEF If EF abnormal, consider AAD  #) Hypercoag d/t aflutter  CHA2DS2-VASc Score = at least 2 [CHF History: 0, HTN History: 1, Diabetes History: 0, Stroke History: 0, Vascular Disease History: 0, Age Score: 0, Gender Score: 1].  Therefore, the patient's annual risk of stroke is 2.2 %.    Stroke ppx - 5mg  eliquis  BID, appropriately dosed No bleeding concerns   #) sleep disordered breathing StopBang score = 5 Will refer to pulm for OSA evaluation/treatment     Current medicines are reviewed at length with the patient today.   The patient does not have concerns regarding her medicines.  The following changes were made today:   none  Labs/ tests ordered today include:  Orders Placed This Encounter  Procedures   Ambulatory referral to Pulmonology   EKG 12-Lead   ECHOCARDIOGRAM COMPLETE     Disposition: Follow up with Dr. Kennyth or EP APP in 6 months, sooner if needed   Signed, Chantal Needle, NP  10/26/24  3:50 PM  Electrophysiology [facility] "

## 2024-10-26 ENCOUNTER — Encounter: Payer: Self-pay | Admitting: Cardiology

## 2024-10-26 ENCOUNTER — Ambulatory Visit: Attending: Cardiology | Admitting: Cardiology

## 2024-10-26 VITALS — BP 112/80 | HR 76 | Ht 68.0 in | Wt 336.2 lb

## 2024-10-26 DIAGNOSIS — D6869 Other thrombophilia: Secondary | ICD-10-CM | POA: Insufficient documentation

## 2024-10-26 DIAGNOSIS — G4730 Sleep apnea, unspecified: Secondary | ICD-10-CM | POA: Insufficient documentation

## 2024-10-26 DIAGNOSIS — I4892 Unspecified atrial flutter: Secondary | ICD-10-CM | POA: Diagnosis present

## 2024-10-26 DIAGNOSIS — I471 Supraventricular tachycardia, unspecified: Secondary | ICD-10-CM | POA: Diagnosis present

## 2024-10-26 NOTE — Patient Instructions (Addendum)
 Medication Instructions:  Your physician recommends that you continue on your current medications as directed. Please refer to the Current Medication list given to you today.  *If you need a refill on your cardiac medications before your next appointment, please call your pharmacy*  Lab Work: No labs ordered today    Testing/Procedures: Your physician has requested that you have an echocardiogram. Echocardiography is a painless test that uses sound waves to create images of your heart. It provides your doctor with information about the size and shape of your heart and how well your hearts chambers and valves are working.   You may receive an ultrasound enhancing agent through an IV if needed to better visualize your heart during the echo. This procedure takes approximately one hour.  There are no restrictions for this procedure.  This will take place at [address] [facility] [address]) [address], [place] [address]  Please note: We ask at that you not bring children with you during ultrasound (echo/ vascular) testing. Due to room size and safety concerns, children are not allowed in the ultrasound rooms during exams. Our front office staff cannot provide observation of children in our lobby area while testing is being conducted. An adult accompanying a patient to their appointment will only be allowed in the ultrasound room at the discretion of the ultrasound technician under special circumstances. We apologize for any inconvenience.   Follow-Up:  Continue Home Blood Pressure Logs.  Referral sent to Pulmonology for Sleep Study.    At [facility], you and your health needs are our priority.  As part of our continuing mission to provide you with exceptional heart care, our providers are all part of one team.  This team includes your primary Cardiologist (physician) and Advanced Practice Providers or APPs (Physician Assistants and Nurse Practitioners) who all work together to  provide you with the care you need, when you need it.  Your next appointment:   6 month(s)  Provider:   Dr. Fonda Kitty

## 2024-11-01 NOTE — Telephone Encounter (Signed)
 LMTCB. E2C2 please advise when patient calls back.

## 2024-11-02 ENCOUNTER — Encounter: Payer: Self-pay | Admitting: Sleep Medicine

## 2024-11-02 ENCOUNTER — Ambulatory Visit: Admitting: Sleep Medicine

## 2024-11-02 VITALS — BP 108/70 | HR 76 | Temp 97.6°F | Ht 68.0 in | Wt 338.0 lb

## 2024-11-02 DIAGNOSIS — G4733 Obstructive sleep apnea (adult) (pediatric): Secondary | ICD-10-CM

## 2024-11-02 DIAGNOSIS — I509 Heart failure, unspecified: Secondary | ICD-10-CM

## 2024-11-02 DIAGNOSIS — I10 Essential (primary) hypertension: Secondary | ICD-10-CM

## 2024-11-02 NOTE — Progress Notes (Unsigned)
 "      Name:Lauren Hensley MRN: [id number] DOB: 04/12/1966   CHIEF COMPLAINT:  EXCESSIVE DAYTIME SLEEPINESS   HISTORY OF PRESENT ILLNESS: Lauren Hensley is a 59 y.o. w/ a h/o HTN, CHF, atrial fibrillation and morbid obesity who present for c/o excessive daytime sleepiness which has been present for several years. Reports nocturnal awakenings due to nocturia and occasionally has difficulty falling back to sleep. Denies any significant weight changes. Denies morning headaches, RLS symptoms, dream enactment, cataplexy, hypnagogic or hypnapompic hallucinations. Denies a family history of sleep apnea. Denies drowsy driving. Drinks tea and coffee occasionally, denies alcohol, tobacco or denies illicit drug use.   Bedtime 11:30 pm-12:30 am Sleep onset 10 mins Rise time 8 am   EPWORTH SLEEP SCORE 4   PAST MEDICAL HISTORY :   has a past medical history of Anemia, CHF (congestive heart failure) (HCC), Clotting disorder, Headache (06/17/2022), Heart murmur, and Hypertension.  has a past surgical history that includes Esophagogastroduodenoscopy (N/A, 02/08/2021); Colonoscopy with propofol  (N/A, 02/08/2021); Givens capsule study (N/A, 02/09/2021); EMBOLIZATION (N/A, 07/29/2021); EMBOLIZATION (N/A, 09/02/2021); Emobolization (09/02/2021); and Cardioversion (N/A, 06/28/2023). Prior to Admission medications  Medication Sig Start Date End Date Taking? Authorizing Provider  apixaban  (ELIQUIS ) 5 MG TABS tablet Take 1 tablet (5 mg total) by mouth 2 (two) times daily. 08/15/24  Yes Abernathy, Mardy, NP  Blood Pressure Monitoring (OMRON 3 SERIES BP MONITOR) DEVI Check blood pressure as instructed by your physician 07/25/24  Yes   Fe Bisgly-Vit C-Vit B12-FA 28-60-0.008-0.4 MG CAPS Take 2 tablets by mouth daily. 07/05/24  Yes Brahmanday, Govinda R, MD  metoprolol  succinate (TOPROL -XL) 50 MG 24 hr tablet Take 1 tablet (50 mg total) by mouth daily. 10/02/24  Yes Riddle, Suzann, NP  pneumococcal 20-valent conjugate  vaccine (PREVNAR 20 ) 0.5 ML injection Inject 0.5 mLs into the muscle once as needed for up to 1 dose for immunization. 08/15/24  Yes Abernathy, Mardy, NP  potassium chloride  SA (KLOR-CON  M) 20 MEQ tablet Take 1 tablet (20 mEq total) by mouth daily. 08/15/24  Yes Abernathy, Mardy, NP  triamterene -hydrochlorothiazide  (MAXZIDE -25) 37.5-25 MG tablet Take 1 tablet by mouth daily. 08/15/24  Yes Liana Mardy, NP   Allergies[1]  FAMILY HISTORY:  family history includes Arthritis in her maternal aunt; COPD in her mother; Diabetes in her maternal aunt and mother; Emphysema in her father; Heart Problems in her mother; Heart disease in her mother; Heart failure in her mother; Hypercholesterolemia in her brother; Obesity in her maternal aunt. SOCIAL HISTORY:  reports that she has never smoked. She has never used smokeless tobacco. She reports that she does not currently use alcohol. She reports that she does not currently use drugs after having used the following drugs: Marijuana and Cocaine.   Review of Systems:  Gen:  Denies  fever, sweats, chills weight loss  HEENT: Denies blurred vision, double vision, ear pain, eye pain, hearing loss, nose bleeds, sore throat Cardiac:  No dizziness, chest pain or heaviness, chest tightness,edema, No JVD Resp:   No cough, -sputum production, -shortness of breath,-wheezing, -hemoptysis,  Gi: Denies swallowing difficulty, stomach pain, nausea or vomiting, diarrhea, constipation, bowel incontinence Gu:  Denies bladder incontinence, burning urine Ext:   Denies Joint pain, stiffness or swelling Skin: Denies  skin rash, easy bruising or bleeding or hives Endoc:  Denies polyuria, polydipsia , polyphagia or weight change Psych:   Denies depression, insomnia or hallucinations  Other:  All other systems negative  VITAL SIGNS: BP 108/70   Pulse 76  Temp 97.6 F (36.4 C)   Ht 5' 8 (1.727 m)   Wt (!) 338 lb (153.3 kg)   SpO2 96%   BMI 51.39 kg/m    Physical  Examination:   General Appearance: No distress  EYES PERRLA, EOM intact.   NECK Supple, No JVD Pulmonary: normal breath sounds, No wheezing.  CardiovascularNormal S1,S2.  No m/r/g.   Abdomen: Benign, Soft, non-tender. Skin:   warm, no rashes, no ecchymosis  Extremities: normal, no cyanosis, clubbing. Neuro:without focal findings,  speech normal  PSYCHIATRIC: Mood, affect within normal limits.   ASSESSMENT AND PLAN  OSA I suspect that OSA is likely present due to clinical presentation. Discussed the consequences of untreated sleep apnea. Advised not to drive drowsy for safety of patient and others. Will complete further evaluation with a split night study and follow up to review results.    HTN Stable, on current management. Following with PCP.   CHF Stable, following with cardiology.   Morbid obesity Counseled patient on diet and lifestyle modification.    MEDICATION ADJUSTMENTS/LABS AND TESTS ORDERED: Recommend Sleep Study   Patient  satisfied with Plan of action and management. All questions answered  Follow up to review PSG results and treatment plan.   I spent a total of 30 minutes reviewing chart data, face-to-face evaluation with the patient, counseling and coordination of care as detailed above.    Chidubem Chaires, M.D.  Sleep Medicine [facility]           [1] No Known Allergies  "

## 2024-11-07 ENCOUNTER — Ambulatory Visit

## 2025-01-03 ENCOUNTER — Ambulatory Visit: Admitting: Internal Medicine

## 2025-01-03 ENCOUNTER — Ambulatory Visit

## 2025-01-03 ENCOUNTER — Other Ambulatory Visit

## 2025-02-15 ENCOUNTER — Encounter: Admitting: Nurse Practitioner
